# Patient Record
Sex: Female | Born: 1935 | Race: White | Hispanic: No | Marital: Married | Smoking: Former smoker
Health system: Southern US, Community
[De-identification: ages and names within clinical notes are randomized; demographics above are authoritative.]

## PROBLEM LIST (undated history)

## (undated) DIAGNOSIS — I10 Essential (primary) hypertension: Secondary | ICD-10-CM

## (undated) DIAGNOSIS — R079 Chest pain, unspecified: Secondary | ICD-10-CM

## (undated) DIAGNOSIS — N390 Urinary tract infection, site not specified: Secondary | ICD-10-CM

## (undated) DIAGNOSIS — J984 Other disorders of lung: Secondary | ICD-10-CM

## (undated) DIAGNOSIS — R001 Bradycardia, unspecified: Secondary | ICD-10-CM

## (undated) DIAGNOSIS — M1990 Unspecified osteoarthritis, unspecified site: Secondary | ICD-10-CM

## (undated) DIAGNOSIS — M179 Osteoarthritis of knee, unspecified: Secondary | ICD-10-CM

## (undated) DIAGNOSIS — M1710 Unilateral primary osteoarthritis, unspecified knee: Secondary | ICD-10-CM

## (undated) HISTORY — PX: TUBAL LIGATION: SHX77

## (undated) HISTORY — PX: ABDOMINAL HYSTERECTOMY: SHX81

## (undated) HISTORY — PX: TOTAL KNEE ARTHROPLASTY: SHX125

---

## 2001-10-17 ENCOUNTER — Encounter: Payer: Self-pay | Admitting: Orthopedic Surgery

## 2001-10-17 ENCOUNTER — Encounter: Admission: RE | Admit: 2001-10-17 | Discharge: 2001-10-17 | Payer: Self-pay | Admitting: Orthopedic Surgery

## 2001-10-20 ENCOUNTER — Ambulatory Visit (HOSPITAL_BASED_OUTPATIENT_CLINIC_OR_DEPARTMENT_OTHER): Admission: RE | Admit: 2001-10-20 | Discharge: 2001-10-20 | Payer: Self-pay | Admitting: Orthopedic Surgery

## 2002-05-13 ENCOUNTER — Encounter: Admission: RE | Admit: 2002-05-13 | Discharge: 2002-06-05 | Payer: Self-pay

## 2010-08-14 ENCOUNTER — Encounter: Payer: Self-pay | Admitting: Unknown Physician Specialty

## 2015-09-14 ENCOUNTER — Encounter (HOSPITAL_COMMUNITY): Payer: Self-pay | Admitting: Emergency Medicine

## 2015-09-14 ENCOUNTER — Emergency Department (HOSPITAL_COMMUNITY): Payer: Medicare HMO

## 2015-09-14 ENCOUNTER — Emergency Department (HOSPITAL_COMMUNITY)
Admission: EM | Admit: 2015-09-14 | Discharge: 2015-09-15 | Disposition: A | Discharge status: Home / Self Care | Payer: Medicare HMO | Attending: Emergency Medicine | Admitting: Emergency Medicine

## 2015-09-14 DIAGNOSIS — R600 Localized edema: Secondary | ICD-10-CM | POA: Insufficient documentation

## 2015-09-14 DIAGNOSIS — J209 Acute bronchitis, unspecified: Secondary | ICD-10-CM | POA: Diagnosis not present

## 2015-09-14 DIAGNOSIS — Z590 Homelessness: Secondary | ICD-10-CM | POA: Insufficient documentation

## 2015-09-14 DIAGNOSIS — M79662 Pain in left lower leg: Secondary | ICD-10-CM | POA: Diagnosis present

## 2015-09-14 HISTORY — DX: Unspecified osteoarthritis, unspecified site: M19.90

## 2015-09-14 LAB — CBC WITH DIFFERENTIAL/PLATELET
Basophils Absolute: 0 10*3/uL (ref 0.0–0.1)
Basophils Relative: 0 %
Eosinophils Absolute: 0.1 10*3/uL (ref 0.0–0.7)
Eosinophils Relative: 2 %
HCT: 41.6 % (ref 36.0–46.0)
Hemoglobin: 13.1 g/dL (ref 12.0–15.0)
Lymphocytes Relative: 25 %
Lymphs Abs: 1.5 10*3/uL (ref 0.7–4.0)
MCH: 30.7 pg (ref 26.0–34.0)
MCHC: 31.5 g/dL (ref 30.0–36.0)
MCV: 97.4 fL (ref 78.0–100.0)
Monocytes Absolute: 0.9 10*3/uL (ref 0.1–1.0)
Monocytes Relative: 15 %
Neutro Abs: 3.5 10*3/uL (ref 1.7–7.7)
Neutrophils Relative %: 58 %
Platelets: 241 10*3/uL (ref 150–400)
RBC: 4.27 MIL/uL (ref 3.87–5.11)
RDW: 14 % (ref 11.5–15.5)
WBC: 5.9 10*3/uL (ref 4.0–10.5)

## 2015-09-14 LAB — COMPREHENSIVE METABOLIC PANEL
ALT: 13 U/L — ABNORMAL LOW (ref 14–54)
AST: 25 U/L (ref 15–41)
Albumin: 4.1 g/dL (ref 3.5–5.0)
Alkaline Phosphatase: 53 U/L (ref 38–126)
Anion gap: 10 (ref 5–15)
BUN: 13 mg/dL (ref 6–20)
CO2: 26 mmol/L (ref 22–32)
Calcium: 9.3 mg/dL (ref 8.9–10.3)
Chloride: 104 mmol/L (ref 101–111)
Creatinine, Ser: 0.78 mg/dL (ref 0.44–1.00)
GFR calc Af Amer: >60 mL/min (ref >60)
GFR calc non Af Amer: >60 mL/min (ref >60)
Glucose, Bld: 60 mg/dL — ABNORMAL LOW (ref 65–99)
Potassium: 3.6 mmol/L (ref 3.5–5.1)
Sodium: 140 mmol/L (ref 135–145)
Total Bilirubin: 0.6 mg/dL (ref 0.3–1.2)
Total Protein: 7.2 g/dL (ref 6.5–8.1)

## 2015-09-14 LAB — URINALYSIS, DIPSTICK ONLY
Bilirubin Urine: NEGATIVE
Glucose, UA: NEGATIVE mg/dL
Ketones, ur: NEGATIVE mg/dL
Leukocytes, UA: NEGATIVE
Nitrite: NEGATIVE
Protein, ur: NEGATIVE mg/dL
Specific Gravity, Urine: 1.005 (ref 1.005–1.030)
pH: 7 (ref 5.0–8.0)

## 2015-09-14 LAB — BRAIN NATRIURETIC PEPTIDE: B Natriuretic Peptide: 72.4 pg/mL (ref 0.0–100.0)

## 2015-09-14 LAB — I-STAT TROPONIN, ED: Troponin i, poc: 0 ng/mL (ref 0.00–0.08)

## 2015-09-14 MED ORDER — AZITHROMYCIN 250 MG PO TABS: 250.0000 mg | ORAL_TABLET | Freq: Every day | ORAL | Status: DC

## 2015-09-14 MED ORDER — AZITHROMYCIN 250 MG PO TABS
500.0000 mg | ORAL_TABLET | Freq: Once | ORAL | Status: AC
  Administered 2015-09-14: 500 mg via ORAL
  Filled 2015-09-14: qty 2

## 2015-09-14 NOTE — ED Notes (Signed)
Case management at bedside.

## 2015-09-14 NOTE — ED Notes (Signed)
MD at bedside. 

## 2015-09-14 NOTE — ED Provider Notes (Signed)
CSN: [id number]     Arrival date & time 09/14/15  1113 History   First MD Initiated Contact with Patient 09/14/15 1624     Chief Complaint  Patient presents with  . Ankle Pain     Patient is a 82 y.o. female presenting with ankle pain. The history is provided by the patient. No language interpreter was used.  Ankle Pain  YINA RIVIERE is a 81 y.o. female who presents to the Emergency Department complaining of cough, leg swelling. She reports 1 year of bilateral lower extremity edema. The swelling spell worse over the last week. She reports increased pain in bilateral legs due to swelling. She also reports cough and chest congestion since last night. No reports of fevers, chest pain, belly pain, vomiting. She denies any medical history. She takes no medications. Sxs are moderate, constant, worsening..   Past Medical History  Diagnosis Date  . Arthritis    No past surgical history on file. No family history on file. Social History  Substance Use Topics  . Smoking status: None  . Smokeless tobacco: None  . Alcohol Use: No   OB History    No data available     Review of Systems  All other systems reviewed and are negative.     Allergies  Ciprofloxacin hcl; Prednisone; and Zantac  Home Medications   Prior to Admission medications   Medication Sig Start Date End Date Taking? Authorizing Provider  azithromycin (ZITHROMAX) 250 MG tablet Take 1 tablet (250 mg total) by mouth daily. 09/14/15   Tilden Fossa, MD   BP 143/62 mmHg  Pulse 76  Temp(Src) 98 F (36.7 C) (Oral)  Resp 16  SpO2 94% Physical Exam  Constitutional: She is oriented to person, place, and time. She appears well-developed and well-nourished.  HENT:  Head: Normocephalic and atraumatic.  Cardiovascular: Normal rate and regular rhythm.   No murmur heard. Pulmonary/Chest: Effort normal and breath sounds normal. No respiratory distress.  Abdominal: Soft. There is no tenderness. There is no rebound and no  guarding.  Musculoskeletal: She exhibits no tenderness.  1-2+ pitting edema bilateral lower extremities  Neurological: She is alert and oriented to person, place, and time.  Skin: Skin is warm and dry.  Psychiatric: She has a normal mood and affect. Her behavior is normal.  Nursing note and vitals reviewed.   ED Course  Procedures (including critical care time) Labs Review Labs Reviewed  COMPREHENSIVE METABOLIC PANEL - Abnormal; Notable for the following:    Glucose, Bld 60 (*)    ALT 13 (*)    All other components within normal limits  URINALYSIS, DIPSTICK ONLY - Abnormal; Notable for the following:    Hgb urine dipstick TRACE (*)    All other components within normal limits  CBC WITH DIFFERENTIAL/PLATELET  BRAIN NATRIURETIC PEPTIDE  I-STAT TROPOININ, ED    Imaging Review Dg Chest 2 View  09/14/2015  CLINICAL DATA:  Dry cough and shortness of breath beginning last night. Ankle swelling. Initial encounter. EXAM: CHEST  2 VIEW COMPARISON:  None. FINDINGS: The lungs are clear. Heart size is normal. There is no pneumothorax or pleural effusion. No focal bony abnormality. IMPRESSION: No acute disease. Electronically Signed   By: Drusilla Kanner M.D.   On: 09/14/2015 18:13   I have personally reviewed and evaluated these images and lab results as part of my medical decision-making.   EKG Interpretation   Date/Time:  Wednesday September 14 2015 18:26:21 EST Ventricular Rate:  72 PR  Interval:  169 QRS Duration: 87 QT Interval:  409 QTC Calculation: 448 R Axis:   22 Text Interpretation:  Sinus rhythm Low voltage, precordial leads Confirmed  by Lincoln Brigham [phone]) on 09/14/2015 6:43:17 PM      MDM   Final diagnoses:  Acute bronchitis, unspecified organism  Pedal edema    Patient here with cough and congestion, 1 year of chronic bilateral lower extremity edema. She is nontoxic appearing on examination with no respiratory distress. Presentation is not consistent with PE, ACS,  pneumonia. Treating for bronchitis.  In terms of bilateral lower extremity edema, presentation is not consistent with congestive heart failure, PE, cellulitis. Discussed home care, outpatient follow-up, return precautions. Patient is homeless and is unable to return to the shelter tonight, will await social work evaluation in the morning for assistance.    Tilden Fossa, MD 09/15/15 [phone]

## 2015-09-14 NOTE — Discharge Instructions (Signed)
Acute Bronchitis °Bronchitis is inflammation of the airways that extend from the windpipe into the lungs (bronchi). The inflammation often causes mucus to develop. This leads to a cough, which is the most common symptom of bronchitis.  °In acute bronchitis, the condition usually develops suddenly and goes away over time, usually in a couple weeks. Smoking, allergies, and asthma can make bronchitis worse. Repeated episodes of bronchitis may cause further lung problems.  °CAUSES °Acute bronchitis is most often caused by the same virus that causes a cold. The virus can spread from person to person (contagious) through coughing, sneezing, and touching contaminated objects. °SIGNS AND SYMPTOMS  °· Cough.   °· Fever.   °· Coughing up mucus.   °· Body aches.   °· Chest congestion.   °· Chills.   °· Shortness of breath.   °· Sore throat.   °DIAGNOSIS  °Acute bronchitis is usually diagnosed through a physical exam. Your health care provider will also ask you questions about your medical history. Tests, such as chest X-rays, are sometimes done to rule out other conditions.  °TREATMENT  °Acute bronchitis usually goes away in a couple weeks. Oftentimes, no medical treatment is necessary. Medicines are sometimes given for relief of fever or cough. Antibiotic medicines are usually not needed but may be prescribed in certain situations. In some cases, an inhaler may be recommended to help reduce shortness of breath and control the cough. A cool mist vaporizer may also be used to help thin bronchial secretions and make it easier to clear the chest.  °HOME CARE INSTRUCTIONS °· Get plenty of rest.   °· Drink enough fluids to keep your urine clear or pale yellow (unless you have a medical condition that requires fluid restriction). Increasing fluids may help thin your respiratory secretions (sputum) and reduce chest congestion, and it will prevent dehydration.   °· Take medicines only as directed by your health care provider. °· If  you were prescribed an antibiotic medicine, finish it all even if you start to feel better. °· Avoid smoking and secondhand smoke. Exposure to cigarette smoke or irritating chemicals will make bronchitis worse. If you are a smoker, consider using nicotine gum or skin patches to help control withdrawal symptoms. Quitting smoking will help your lungs heal faster.   °· Reduce the chances of another bout of acute bronchitis by washing your hands frequently, avoiding people with cold symptoms, and trying not to touch your hands to your mouth, nose, or eyes.   °· Keep all follow-up visits as directed by your health care provider.   °SEEK MEDICAL CARE IF: °Your symptoms do not improve after 1 week of treatment.  °SEEK IMMEDIATE MEDICAL CARE IF: °· You develop an increased fever or chills.   °· You have chest pain.   °· You have severe shortness of breath. °· You have bloody sputum.   °· You develop dehydration. °· You faint or repeatedly feel like you are going to pass out. °· You develop repeated vomiting. °· You develop a severe headache. °MAKE SURE YOU:  °· Understand these instructions. °· Will watch your condition. °· Will get help right away if you are not doing well or get worse. °  °This information is not intended to replace advice given to you by your health care provider. Make sure you discuss any questions you have with your health care provider. °  °Document Released: 08/16/2004 Document Revised: 07/30/2014 Document Reviewed: 12/30/2012 °Elsevier Interactive Patient Education ©2016 Elsevier Inc. ° °Emergency Department Resource Guide °1) Find a Doctor and Pay Out of Pocket °Although you won't have to   find out who is covered by your insurance plan, it is a good idea to ask around and get recommendations. You will then need to call the office and see if the doctor you have chosen will accept you as a new patient and what types of options they offer for patients who are self-pay. Some doctors offer discounts or  will set up payment plans for their patients who do not have insurance, but you will need to ask so you aren't surprised when you get to your appointment. ° °2) Contact Your Local Health Department °Not all health departments have doctors that can see patients for sick visits, but many do, so it is worth a call to see if yours does. If you don't know where your local health department is, you can check in your phone book. The CDC also has a tool to help you locate your state's health department, and many state websites also have listings of all of their local health departments. ° °3) Find a Walk-in Clinic °If your illness is not likely to be very severe or complicated, you may want to try a walk in clinic. These are popping up all over the country in pharmacies, drugstores, and shopping centers. They're usually staffed by nurse practitioners or physician assistants that have been trained to treat common illnesses and complaints. They're usually fairly quick and inexpensive. However, if you have serious medical issues or chronic medical problems, these are probably not your best option. ° °No Primary Care Doctor: °- Call [facility] at  832-8000 - they can help you locate a primary care doctor that  accepts your insurance, provides certain services, etc. °- Physician Referral Service- 1-800-533-3463 ° °Chronic Pain Problems: °Organization         Address  Phone   Notes  ° [facility]  (336) 297-2271 Patients need to be referred by their primary care doctor.  ° °Medication Assistance: °Organization         Address  Phone   Notes  °[place] [facility] [address] °[place], [place] [address] (336) 641-8030 --Must be a resident of [place] °-- Must have NO insurance coverage whatsoever (no Medicaid/ Medicare, etc.) °-- The pt. MUST have a primary care doctor that directs their care regularly and follows them in the community °  °[facility]  (866)  331-1348   °[employer]  (888) 892-1162   ° °Agencies that provide inexpensive medical care: °Organization         Address  Phone   Notes  °[facility]  (336) 832-8035   °[facility]    (336) 832-7272   °[facility] [address] °[place], [place] [address] (336) 832-4777   °[facility] [address], °[place] (336) 271-4999   °[facility]    (336) 373-0678   °[facility]    (336) 272-1050   °[facility] ° [address], [place] Phone:  (336) 832-4444, Fax:  (336) 832-4440 Hours of Operation:  9 am - 6 pm, M-F.  Also accepts Medicaid/Medicare and self-pay.  °[facility] ° [address], [place] Phone: (336) 832-3150, Fax: (336) [phone]. Hours of Operation:  8:30 am - 5:30 pm, M-F.  Also accepts Medicaid and self-pay.  °[facility] [place] [address], [place] Phone: (336) 878-6027   °[facility] [address], [place], [place] (336)723-1848, Ext. 123 Mondays & Thursdays: 7-9 AM.  First 15 patients are seen on a first come, first serve basis. °  ° °Medicaid-accepting [place] Providers: ° °Organization         Address  Phone   Notes  °[facility] [address], [place] (336) 641-2100 Also accepts self-pay patients.  °[facility] [address], [place] ° (336) 856-9996   °[facility] [address], [place] (336) 288-8857   °[facility] [address] [place] [address], [place] (336) 299-7000   °Veita Bland [address], [place]  ° (336) 373-1557 Only accepts [place] Access Medicaid patients after they have their name applied to their card.  ° °Self-Pay (no insurance) in [place]: ° °Organization         Address  Phone   Notes  °Sickle Cell Patients, [facility] [address], [place] (336) 832-1970   °[facility] [address], [place] (336) 832-4400   °[facility] ° [address], [place] (336) 992-4800   °[facility]/Dr. Osei-Bonsu ° [address] [place] [address], [place] or [address], [place] (336) 841-8500 Phone number for both [place] and [place] locations is the same.  °[facility] [address], [place] (336) 299-0000   °[facility] [address] [place] [address], [place] or [address] (336) 852-7530 °(336) 878-2260   °[facility] [address], [place] (336) 350-1642, phone; (336) 294-5005, fax Sees patients 1st and 3rd Saturday of every month.  Must not qualify for public or private insurance (i.e. Medicaid, Medicare, [place] Health Choice, Veterans' Benefits) • Household income should be no more than 200% of the poverty level •The clinic cannot treat you if you are pregnant or think you are pregnant • Sexually transmitted diseases are not treated at the clinic.  ° ° °Dental Care: °Organization         Address  Phone  Notes  °[facility] [facility] [address], [place] (336) 641-6152 Accepts children up to age 21 who are enrolled in Medicaid or [place] Health Choice; pregnant women with a Medicaid card; and children who have applied for Medicaid or [place] Health Choice, but were declined, whose parents can pay a reduced fee at time of service.  °[facility] [place]  [address], [place] (336) 641-7733 Accepts children up to age 21 who are enrolled in Medicaid or [place] Health Choice; pregnant women with a Medicaid card; and children who have applied for Medicaid or [place] Health Choice, but were declined, whose parents can pay a reduced fee at time of service.  °[facility] ° [address], [place] (336)  641-4533 Patients are seen by appointment only. Walk-ins are not accepted. [facility] will see patients 18 years of age and older. °Monday - Tuesday (8am-5pm) °Most Wednesdays (8:30-5pm) °$30 per visit, cash only  °[facility] ° [address], [place] (336) 641-4533 Patients are seen by appointment only. Walk-ins are not accepted. [facility] will see patients 18 years of age and older. °One Wednesday Evening (Monthly: Volunteer Based).  $30 per visit, cash only  °[facility]  (  919) 537-3737 for adults; Children under age 4, call Graduate Pediatric Dentistry at (919) [phone]. Children aged 4-14, please call (919) [phone] to request a pediatric application. ° Dental services are provided in all areas of dental care including fillings, crowns and bridges, complete and partial dentures, implants, gum treatment, root canals, and extractions. Preventive care is also provided. Treatment is provided to both adults and children. °Patients are selected via a lottery and there is often a waiting list. °  °[facility] [address], °[place] ° (336) 763-8833 [email or web address] °  °[facility] [address], [place], [place] (336)723-1848, Ext. 123 Second and Fourth Thursday of each month, opens at 6:30 AM; Clinic ends at 9 AM.  Patients are seen on a first-come first-served basis, and a limited number are seen during each clinic.  ° °[facility] ° [address], [place], [place] (336) 723-7904   Eligibility Requirements °You must have lived in [place], or [place] counties for at least the last three months. °  You cannot be eligible for state or federal sponsored healthcare insurance, including Veterans Administration, Medicaid, or Medicare. °  You generally cannot be eligible for healthcare insurance through your employer.  °  How to apply: °Eligibility screenings are held every Tuesday and Wednesday afternoon  from 1:00 pm until 4:00 pm. You do not need an appointment for the interview!  °[facility] [address], [place], [place] 336-631-2330   °[facility]  336-342-8273   °[facility]  336-703-3100   °[facility]  336-570-6415   ° °Behavioral Health Resources in the Community: °Intensive Outpatient Programs °Organization         Address  Phone  Notes  °[facility] [address], [place], [place] 336-878-6098   °[facility] [address], [place], [place] 336-832-9800   °[facility] [address], [place], [place] ° 336-882-2125   °[facility] [address],  °[place], [place] 1-800-853-5163 or 336-641-4981   °Substance Abuse Resources °Organization         Address  Phone  Notes  °[facility]  336-882-2125   °[facility]  336-784-9470   °[facility]  336-285-9073   °[facility]  336-845-3988   °Residential & Outpatient Substance Abuse Program  1-800-659-3381   °Psychological Services °Organization         Address  Phone  Notes  °[facility]  336- 832-9600   °[facility]  336- 378-7881   °[facility] [address], [place] 1-800-853-5163 or 336-641-4981   ° °Mobile Crisis Teams °Organization         Address  Phone  Notes  °[facility], Mobile Crisis Care Unit  1-877-626-1772   °Assertive °[facility] ° [address] [place], [place] 336-834-9664   °Sharon DeEsch [address] °[place] 336-554-5454   ° °Self-Help/Support Groups °Organization         Address  Phone             Notes  °[facility] - variety of support groups  336- 373-1402 Call for more information  °Narcotics Anonymous (NA), [facility] [address], °[place]  2 meetings at this location  ° °Residential Treatment  Programs °Organization         Address  Phone    Notes  °[facility] [address],    °[place]  1-866-801-8205   °[facility] ° [address], [place], [place] 704-293-8524   °[facility] [address], [place] 336-845-3988 Admissions: 8am-3pm M-F  °[facility] [address]    °[place], [place] 336-841-1104   °[facility] [address], [place], [place] 336-379-7146   °[facility] [address]  °[place], [place] 336-285-9073   °[facility] - Intensive Outpatient [address], [place], [place] 336-852-3033   °[facility] [address]  °[place], [place] 1-877-615-2722 or 336-784-9470   °[facility] [address] [place], [place] 336-227-7417 Accepts Medicaid  °[facility] [address]  °[place] 1-800-659-3381 Substance Abuse/Addiction Treatment  ° °[place] Behavioral Health Resources °Organization         Address  Phone  Notes  °[facility]  (888) 581-9988   °Julie Brannon, PhD [address] [place], [place]   (336) 349-5553 or (336) 951-0000   °[facility]   [address] °[place], [place] (336) 349-4454   °[facility] [address], [place], [place] (336) 342-8316 Insurance/Medicaid/sponsorship through [facility]  °[facility] [address]                                    [place], [place] (336) 342-8316 Therapy/tele-psych/case  °[facility] [address]  ° [place],  (336) 349-2233    °Dr. Arfeen  (336) 349-4544   °[facility] [place]  [employer] [place] [facility] 1) [address],  °2) [address], [place] °3)  [address], [place] (336) 349-3220 °(336) 342-7768 ° °(336) 342-8140   °[place] Child Abuse [facility] (336) 342-1394 or (336) 342-3537 (After Hours)    ° ° °

## 2015-09-14 NOTE — ED Notes (Signed)
Per EMS: [facility] from [facility] [address].  Pt is homeless.  C/o bilateral ankle swelling, peeling, hot to touch, redness.  Pitting edema.

## 2015-09-14 NOTE — Progress Notes (Addendum)
EDCM called for EDSW consult for homeless patient needing pcp, community resources.  EDCM spoke to patient at bedside.  Patient reports she has been homeless for one year, has stayed at multiple shelters.  Patient reports she is staying at the [facility] on [address].  She reports the name of the facility has the name [place] in it.  Patient has [employer].  Patient isware of the [facility], but reports it is difficult for her to get there.  Patient reports she has Medicaid "but they would not give me disability."  Patient reports she gets a SS check for 674 dollars a month.  EDCM informed patient if she has disability she will need to get in touch with her SW from DSS to see if they can assist her in getting into an ALF.  Patient is unsure if she has a Child psychotherapist at the shelter she is staying at now.  EDCM also provided patient with phone number for Medicaid transport, and [facility].  [facility] provided patient with contact information for the [facility].  Discussed patient with EDP and EDRN.  Patient will probably need cab voucher to get back to her shelter.  EDCM unable to find phone number to shelter.  EDCM called AD for SW department. Awaiting call back.  Discussed with charge RN.  EDCM also provided patient with contact information for local food pantries and shelters.  List of indigent clinics also given to patient.  No further EDCM  Needs at this time.  Discussed patient with EDP.  EDCM called information for number to [facility] shelter for women, however they do not have one.  EDCM called the no emergency line [phone] who provided phone number [phone] which is a non working number.  Discussed with ED charge RN.  Due to patient not having safe disposition, will stay overnight and have EDSW see patient in AM.

## 2015-09-15 MED ORDER — ADULT MULTIVITAMIN W/MINERALS CH
1.0000 | ORAL_TABLET | Freq: Once | ORAL | Status: AC
  Administered 2015-09-15: 1 via ORAL
  Filled 2015-09-15: qty 1

## 2015-09-15 MED ORDER — IBUPROFEN 800 MG PO TABS
800.0000 mg | ORAL_TABLET | Freq: Once | ORAL | Status: AC
  Administered 2015-09-15: 800 mg via ORAL
  Filled 2015-09-15: qty 1

## 2015-09-15 NOTE — Progress Notes (Addendum)
Pt is very appreciative of anything done for her. Pt was given a cup of coffee and juice . Pt c/o swelling to both ankles. EDP made aware. Pt is not in any resp. Distress. Pt asked for a vitamin pill and a verbal order was obtained. Pt denies any chest pain.pt ate 75 % of her breakfast. Charge nurse made aware pt will be discharged once social work completes all the paperwork. Pt will go to the [facility] via a cab. (10:35am )Dr Estell Harpin saw the pt. 11:10am -_pt will get  of motrin for foot pain. Pt does have a positice pedal pulse. Pt is angry that she is leaving.pt stated her feet have hurt times 3 years. No abrasions noted. Dr. Estell Harpin wrote the pt a RX for Naprosyn. Pt stated her husband of 42 years walked out and left her destitute. Pt has been homeless for three  Years.

## 2015-09-15 NOTE — Progress Notes (Signed)
CSW staffed with nurse. CSW spoke with patient at bedside with no family present. Patient reports she was evicted over one year ago because her husband walked out on her and he would not pay the mortgage. Patient reports they were married for forty-two years. Patient reports she was arrested because she could not pay the mortgage. Patient reports the judge allowed her husband to have their truck and she stated she was left "deserted".   Patient reports she has been to various shelters and she does not like the way she has been treated. Patient reports the last shelter she was in was in [place]. Patient reports she has received help from [facility], [place]. Patient reports she has also lived in a shelter named "[address]" on [place], [place]. Patient reports she would like a one bedroom apartment.   CSW staffed case with Asst. Social Work Interior and spatial designer. CSW was informed to see if patient is medically cleared and if so, to provide resources with assistance back to the shelter. CSW staffed with nurse and nurse reports patient has been medically cleared. CSW spoke with Asst. Social Work Interior and spatial designer to see if patient would be allowed to take a cab for transportation. He approved for patient to be transported by cab. CSW gave nurse the cab voucher and CSW provided resource information to patient for the [facility] and various homeless shelters.   Elenore Paddy [phone] ED CSW 09/15/2015 12:49 PM

## 2015-10-18 DIAGNOSIS — Z5900 Homelessness unspecified: Secondary | ICD-10-CM

## 2015-10-20 NOTE — Congregational Nurse Program (Unsigned)
Congregational Nurse Program Note  Date of Encounter: 10/18/2015  Past Medical History: No past medical history on file.  Encounter Details:     CNP Questionnaire - 10/18/15 0852    Patient Demographics   Is this a new or existing patient? New   Patient is considered a/an Not Applicable   Race Caucasian/White   Patient Assistance   Location of Patient Assistance Not Applicable   Patient's financial/insurance status Medicaid;Medicare   Uninsured Patient No   Patient referred to apply for the following financial assistance Not Applicable   Food insecurities addressed Provided food supplies   Transportation assistance No   Educational health offerings Chronic disease;Hypertension   Encounter Details   Primary purpose of visit Chronic Illness/Condition Visit;Education/Health Concerns   Was an Emergency Department visit averted? Not Applicable   Does patient have a medical provider? No   Patient referred to Clinic   Was a mental health screening completed? (GAINS tool) No   Does patient have dental issues? No   Does patient have vision issues? No   Since previous encounter, have you referred patient for abnormal blood pressure that resulted in a new diagnosis or medication change? No   Since previous encounter, have you referred patient for abnormal blood glucose that resulted in a new diagnosis or medication change? No   For Abstraction Use Only   Does patient have insurance? Yes       B/P check

## 2015-11-20 ENCOUNTER — Emergency Department (HOSPITAL_COMMUNITY): Payer: Medicare HMO

## 2015-11-20 ENCOUNTER — Encounter (HOSPITAL_COMMUNITY): Payer: Self-pay | Admitting: Emergency Medicine

## 2015-11-20 ENCOUNTER — Emergency Department (HOSPITAL_COMMUNITY)
Admission: EM | Admit: 2015-11-20 | Discharge: 2015-11-20 | Disposition: A | Discharge status: Home / Self Care | Payer: Medicare HMO | Attending: Emergency Medicine | Admitting: Emergency Medicine

## 2015-11-20 DIAGNOSIS — Y999 Unspecified external cause status: Secondary | ICD-10-CM | POA: Diagnosis not present

## 2015-11-20 DIAGNOSIS — Y939 Activity, unspecified: Secondary | ICD-10-CM | POA: Insufficient documentation

## 2015-11-20 DIAGNOSIS — S39012A Strain of muscle, fascia and tendon of lower back, initial encounter: Secondary | ICD-10-CM | POA: Insufficient documentation

## 2015-11-20 DIAGNOSIS — Y9289 Other specified places as the place of occurrence of the external cause: Secondary | ICD-10-CM | POA: Insufficient documentation

## 2015-11-20 DIAGNOSIS — W19XXXA Unspecified fall, initial encounter: Secondary | ICD-10-CM

## 2015-11-20 DIAGNOSIS — W101XXA Fall (on)(from) sidewalk curb, initial encounter: Secondary | ICD-10-CM | POA: Diagnosis not present

## 2015-11-20 DIAGNOSIS — M1990 Unspecified osteoarthritis, unspecified site: Secondary | ICD-10-CM | POA: Diagnosis not present

## 2015-11-20 DIAGNOSIS — S91341A Puncture wound with foreign body, right foot, initial encounter: Secondary | ICD-10-CM | POA: Diagnosis not present

## 2015-11-20 DIAGNOSIS — R1030 Lower abdominal pain, unspecified: Secondary | ICD-10-CM | POA: Diagnosis present

## 2015-11-20 LAB — CBC WITH DIFFERENTIAL/PLATELET
BASOS ABS: 0 10*3/uL (ref 0.0–0.1)
Basophils Relative: 0 %
Eosinophils Absolute: 0.2 10*3/uL (ref 0.0–0.7)
Eosinophils Relative: 2 %
HEMATOCRIT: 39.4 % (ref 36.0–46.0)
Hemoglobin: 13.6 g/dL (ref 12.0–15.0)
LYMPHS PCT: 25 %
Lymphs Abs: 2.8 10*3/uL (ref 0.7–4.0)
MCH: 32.9 pg (ref 26.0–34.0)
MCHC: 34.5 g/dL (ref 30.0–36.0)
MCV: 95.4 fL (ref 78.0–100.0)
Monocytes Absolute: 0.8 10*3/uL (ref 0.1–1.0)
Monocytes Relative: 7 %
NEUTROS ABS: 7.3 10*3/uL (ref 1.7–7.7)
Neutrophils Relative %: 66 %
Platelets: 250 10*3/uL (ref 150–400)
RBC: 4.13 MIL/uL (ref 3.87–5.11)
RDW: 12.9 % (ref 11.5–15.5)
WBC: 11 10*3/uL — AB (ref 4.0–10.5)

## 2015-11-20 LAB — COMPREHENSIVE METABOLIC PANEL
ALBUMIN: 3.8 g/dL (ref 3.5–5.0)
ALT: 14 U/L (ref 14–54)
ANION GAP: 9 (ref 5–15)
AST: 20 U/L (ref 15–41)
Alkaline Phosphatase: 51 U/L (ref 38–126)
BILIRUBIN TOTAL: 0.5 mg/dL (ref 0.3–1.2)
BUN: 11 mg/dL (ref 6–20)
CHLORIDE: 110 mmol/L (ref 101–111)
CO2: 26 mmol/L (ref 22–32)
Calcium: 9.1 mg/dL (ref 8.9–10.3)
Creatinine, Ser: 0.75 mg/dL (ref 0.44–1.00)
GFR calc Af Amer: >60 mL/min (ref >60)
GLUCOSE: 104 mg/dL — AB (ref 65–99)
POTASSIUM: 3.5 mmol/L (ref 3.5–5.1)
Sodium: 145 mmol/L (ref 135–145)
TOTAL PROTEIN: 6.8 g/dL (ref 6.5–8.1)

## 2015-11-20 LAB — URINALYSIS, ROUTINE W REFLEX MICROSCOPIC
Bilirubin Urine: NEGATIVE
GLUCOSE, UA: NEGATIVE mg/dL
Ketones, ur: NEGATIVE mg/dL
LEUKOCYTES UA: NEGATIVE
Nitrite: NEGATIVE
PH: 8 (ref 5.0–8.0)
Protein, ur: NEGATIVE mg/dL
SPECIFIC GRAVITY, URINE: 1.018 (ref 1.005–1.030)

## 2015-11-20 LAB — URINE MICROSCOPIC-ADD ON

## 2015-11-20 MED ORDER — LIDOCAINE HCL (PF) 1 % IJ SOLN
2.0000 mL | Freq: Once | INTRAMUSCULAR | Status: AC
  Administered 2015-11-20: 2 mL
  Filled 2015-11-20: qty 5

## 2015-11-20 NOTE — ED Provider Notes (Signed)
CSN: [id number]     Arrival date & time 11/20/15  4098 History   First MD Initiated Contact with Patient 11/20/15 [phone]     Chief Complaint  Patient presents with  . Fall  . Flank Pain     (Consider location/radiation/quality/duration/timing/severity/associated sxs/prior Treatment) HPI Patient fell Friday. 2 days ago. She reports that she was standing at a curb and lost her balance. She fell onto a grass surface. At the time she did not think she had significant injury. She reports one of her neighbors helped her get up and she wonders if he might of pulled her too hard and strained her in the back. She reports that yesterday she started feeling some aching and tightness in her lower back. She assumed it was due to "old age". She then recalled that she had fallen the day before. This morning she states that she felt like her lower abdomen was bloated and uncomfortable almost like period cramps. She reports it feels improved now. She denies pain burning or urgency with urination. No vomiting or diarrhea. No gait difficulty. No headache.  A second complaint, patient reports about 2 weeks ago she stepped on some glass that she had cleaned up. She reports that she feels something there on the surface of her foot but she can't get it out. Past Medical History  Diagnosis Date  . Arthritis    No past surgical history on file. No family history on file. Social History  Substance Use Topics  . Smoking status: None  . Smokeless tobacco: None  . Alcohol Use: No   OB History    No data available     Review of Systems 10 Systems reviewed and are negative for acute change except as noted in the HPI.    Allergies  Ciprofloxacin hcl; Prednisone; and Zantac  Home Medications   Prior to Admission medications   Not on File   BP 116/63 mmHg  Pulse 54  Temp(Src) 98.7 F (37.1 C) (Oral)  Resp 20  SpO2 100% Physical Exam  Constitutional: She is oriented to person, place, and time. She  appears well-developed and well-nourished. No distress.  Patient is in good physical condition for age. No respiratory distress. Clear mental status.  HENT:  Head: Normocephalic and atraumatic.  Right Ear: External ear normal.  Left Ear: External ear normal.  Nose: Nose normal.  Mouth/Throat: Oropharynx is clear and moist.  Eyes: EOM are normal. Pupils are equal, round, and reactive to light.  Neck: Neck supple.  Cardiovascular: Normal rate, regular rhythm, normal heart sounds and intact distal pulses.   Pulmonary/Chest: Effort normal and breath sounds normal. She exhibits no tenderness.  Abdominal: Soft. Bowel sounds are normal. She exhibits no distension. There is no tenderness.  Musculoskeletal: Normal range of motion. She exhibits no edema or tenderness.  No bony point tenderness over the cervical spine. Patient endorses discomfort in the soft tissues bilateral paraspinous muscle bodies lumbar spine.  Sole of the right foot at approximately the central point of the forefoot, has a proximally 2 mm opening and a small rough palpable edge. No signs of infection of the forefoot.  Neurological: She is alert and oriented to person, place, and time. She has normal strength. No cranial nerve deficit. She exhibits normal muscle tone. Coordination normal. GCS eye subscore is 4. GCS verbal subscore is 5. GCS motor subscore is 6.  Skin: Skin is warm, dry and intact.  Psychiatric: She has a normal mood and affect.  ED Course  .Foreign Body Removal Date/Time: 11/20/2015 11:56 AM Performed by: Arby BarrettePFEIFFER, Keyaan Lederman Authorized by: Arby BarrettePFEIFFER, Nafeesah Lapaglia Body area: skin General location: lower extremity Location details: right foot Anesthesia: local infiltration Local anesthetic: lidocaine 1% without epinephrine Anesthetic total: 1 ml Localization method: probed Removal mechanism: scalpel Dressing: dressing applied Depth: subcutaneous Complexity: simple 1 objects recovered. Objects recovered: Irregular  glass approximately 3 mm Post-procedure assessment: foreign body removed   (including critical care time)  Labs Review Labs Reviewed  COMPREHENSIVE METABOLIC PANEL - Abnormal; Notable for the following:    Glucose, Bld 104 (*)    All other components within normal limits  CBC WITH DIFFERENTIAL/PLATELET - Abnormal; Notable for the following:    WBC 11.0 (*)    All other components within normal limits  URINALYSIS, ROUTINE W REFLEX MICROSCOPIC (NOT AT [facility]) - Abnormal; Notable for the following:    APPearance CLOUDY (*)    Hgb urine dipstick TRACE (*)    All other components within normal limits  URINE MICROSCOPIC-ADD ON - Abnormal; Notable for the following:    Squamous Epithelial / LPF 6-30 (*)    Bacteria, UA FEW (*)    All other components within normal limits    Imaging Review Dg Chest 2 View  11/20/2015  CLINICAL DATA:  Pain status post recent fall. EXAM: CHEST  2 VIEW COMPARISON:  Chest x-ray dated 09/14/2015. FINDINGS: Heart size is normal. Overall cardiomediastinal silhouette is stable in size and configuration. Atherosclerotic changes again noted at the aortic arch. Lungs are at least mildly hyperexpanded suggesting COPD. Lungs are clear. No pleural effusion or pneumothorax seen. Osseous structures about the chest are unremarkable. Mild degenerative change again noted within the thoracic spine. IMPRESSION: 1. Lungs appear hyperexpanded suggesting COPD/emphysema. 2. No acute findings.  Lungs are clear. Electronically Signed   By: Bary RichardStan  Maynard M.D.   On: 11/20/2015 09:59   Dg Thoracic Spine 2 View  11/20/2015  CLINICAL DATA:  Lower back and flank pain today per pt; pt states that she fell EXAM: THORACIC SPINE 2 VIEWS COMPARISON:  None. FINDINGS: No paraspinous hematoma. Osteophytes throughout the thoracic spine. Normal anterior-posterior alignment. No fracture. IMPRESSION: No acute findings. Electronically Signed   By: Esperanza Heiraymond  Rubner M.D.   On: 11/20/2015 09:54   Dg Lumbar Spine  Complete  11/20/2015  CLINICAL DATA:  Low back and flank pain today, status post recent fall. EXAM: LUMBAR SPINE - COMPLETE 4+ VIEW COMPARISON:  None. FINDINGS: Degenerative changes throughout the lumbar spine and lower thoracic spine, mild to moderate in degree, with associated disc space narrowings and osseous spurring. Osseous alignment is normal. No fracture line or displaced fracture fragment identified. No acute or suspicious osseous lesion. Atherosclerotic changes are seen along the walls of the infrarenal abdominal aorta. Visualized paravertebral soft tissues are otherwise unremarkable. IMPRESSION: 1. No acute findings. 2. Degenerative changes, mild to moderate in degree. Electronically Signed   By: Bary RichardStan  Maynard M.D.   On: 11/20/2015 09:55   Dg Foot Complete Right  11/20/2015  CLINICAL DATA:  Pain, recent fall. EXAM: RIGHT FOOT COMPLETE - 3+ VIEW COMPARISON:  None. FINDINGS: There is diffuse osteopenia which limits characterization of osseous detail, however, there is no fracture line or displaced fracture fragment identified. Overall osseous alignment is normal. No acute or suspicious osseous lesion. Soft tissues about the right foot are unremarkable. Incidental note made of enthesopathy at the posterior and plantar margins of the calcaneus. IMPRESSION: 1. No acute findings.  No osseous fracture or dislocation. 2.  Osteopenia. Electronically Signed   By: Bary Richard M.D.   On: 11/20/2015 09:57   I have personally reviewed and evaluated these images and lab results as part of my medical decision-making.   EKG Interpretation None      MDM   Final diagnoses:  Fall, initial encounter  Lumbosacral strain, initial encounter  Puncture wound of foot with foreign body, right, initial encounter   Patient fell possibly 2 days ago and has had low back discomfort from about her lower rib cage back/flanks to her hips. Patient has had no decrease in mobility or activity. No neurologic dysfunction.  X-rays do not show compression fracture or other occult fracture. Lungs are inflated bilaterally. No lower rib cage fractures. Urinalysis is negative without blood or UTI. Patient had previously stepped on a piece of glass which is present for over 2 weeks. No signs of any secondary infection. Foreign body was removed as outlined.    Arby Barrette, MD 11/20/15 1200

## 2015-11-20 NOTE — ED Notes (Signed)
Bed: [id number] Expected date: 11/20/15 Expected time: 7:42 AM Means of arrival: Ambulance Comments: Fall

## 2015-11-20 NOTE — ED Notes (Signed)
Pt reports bilateral flank pain 5/10 with no dysuria. Pt adds that she has had a piece of glass in her R foot for 1 month

## 2015-11-20 NOTE — Discharge Instructions (Signed)
Lumbosacral Strain Lumbosacral strain is a strain of any of the parts that make up your lumbosacral vertebrae. Your lumbosacral vertebrae are the bones that make up the lower third of your backbone. Your lumbosacral vertebrae are held together by muscles and tough, fibrous tissue (ligaments).  CAUSES  A sudden blow to your back can cause lumbosacral strain. Also, anything that causes an excessive stretch of the muscles in the low back can cause this strain. This is typically seen when people exert themselves strenuously, fall, lift heavy objects, bend, or crouch repeatedly. RISK FACTORS  Physically demanding work.  Participation in pushing or pulling sports or sports that require a sudden twist of the back (tennis, golf, baseball).  Weight lifting.  Excessive lower back curvature.  Forward-tilted pelvis.  Weak back or abdominal muscles or both.  Tight hamstrings. SIGNS AND SYMPTOMS  Lumbosacral strain may cause pain in the area of your injury or pain that moves (radiates) down your leg.  DIAGNOSIS Your health care provider can often diagnose lumbosacral strain through a physical exam. In some cases, you may need tests such as X-ray exams.  TREATMENT  Treatment for your lower back injury depends on many factors that your clinician will have to evaluate. However, most treatment will include the use of anti-inflammatory medicines. HOME CARE INSTRUCTIONS   Avoid hard physical activities (tennis, racquetball, waterskiing) if you are not in proper physical condition for it. This may aggravate or create problems.  If you have a back problem, avoid sports requiring sudden body movements. Swimming and walking are generally safer activities.  Maintain good posture.  Maintain a healthy weight.  For acute conditions, you may put ice on the injured area.  Put ice in a plastic bag.  Place a towel between your skin and the bag.  Leave the ice on for 20 minutes, 2-3 times a day.  When the  low back starts healing, stretching and strengthening exercises may be recommended. SEEK MEDICAL CARE IF:  Your back pain is getting worse.  You experience severe back pain not relieved with medicines. SEEK IMMEDIATE MEDICAL CARE IF:   You have numbness, tingling, weakness, or problems with the use of your arms or legs.  There is a change in bowel or bladder control.  You have increasing pain in any area of the body, including your belly (abdomen).  You notice shortness of breath, dizziness, or feel faint.  You feel sick to your stomach (nauseous), are throwing up (vomiting), or become sweaty.  You notice discoloration of your toes or legs, or your feet get very cold. MAKE SURE YOU:   Understand these instructions.  Will watch your condition.  Will get help right away if you are not doing well or get worse.   This information is not intended to replace advice given to you by your health care provider. Make sure you discuss any questions you have with your health care provider.   Document Released: 04/18/2005 Document Revised: 07/30/2014 Document Reviewed: 02/25/2013 Elsevier Interactive Patient Education 2016 Elsevier Inc. Puncture Wound A puncture wound is an injury that is caused by a sharp, thin object that goes through (penetrates) your skin. Usually, a puncture wound does not leave a large opening in your skin, so it may not bleed a lot. However, when you get a puncture wound, dirt or other materials (foreign bodies) can be forced into your wound and break off inside. This increases the chance of infection, such as tetanus. CAUSES Puncture wounds are caused by any  sharp, thin object that goes through your skin, such as:  Animal teeth, as with an animal bite.  Sharp, pointed objects, such as nails, splinters of glass, fishhooks, and needles. SYMPTOMS Symptoms of a puncture wound include:  Pain.  Bleeding.  Swelling.  Bruising.  Fluid leaking from the  wound.  Numbness, tingling, or loss of function. DIAGNOSIS This condition is diagnosed with a medical history and physical exam. Your wound will be checked to see if it contains any foreign bodies. You may also have X-rays or other imaging tests. TREATMENT Treatment for a puncture wound depends on how serious the wound is. It also depends on whether the wound contains any foreign bodies. Treatment for all types of puncture wounds usually starts with:  Controlling the bleeding.  Washing out the wound with a germ-free (sterile) salt-water solution.  Checking the wound for foreign bodies. Treatment may also include:  Having the wound opened surgically to remove a foreign object.  Closing the wound with stitches (sutures) if it continues to bleed.  Covering the wound with antibiotic ointments and a bandage (dressing).  Receiving a tetanus shot.  Receiving a rabies vaccine. HOME CARE INSTRUCTIONS Medicines  Take or apply over-the-counter and prescription medicines only as told by your health care provider.  If you were prescribed an antibiotic, take or apply it as told by your health care provider. Do not stop using the antibiotic even if your condition improves. Wound Care  There are many ways to close and cover a wound. For example, a wound can be covered with sutures, skin glue, or adhesive strips. Follow instructions from your health care provider about:  How to take care of your wound.  When and how you should change your dressing.  When you should remove your dressing.  Removing whatever was used to close your wound.  Keep the dressing dry as told by your health care provider. Do not take baths, swim, use a hot tub, or do anything that would put your wound underwater until your health care provider approves.  Clean the wound as told by your health care provider.  Do not scratch or pick at the wound.  Check your wound every day for signs of infection. Watch  for:  Redness, swelling, or pain.  Fluid, blood, or pus. General Instructions  Raise (elevate) the injured area above the level of your heart while you are sitting or lying down.  If your puncture wound is in your foot, ask your health care provider if you need to avoid putting weight on your foot and for how long.  Keep all follow-up visits as told by your health care provider. This is important. SEEK MEDICAL CARE IF:  You received a tetanus shot and you have swelling, severe pain, redness, or bleeding at the injection site.  You have a fever.  Your sutures come out.  You notice a bad smell coming from your wound or your dressing.  You notice something coming out of your wound, such as wood or glass.  Your pain is not controlled with medicine.  You have increased redness, swelling, or pain at the site of your wound.  You have fluid, blood, or pus coming from your wound.  You notice a change in the color of your skin near your wound.  You need to change the dressing frequently due to fluid, blood, or pus draining from your wound.  You develop a new rash.  You develop numbness around your wound. SEEK IMMEDIATE MEDICAL CARE  IF:  You develop severe swelling around your wound.  Your pain suddenly increases and is severe.  You develop painful skin lumps.  You have a red streak going away from your wound.  The wound is on your hand or foot and you cannot properly move a finger or toe.  The wound is on your hand or foot and you notice that your fingers or toes look pale or bluish.   This information is not intended to replace advice given to you by your health care provider. Make sure you discuss any questions you have with your health care provider.   Document Released: 04/18/2005 Document Revised: 03/30/2015 Document Reviewed: 09/01/2014 Elsevier Interactive Patient Education [employer]2016 Elsevier Inc.

## 2015-11-20 NOTE — ED Notes (Signed)
Pt from home via EMS- Per EMS pt fell Friday morning without injury-today pt called EMS for bilateral flank pain and a piece of glass "stuck in R foot". Pt is ambulatory per her norm. Pt is A&O and in NAD

## 2015-11-20 NOTE — ED Notes (Addendum)
Attempted to start and IV. Pt started to yell stating, "you are digging me." Pt became angry and asked this RN to leave the room. Pt sts that she doesn't want to be preached to. Pt was advised that phlebotomy will be in to draw blood. Patty, RN at bedside talking to pt

## 2015-11-20 NOTE — ED Notes (Signed)
RN will collect labs at time of IV 

## 2016-01-14 ENCOUNTER — Emergency Department (HOSPITAL_COMMUNITY): Payer: Medicare HMO

## 2016-01-14 ENCOUNTER — Encounter (HOSPITAL_COMMUNITY): Payer: Self-pay | Admitting: *Deleted

## 2016-01-14 ENCOUNTER — Emergency Department (HOSPITAL_COMMUNITY)
Admission: EM | Admit: 2016-01-14 | Discharge: 2016-01-14 | Disposition: A | Discharge status: Home / Self Care | Payer: Medicare HMO | Attending: Emergency Medicine | Admitting: Emergency Medicine

## 2016-01-14 ENCOUNTER — Other Ambulatory Visit: Payer: Self-pay

## 2016-01-14 DIAGNOSIS — F17210 Nicotine dependence, cigarettes, uncomplicated: Secondary | ICD-10-CM | POA: Insufficient documentation

## 2016-01-14 DIAGNOSIS — M1990 Unspecified osteoarthritis, unspecified site: Secondary | ICD-10-CM | POA: Insufficient documentation

## 2016-01-14 DIAGNOSIS — J40 Bronchitis, not specified as acute or chronic: Secondary | ICD-10-CM | POA: Insufficient documentation

## 2016-01-14 DIAGNOSIS — R0602 Shortness of breath: Secondary | ICD-10-CM | POA: Diagnosis present

## 2016-01-14 MED ORDER — DOXYCYCLINE HYCLATE 100 MG PO CAPS: 100.0000 mg | ORAL_CAPSULE | Freq: Two times a day (BID) | ORAL | Status: DC

## 2016-01-14 MED ORDER — BENZONATATE 100 MG PO CAPS: 100.0000 mg | ORAL_CAPSULE | Freq: Three times a day (TID) | ORAL | Status: DC

## 2016-01-14 NOTE — ED Notes (Signed)
Pt refused to use bus fopr transport home states shell have to walk to for from bus stop to shelter, [employer] and [employer]  states unable to transport d/t not a permeant resident

## 2016-01-14 NOTE — ED Notes (Signed)
MD at bedside. 

## 2016-01-14 NOTE — ED Notes (Signed)
Bed: WA02 Expected date:  Expected time:  Means of arrival:  Comments: EMS 82yo F SHOB / congestion

## 2016-01-14 NOTE — ED Notes (Signed)
Patient transported to X-ray 

## 2016-01-14 NOTE — ED Notes (Addendum)
Social worker requested BY [place] for resources to apply for assist living bus pass given back to shelter

## 2016-01-14 NOTE — ED Notes (Signed)
Social at bedside with resources papers

## 2016-01-14 NOTE — Discharge Instructions (Signed)
Upper Respiratory Infection, Adult Most upper respiratory infections (URIs) are a viral infection of the air passages leading to the lungs. A URI affects the nose, throat, and upper air passages. The most common type of URI is nasopharyngitis and is typically referred to as "the common cold." URIs run their course and usually go away on their own. Most of the time, a URI does not require medical attention, but sometimes a bacterial infection in the upper airways can follow a viral infection. This is called a secondary infection. Sinus and middle ear infections are common types of secondary upper respiratory infections. Bacterial pneumonia can also complicate a URI. A URI can worsen asthma and chronic obstructive pulmonary disease (COPD). Sometimes, these complications can require emergency medical care and may be life threatening.  CAUSES Almost all URIs are caused by viruses. A virus is a type of germ and can spread from one person to another.  RISKS FACTORS You may be at risk for a URI if:   You smoke.   You have chronic heart or lung disease.  You have a weakened defense (immune) system.   You are very young or very old.   You have nasal allergies or asthma.  You work in crowded or poorly ventilated areas.  You work in health care facilities or schools. SIGNS AND SYMPTOMS  Symptoms typically develop 2-3 days after you come in contact with a cold virus. Most viral URIs last 7-10 days. However, viral URIs from the influenza virus (flu virus) can last 14-18 days and are typically more severe. Symptoms may include:   Runny or stuffy (congested) nose.   Sneezing.   Cough.   Sore throat.   Headache.   Fatigue.   Fever.   Loss of appetite.   Pain in your forehead, behind your eyes, and over your cheekbones (sinus pain).  Muscle aches.  DIAGNOSIS  Your health care provider may diagnose a URI by:  Physical exam.  Tests to check that your symptoms are not due to  another condition such as:  Strep throat.  Sinusitis.  Pneumonia.  Asthma. TREATMENT  A URI goes away on its own with time. It cannot be cured with medicines, but medicines may be prescribed or recommended to relieve symptoms. Medicines may help:  Reduce your fever.  Reduce your cough.  Relieve nasal congestion. HOME CARE INSTRUCTIONS   Take medicines only as directed by your health care provider.   Gargle warm saltwater or take cough drops to comfort your throat as directed by your health care provider.  Use a warm mist humidifier or inhale steam from a shower to increase air moisture. This may make it easier to breathe.  Drink enough fluid to keep your urine clear or pale yellow.   Eat soups and other clear broths and maintain good nutrition.   Rest as needed.   Return to work when your temperature has returned to normal or as your health care provider advises. You may need to stay home longer to avoid infecting others. You can also use a face mask and careful hand washing to prevent spread of the virus.  Increase the usage of your inhaler if you have asthma.   Do not use any tobacco products, including cigarettes, chewing tobacco, or electronic cigarettes. If you need help quitting, ask your health care provider. PREVENTION  The best way to protect yourself from getting a cold is to practice good hygiene.   Avoid oral or hand contact with people with cold   symptoms.   Wash your hands often if contact occurs.  There is no clear evidence that vitamin C, vitamin E, echinacea, or exercise reduces the chance of developing a cold. However, it is always recommended to get plenty of rest, exercise, and practice good nutrition.  SEEK MEDICAL CARE IF:   You are getting worse rather than better.   Your symptoms are not controlled by medicine.   You have chills.  You have worsening shortness of breath.  You have brown or red mucus.  You have yellow or brown nasal  discharge.  You have pain in your face, especially when you bend forward.  You have a fever.  You have swollen neck glands.  You have pain while swallowing.  You have white areas in the back of your throat. SEEK IMMEDIATE MEDICAL CARE IF:   You have severe or persistent:  Headache.  Ear pain.  Sinus pain.  Chest pain.  You have chronic lung disease and any of the following:  Wheezing.  Prolonged cough.  Coughing up blood.  A change in your usual mucus.  You have a stiff neck.  You have changes in your:  Vision.  Hearing.  Thinking.  Mood. MAKE SURE YOU:   Understand these instructions.  Will watch your condition.  Will get help right away if you are not doing well or get worse.   This information is not intended to replace advice given to you by your health care provider. Make sure you discuss any questions you have with your health care provider.   Document Released: 01/02/2001 Document Revised: 11/23/2014 Document Reviewed: 10/14/2013 Elsevier Interactive Patient Education 2016 [employer]  

## 2016-01-14 NOTE — Progress Notes (Signed)
CSW met with this 82 y/o, Caucasian female that requested information on ALF.  Patient states she has been homeless for three years and is currently residing at the [facility]  She has been there for 30 days and has been extended.  She was given a FL-2 from the Chief of Staff, but has no PCP.  CSW gave patient information on free transportation for Medicaid clients and residents over 60.  CSW gave patient information on [facility] and [facility] PCP.  CSW gave patient information on ALF.  CSW let the patient know that she will need to make a PCP appointment and have that office complete a FL-2 and then assist in getting her into a ALF.  [place] Disposition CSW [phone]

## 2016-01-14 NOTE — ED Notes (Signed)
Pt arrived via EMS, from shelter with complaints of chest congestion, states she is susceptible to bronchitis. She has had SOB x 4 days and breathing was worse on last night.  VS: BP:148/92 P: 82 SpO2:98RA

## 2016-01-14 NOTE — ED Notes (Signed)
Patient is resting comfortably. 

## 2016-01-14 NOTE — ED Notes (Signed)
Social worker gave cab voucher pt escort to lobby to wait for cab

## 2016-01-14 NOTE — ED Notes (Signed)
amb to BR w/o difficulty 

## 2016-01-14 NOTE — ED Provider Notes (Signed)
CSN: [id number]     Arrival date & time 01/14/16  0645 History   First MD Initiated Contact with Patient 01/14/16 0809     Chief Complaint  Patient presents with  . Shortness of Breath     (Consider location/radiation/quality/duration/timing/severity/associated sxs/prior Treatment) HPI Comments: Patient presents with cough and cold symptoms. She states over the last 3-4 days she's had runny nose and sinus congestion. She's felt more fatigued. Now she feels like the congestion has moved down into her chest. She is coughing up some yellow sputum. She denies any shortness of breath. No chest pain. She currently lives in a homeless shelter. She is an everyday smoker. She does not use inhalers. She denies any leg pain or swelling. No fevers. No nausea or vomiting. No chest pain.  Patient is a 82 y.o. female presenting with shortness of breath.  Shortness of Breath Associated symptoms: cough   Associated symptoms: no abdominal pain, no chest pain, no diaphoresis, no fever, no headaches, no rash and no vomiting     Past Medical History  Diagnosis Date  . Arthritis    Past Surgical History  Procedure Laterality Date  . Total knee arthroplasty    . Abdominal hysterectomy     No family history on file. Social History  Substance Use Topics  . Smoking status: Current Every Day Smoker -- 1.00 packs/day for 10 years    Types: Cigarettes  . Smokeless tobacco: None  . Alcohol Use: No   OB History    No data available     Review of Systems  Constitutional: Positive for fatigue. Negative for fever, chills and diaphoresis.  HENT: Negative for congestion, rhinorrhea and sneezing.   Eyes: Negative.   Respiratory: Positive for cough and shortness of breath. Negative for chest tightness.   Cardiovascular: Negative for chest pain and leg swelling.  Gastrointestinal: Negative for nausea, vomiting, abdominal pain, diarrhea and blood in stool.  Genitourinary: Negative for frequency, hematuria,  flank pain and difficulty urinating.  Musculoskeletal: Negative for back pain and arthralgias.  Skin: Negative for rash.  Neurological: Negative for dizziness, speech difficulty, weakness, numbness and headaches.      Allergies  Ciprofloxacin hcl; Prednisone; and Zantac  Home Medications   Prior to Admission medications   Medication Sig Start Date End Date Taking? Authorizing Provider  benzonatate (TESSALON) 100 MG capsule Take 1 capsule (100 mg total) by mouth every 8 (eight) hours. 01/14/16   Rolan BuccoMelanie Io Dieujuste, MD  doxycycline (VIBRAMYCIN) 100 MG capsule Take 1 capsule (100 mg total) by mouth 2 (two) times daily. One po bid x 7 days 01/14/16   Rolan BuccoMelanie Taniya Dasher, MD   BP 175/70 mmHg  Pulse 90  Temp(Src) 98.6 F (37 C) (Oral)  Resp 17  SpO2 98% Physical Exam  Constitutional: She is oriented to person, place, and time. She appears well-developed and well-nourished.  HENT:  Head: Normocephalic and atraumatic.  Eyes: Pupils are equal, round, and reactive to light.  Neck: Normal range of motion. Neck supple.  Cardiovascular: Normal rate, regular rhythm and normal heart sounds.   Pulmonary/Chest: Effort normal and breath sounds normal. No respiratory distress. She has no wheezes. She has no rales. She exhibits no tenderness.  Abdominal: Soft. Bowel sounds are normal. There is no tenderness. There is no rebound and no guarding.  Musculoskeletal: Normal range of motion. She exhibits no edema.  Lymphadenopathy:    She has no cervical adenopathy.  Neurological: She is alert and oriented to person, place, and time.  Skin: Skin is warm and dry. No rash noted.  Psychiatric: She has a normal mood and affect.    ED Course  Procedures (including critical care time) Labs Review Labs Reviewed - No data to display  Imaging Review Dg Chest 2 View  01/14/2016  CLINICAL DATA:  Chest congestion.  Shortness of breath for 4 days. EXAM: CHEST  2 VIEW COMPARISON:  11/20/2015 and 09/14/2015 FINDINGS: Mild  elevation of the right hemidiaphragm could represent a small amount of volume loss. Otherwise, the lungs are clear. Heart and mediastinum are within normal limits. The trachea is midline. Multilevel degenerative changes in thoracic spine. No large pleural effusions. IMPRESSION: No active cardiopulmonary disease. Electronically Signed   By: Richarda OverlieAdam  Henn M.D.   On: 01/14/2016 09:27   I have personally reviewed and evaluated these images and lab results as part of my medical decision-making.   EKG Interpretation   Date/Time:  Saturday January 14 2016 09:25:31 EDT Ventricular Rate:  64 PR Interval:    QRS Duration: 90 QT Interval:  430 QTC Calculation: 444 R Axis:   55 Text Interpretation:  Sinus rhythm since last tracing no significant  change Confirmed by Hovanes Hymas  MD, Nuchem Grattan ([id number]) on 01/14/2016 10:10:21 AM      MDM   Final diagnoses:  Bronchitis    Patient presents with bronchitis-like symptoms. There is no evidence of pneumonia. Her oxygen saturations are normal. She is nontoxic appearing. She was discharged home in good condition. I did start her on doxycycline now she is an everyday smoker. She also was given a prescription for [employer]les. She was encouraged to establish care with a PCP or return here as needed for any worsening symptoms. She was advised to keep an eye on her blood pressure.    Rolan BuccoMelanie Micha Erck, MD 01/14/16 1010

## 2016-02-17 ENCOUNTER — Emergency Department (HOSPITAL_COMMUNITY)
Admission: EM | Admit: 2016-02-17 | Discharge: 2016-02-17 | Disposition: A | Discharge status: Home / Self Care | Payer: Medicare HMO | Attending: Emergency Medicine | Admitting: Emergency Medicine

## 2016-02-17 ENCOUNTER — Encounter (HOSPITAL_COMMUNITY): Payer: Self-pay | Admitting: Emergency Medicine

## 2016-02-17 DIAGNOSIS — F17210 Nicotine dependence, cigarettes, uncomplicated: Secondary | ICD-10-CM | POA: Diagnosis not present

## 2016-02-17 DIAGNOSIS — N760 Acute vaginitis: Secondary | ICD-10-CM | POA: Diagnosis not present

## 2016-02-17 DIAGNOSIS — R300 Dysuria: Secondary | ICD-10-CM | POA: Diagnosis present

## 2016-02-17 DIAGNOSIS — N730 Acute parametritis and pelvic cellulitis: Secondary | ICD-10-CM

## 2016-02-17 DIAGNOSIS — B9689 Other specified bacterial agents as the cause of diseases classified elsewhere: Secondary | ICD-10-CM

## 2016-02-17 DIAGNOSIS — N739 Female pelvic inflammatory disease, unspecified: Secondary | ICD-10-CM | POA: Insufficient documentation

## 2016-02-17 LAB — URINE MICROSCOPIC-ADD ON

## 2016-02-17 LAB — RAPID HIV SCREEN (HIV 1/2 AB+AG)
HIV 1/2 ANTIBODIES: NONREACTIVE
HIV-1 P24 ANTIGEN - HIV24: NONREACTIVE

## 2016-02-17 LAB — URINALYSIS, ROUTINE W REFLEX MICROSCOPIC
Bilirubin Urine: NEGATIVE
GLUCOSE, UA: NEGATIVE mg/dL
KETONES UR: NEGATIVE mg/dL
NITRITE: NEGATIVE
PROTEIN: NEGATIVE mg/dL
Specific Gravity, Urine: 1.019 (ref 1.005–1.030)
pH: 5.5 (ref 5.0–8.0)

## 2016-02-17 LAB — WET PREP, GENITAL
Sperm: NONE SEEN
Trich, Wet Prep: NONE SEEN
Yeast Wet Prep HPF POC: NONE SEEN

## 2016-02-17 MED ORDER — METRONIDAZOLE 500 MG PO TABS: 500.0000 mg | ORAL_TABLET | Freq: Two times a day (BID) | ORAL | 0 refills | Status: DC

## 2016-02-17 MED ORDER — DOXYCYCLINE HYCLATE 100 MG PO CAPS: 100.0000 mg | ORAL_CAPSULE | Freq: Two times a day (BID) | ORAL | 0 refills | Status: DC

## 2016-02-17 MED ORDER — CEFTRIAXONE SODIUM 250 MG IJ SOLR
250.0000 mg | Freq: Once | INTRAMUSCULAR | Status: AC
  Administered 2016-02-17: 250 mg via INTRAMUSCULAR
  Filled 2016-02-17: qty 250

## 2016-02-17 MED ORDER — AZITHROMYCIN 250 MG PO TABS
1000.0000 mg | ORAL_TABLET | Freq: Once | ORAL | Status: AC
  Administered 2016-02-17: 1000 mg via ORAL
  Filled 2016-02-17: qty 4

## 2016-02-17 NOTE — Discharge Instructions (Signed)
You have been diagnosed with pelvic inflammatory disease and bacterial vaginosis.  Please take antibiotics as prescribed for the full duration.  Avoid sexual activities until your symptoms cleared.  Use protection every time.  Return if you have any concerns.

## 2016-02-17 NOTE — ED Notes (Signed)
Patient understood discharge instructions.  She will return back to shelter.

## 2016-02-17 NOTE — ED Triage Notes (Signed)
Pt is from a homeless shelter.  She is complaining of painful burning sensation when urinating.  Denies fever/chilis.  She does have a headache.  BP: 158/86 HR:72 R:16 97% of room air  15 GCS per EMS

## 2016-02-17 NOTE — ED Provider Notes (Signed)
[facility]-EMERGENCY DEPT Provider Note   CSN: [id number] Arrival date & time: 02/17/16  2130  First Provider Contact:  None       History   Chief Complaint Chief Complaint  Patient presents with  . Recurrent UTI    HPI Cassidy [name] is a 82 y.o. female.  HPI   82 year old female, homeless, presenting today for evaluation of dysuria. Patient reports for the past 3 days she has noticed burning on urination and soreness around her vaginal region. She denies having urinary frequency but does complaining of low abdominal discomfort. She attributed to poor hygiene but accidentally wiped back to front on one occasion. She also had a new sexual partner one week ago, not using protection. She did notice some mild vaginal discharge.  She denies any prior history of STD. She denies having fever, back pain, hematuria, or pain with sexual activity. She lives at a homeless shelter.  Past Medical History:  Diagnosis Date  . Arthritis     There are no active problems to display for this patient.   Past Surgical History:  Procedure Laterality Date  . ABDOMINAL HYSTERECTOMY    . TOTAL KNEE ARTHROPLASTY      OB History    No data available       Home Medications    Prior to Admission medications   Medication Sig Start Date End Date Taking? Authorizing Provider  benzonatate (TESSALON) 100 MG capsule Take 1 capsule (100 mg total) by mouth every 8 (eight) hours. 01/14/16   Rolan Bucco, MD  doxycycline (VIBRAMYCIN) 100 MG capsule Take 1 capsule (100 mg total) by mouth 2 (two) times daily. One po bid x 7 days 01/14/16   Rolan Bucco, MD    Family History No family history on file.  Social History Social History  Substance Use Topics  . Smoking status: Current Every Day Smoker    Packs/day: 1.00    Years: 10.00    Types: Cigarettes  . Smokeless tobacco: Not on file  . Alcohol use No     Allergies   Ciprofloxacin hcl; Prednisone; and Zantac [ranitidine]   Review of  Systems Review of Systems  Constitutional: Negative for fever.  Genitourinary: Positive for dysuria.  All other systems reviewed and are negative.    Physical Exam Updated Vital Signs BP 138/57 (BP Location: Right Arm)   Pulse 66   Temp 98 F (36.7 C) (Oral)   Resp 18   SpO2 99%   Physical Exam  Constitutional: She appears well-developed and well-nourished. No distress.  HENT:  Head: Atraumatic.  Eyes: Conjunctivae are normal.  Neck: Neck supple.  Abdominal: Soft. Bowel sounds are normal. There is no tenderness.  Genitourinary:  Genitourinary Comments: Chaperone present during exam. No inguinal lymphadenopathy or inguinal hernia noted. Evidence of atrophic vaginitis on external examination. Pain with speculum insertion. Friable tissues with discharge noted in vaginal vault. Unable to visualize cervical os due to hysterectomy. No adnexal mass on bimanual examination.  Neurological: She is alert.  Skin: No rash noted.  Psychiatric: She has a normal mood and affect.  Nursing note and vitals reviewed.    ED Treatments / Results  Labs (all labs ordered are listed, but only abnormal results are displayed) Labs Reviewed  WET PREP, GENITAL - Abnormal; Notable for the following:       Result Value   Clue Cells Wet Prep HPF POC PRESENT (*)    WBC, Wet Prep HPF POC MANY (*)    All other  components within normal limits  URINALYSIS, ROUTINE W REFLEX MICROSCOPIC (NOT AT [facility]) - Abnormal; Notable for the following:    Hgb urine dipstick MODERATE (*)    Leukocytes, UA MODERATE (*)    All other components within normal limits  URINE MICROSCOPIC-ADD ON - Abnormal; Notable for the following:    Squamous Epithelial / LPF 0-5 (*)    Bacteria, UA FEW (*)    All other components within normal limits  RAPID HIV SCREEN (HIV 1/2 AB+AG)  RPR  GC/CHLAMYDIA PROBE AMP ([place]) NOT AT [facility]    EKG  EKG Interpretation None       Radiology No results found.  Procedures Procedures  (including critical care time)  Medications Ordered in ED Medications  azithromycin (ZITHROMAX) tablet 1,000 mg (not administered)  cefTRIAXone (ROCEPHIN) injection 250 mg (not administered)     Initial Impression / Assessment and Plan / ED Course  I have reviewed the triage vital signs and the nursing notes.  Pertinent labs & imaging results that were available during my care of the patient were reviewed by me and considered in my medical decision making (see chart for details).  Clinical Course    BP 138/57 (BP Location: Right Arm)   Pulse 66   Temp 98 F (36.7 C) (Oral)   Resp 18   Ht  (1.626 m)   Wt 56.2 kg   SpO2 95%   BMI 21.28 kg/m    Final Clinical Impressions(s) / ED Diagnoses   Final diagnoses:  PID (acute pelvic inflammatory disease)  BV (bacterial vaginosis)    New Prescriptions New Prescriptions   No medications on file   9:11 AM Patient presents with dysuria and vaginal soreness. Recent new sexual partner. Urinalysis shows signs of infection However, she denies having urinary frequency and urgency, which is less consistent with a UTI.  Evidence of atrophic vaginitis on visual examination. On Pelvic exam, Patient does have some vaginal discharge and pain with pelvic examination to suggest PID. Plan to treat for potential STD with Rocephin and Zithromax. Patient discharged with doxycycline and Flagyl.  Care discussed with Dr. Criss Alvine.     Fayrene Helper, PA-C 02/17/16 1610    Pricilla Loveless, MD 02/17/16 [phone]

## 2016-02-17 NOTE — ED Notes (Signed)
Bed: WA03 Expected date:  Expected time:  Means of arrival:  Comments: EMS-[facility]I 

## 2016-02-18 LAB — RPR: RPR: NONREACTIVE

## 2016-02-20 LAB — GC/CHLAMYDIA PROBE AMP (~~LOC~~) NOT AT ARMC
CHLAMYDIA, DNA PROBE: NEGATIVE
NEISSERIA GONORRHEA: NEGATIVE

## 2016-04-10 ENCOUNTER — Encounter: Payer: Self-pay | Admitting: Specialist

## 2016-05-10 ENCOUNTER — Emergency Department (HOSPITAL_COMMUNITY): Payer: Medicare HMO

## 2016-05-10 ENCOUNTER — Observation Stay (HOSPITAL_COMMUNITY)
Admission: EM | Admit: 2016-05-10 | Discharge: 2016-05-12 | Disposition: A | Discharge status: Home / Self Care | Payer: Medicare HMO | Attending: Internal Medicine | Admitting: Internal Medicine

## 2016-05-10 ENCOUNTER — Observation Stay (HOSPITAL_COMMUNITY): Payer: Medicare HMO

## 2016-05-10 ENCOUNTER — Encounter (HOSPITAL_COMMUNITY): Payer: Self-pay | Admitting: Emergency Medicine

## 2016-05-10 DIAGNOSIS — R001 Bradycardia, unspecified: Secondary | ICD-10-CM

## 2016-05-10 DIAGNOSIS — Z87891 Personal history of nicotine dependence: Secondary | ICD-10-CM | POA: Diagnosis not present

## 2016-05-10 DIAGNOSIS — F4329 Adjustment disorder with other symptoms: Secondary | ICD-10-CM | POA: Clinically undetermined

## 2016-05-10 DIAGNOSIS — R079 Chest pain, unspecified: Principal | ICD-10-CM | POA: Diagnosis present

## 2016-05-10 DIAGNOSIS — F17210 Nicotine dependence, cigarettes, uncomplicated: Secondary | ICD-10-CM | POA: Insufficient documentation

## 2016-05-10 DIAGNOSIS — R2689 Other abnormalities of gait and mobility: Secondary | ICD-10-CM

## 2016-05-10 DIAGNOSIS — R42 Dizziness and giddiness: Secondary | ICD-10-CM

## 2016-05-10 DIAGNOSIS — F05 Delirium due to known physiological condition: Secondary | ICD-10-CM | POA: Diagnosis not present

## 2016-05-10 DIAGNOSIS — I10 Essential (primary) hypertension: Secondary | ICD-10-CM | POA: Insufficient documentation

## 2016-05-10 DIAGNOSIS — R410 Disorientation, unspecified: Secondary | ICD-10-CM

## 2016-05-10 DIAGNOSIS — Z79899 Other long term (current) drug therapy: Secondary | ICD-10-CM | POA: Insufficient documentation

## 2016-05-10 DIAGNOSIS — Z96659 Presence of unspecified artificial knee joint: Secondary | ICD-10-CM | POA: Diagnosis not present

## 2016-05-10 HISTORY — DX: Essential (primary) hypertension: I10

## 2016-05-10 LAB — TSH: TSH: 1.65 u[IU]/mL (ref 0.350–4.500)

## 2016-05-10 LAB — URINALYSIS, ROUTINE W REFLEX MICROSCOPIC
BILIRUBIN URINE: NEGATIVE
Glucose, UA: NEGATIVE mg/dL
KETONES UR: NEGATIVE mg/dL
Leukocytes, UA: NEGATIVE
NITRITE: NEGATIVE
PROTEIN: NEGATIVE mg/dL
SPECIFIC GRAVITY, URINE: 1.011 (ref 1.005–1.030)
pH: 5.5 (ref 5.0–8.0)

## 2016-05-10 LAB — CBC WITH DIFFERENTIAL/PLATELET
Basophils Absolute: 0 K/uL (ref 0.0–0.1)
Basophils Relative: 0 %
Eosinophils Absolute: 0.3 K/uL (ref 0.0–0.7)
Eosinophils Relative: 3 %
HCT: 38.8 % (ref 36.0–46.0)
Hemoglobin: 13.1 g/dL (ref 12.0–15.0)
Lymphocytes Relative: 24 %
Lymphs Abs: 2.8 K/uL (ref 0.7–4.0)
MCH: 31.2 pg (ref 26.0–34.0)
MCHC: 33.8 g/dL (ref 30.0–36.0)
MCV: 92.4 fL (ref 78.0–100.0)
Monocytes Absolute: 0.8 K/uL (ref 0.1–1.0)
Monocytes Relative: 7 %
Neutro Abs: 7.6 K/uL (ref 1.7–7.7)
Neutrophils Relative %: 66 %
Platelets: 315 K/uL (ref 150–400)
RBC: 4.2 MIL/uL (ref 3.87–5.11)
RDW: 12.4 % (ref 11.5–15.5)
WBC: 11.5 K/uL — ABNORMAL HIGH (ref 4.0–10.5)

## 2016-05-10 LAB — COMPREHENSIVE METABOLIC PANEL
ALT: 12 U/L — AB (ref 14–54)
AST: 18 U/L (ref 15–41)
Albumin: 3.5 g/dL (ref 3.5–5.0)
Alkaline Phosphatase: 55 U/L (ref 38–126)
Anion gap: 8 (ref 5–15)
BUN: 16 mg/dL (ref 6–20)
CHLORIDE: 106 mmol/L (ref 101–111)
CO2: 26 mmol/L (ref 22–32)
CREATININE: 0.91 mg/dL (ref 0.44–1.00)
Calcium: 9.1 mg/dL (ref 8.9–10.3)
GFR calc non Af Amer: 58 mL/min — ABNORMAL LOW (ref >60)
Glucose, Bld: 97 mg/dL (ref 65–99)
Potassium: 3.8 mmol/L (ref 3.5–5.1)
SODIUM: 140 mmol/L (ref 135–145)
Total Bilirubin: 0.6 mg/dL (ref 0.3–1.2)
Total Protein: 6.2 g/dL — ABNORMAL LOW (ref 6.5–8.1)

## 2016-05-10 LAB — RAPID URINE DRUG SCREEN, HOSP PERFORMED
Amphetamines: NOT DETECTED
BARBITURATES: NOT DETECTED
Benzodiazepines: NOT DETECTED
Cocaine: NOT DETECTED
Opiates: NOT DETECTED
Tetrahydrocannabinol: NOT DETECTED

## 2016-05-10 LAB — URINE MICROSCOPIC-ADD ON

## 2016-05-10 LAB — TROPONIN I
Troponin I: <0.03 ng/mL (ref <0.03)
Troponin I: <0.03 ng/mL (ref <0.03)

## 2016-05-10 LAB — MAGNESIUM: MAGNESIUM: 1.7 mg/dL (ref 1.7–2.4)

## 2016-05-10 LAB — PHOSPHORUS: PHOSPHORUS: 3.1 mg/dL (ref 2.5–4.6)

## 2016-05-10 LAB — ETHANOL: Alcohol, Ethyl (B): <5 mg/dL

## 2016-05-10 MED ORDER — ASPIRIN 81 MG PO CHEW
324.0000 mg | CHEWABLE_TABLET | Freq: Once | ORAL | Status: AC
  Administered 2016-05-10: 324 mg via ORAL
  Filled 2016-05-10: qty 4

## 2016-05-10 MED ORDER — SODIUM CHLORIDE 0.45 % IV SOLN
INTRAVENOUS | Status: DC
  Administered 2016-05-10: 1000 mL via INTRAVENOUS
  Administered 2016-05-11: 75 mL/h via INTRAVENOUS

## 2016-05-10 MED ORDER — SODIUM CHLORIDE 0.9% FLUSH
3.0000 mL | Freq: Two times a day (BID) | INTRAVENOUS | Status: DC
  Administered 2016-05-10 – 2016-05-11 (×3): 3 mL via INTRAVENOUS

## 2016-05-10 MED ORDER — ACETAMINOPHEN 325 MG PO TABS: 650.0000 mg | ORAL_TABLET | ORAL | Status: DC | PRN

## 2016-05-10 MED ORDER — STROKE: EARLY STAGES OF RECOVERY BOOK
Freq: Once | Status: DC
  Filled 2016-05-10: qty 1

## 2016-05-10 MED ORDER — ENOXAPARIN SODIUM 40 MG/0.4ML ~~LOC~~ SOLN
40.0000 mg | SUBCUTANEOUS | Status: DC
  Administered 2016-05-10 – 2016-05-12 (×3): 40 mg via SUBCUTANEOUS
  Filled 2016-05-10 (×3): qty 0.4

## 2016-05-10 MED ORDER — ASPIRIN 325 MG PO TABS
325.0000 mg | ORAL_TABLET | Freq: Every day | ORAL | Status: DC
  Administered 2016-05-11 – 2016-05-12 (×2): 325 mg via ORAL
  Filled 2016-05-10 (×2): qty 1

## 2016-05-10 MED ORDER — OXYCODONE HCL 5 MG PO TABS: 5.0000 mg | ORAL_TABLET | ORAL | Status: DC | PRN

## 2016-05-10 MED ORDER — GI COCKTAIL ~~LOC~~: 30.0000 mL | Freq: Four times a day (QID) | ORAL | Status: DC | PRN

## 2016-05-10 MED ORDER — ONDANSETRON HCL 4 MG PO TABS
4.0000 mg | ORAL_TABLET | Freq: Four times a day (QID) | ORAL | Status: DC | PRN
  Filled 2016-05-10: qty 1

## 2016-05-10 MED ORDER — ASPIRIN 300 MG RE SUPP: 300.0000 mg | Freq: Every day | RECTAL | Status: DC

## 2016-05-10 MED ORDER — MECLIZINE HCL 25 MG PO TABS: 25.0000 mg | ORAL_TABLET | Freq: Three times a day (TID) | ORAL | Status: DC | PRN

## 2016-05-10 MED ORDER — ACETAMINOPHEN 650 MG RE SUPP
650.0000 mg | Freq: Four times a day (QID) | RECTAL | Status: DC | PRN
Start: 2016-05-10 — End: 2016-05-12

## 2016-05-10 MED ORDER — ONDANSETRON HCL 4 MG/2ML IJ SOLN: 4.0000 mg | Freq: Four times a day (QID) | INTRAMUSCULAR | Status: DC | PRN

## 2016-05-10 MED ORDER — TRAZODONE HCL 50 MG PO TABS
25.0000 mg | ORAL_TABLET | Freq: Every evening | ORAL | Status: DC | PRN
  Administered 2016-05-11: 25 mg via ORAL
  Filled 2016-05-10 (×2): qty 1

## 2016-05-10 MED ORDER — ONDANSETRON HCL 4 MG/2ML IJ SOLN
4.0000 mg | Freq: Once | INTRAMUSCULAR | Status: AC
  Administered 2016-05-10: 4 mg via INTRAVENOUS
  Filled 2016-05-10: qty 2

## 2016-05-10 MED ORDER — ENOXAPARIN SODIUM 40 MG/0.4ML ~~LOC~~ SOLN: 40.0000 mg | SUBCUTANEOUS | Status: DC

## 2016-05-10 MED ORDER — KETOROLAC TROMETHAMINE 15 MG/ML IJ SOLN: 15.0000 mg | Freq: Four times a day (QID) | INTRAMUSCULAR | Status: DC | PRN

## 2016-05-10 MED ORDER — ACETAMINOPHEN 325 MG PO TABS: 650.0000 mg | ORAL_TABLET | Freq: Four times a day (QID) | ORAL | Status: DC | PRN

## 2016-05-10 NOTE — H&P (Signed)
History and Physical    Cassidy BeardDolores R Aguon ZOX:[id number] DOB: 02/29/1934 DOA: 05/10/2016  PCP: No primary care provider on file. Patient coming from: "Shelter" last [facility] was for [facility] - unconfirmed   Chief Complaint: dizziness, weakness.   HPI: Cassidy [name] is a 82 y.o. female with medical history significant of arthritis and HTN  presenting with multiple vague complaints. Level V caveat applies as patient is very tangential in her thought process and unable to give specific details about her symptoms and even changes her story when asked the same question at different times. Of note patient states that she is currently living in a shelter and has been there for years, but also states that she is homeless. She has no primary care physician. Hospital notes state the patient arrived via EMS because no further details about where EMS picked the patient up from. Patient states that she has had approximately 2-3 days of dizziness. Difficult to tell if this is constant with waxing and waning nature or intermittent. Worse with ambulation and sitting. Improves with rest. Patient states that she is still currently symptomatically even while resting in bed and not moving. Associated with nausea and generalized weakness. Denies any seizure-like activity including limb shaking, loss of bowel or bladder function, LOC, grogginess, tongue biting, head trauma. Patient also complaining of acute chest pain which started while waiting in the emergency room. This was relieved with aspirin. Denies any associated palpitations, shortness of breath, radiation to neck, jaw or left arm. Denies any lower extremity swelling, fevers, cough, dysuria, frequency, fevers.   ED Course: ASA and Zofran given w/ some improvement in sx. Objective findings outlined below  Review of Systems: As per HPI otherwise 10 point review of systems negative.   Ambulatory Status: walks w/ a walker.   Past Medical  History:  Diagnosis Date  . Arthritis   . HTN (hypertension)     Past Surgical History:  Procedure Laterality Date  . ABDOMINAL HYSTERECTOMY    . TOTAL KNEE ARTHROPLASTY      Social History   Social History  . Marital status: Married    Spouse name: N/A  . Number of children: N/A  . Years of education: N/A   Occupational History  . Not on file.   Social History Main Topics  . Smoking status: Current Every Day Smoker    Packs/day: 1.00    Years: 10.00    Types: Cigarettes  . Smokeless tobacco: Not on file  . Alcohol use No  . Drug use: Unknown  . Sexual activity: Not on file   Other Topics Concern  . Not on file   Social History Narrative  . No narrative on file    Allergies  Allergen Reactions  . Ciprofloxacin Hcl Other (See Comments)    Reaction: unknown   . Prednisone Other (See Comments)    Reaction: unknown   . Zantac [Ranitidine] Other (See Comments)    Reaction: unknown  "too strong"     Family History  Problem Relation Age of Onset  . Adopted: Yes    Prior to Admission medications   Medication Sig Start Date End Date Taking? Authorizing Provider  benzonatate (TESSALON) 100 MG capsule Take 1 capsule (100 mg total) by mouth every 8 (eight) hours. Patient not taking: Reported on 05/10/2016 01/14/16   Rolan BuccoMelanie Belfi, MD  doxycycline (VIBRAMYCIN) 100 MG capsule Take 1 capsule (100 mg total) by mouth 2 (two) times daily. Patient not taking: Reported  on 05/10/2016 02/17/16   Fayrene Helper, PA-C  metroNIDAZOLE (FLAGYL) 500 MG tablet Take 1 tablet (500 mg total) by mouth 2 (two) times daily. Patient not taking: Reported on 05/10/2016 02/17/16   Fayrene Helper, PA-C    Physical Exam: Vitals:   05/10/16 0915 05/10/16 0930 05/10/16 1022 05/10/16 1027  BP: (!) 119/49 (!) 114/52  (!) 146/50  Pulse: (!) 49 (!) 50    Resp: 13 14    Temp:    98.2 F (36.8 C)  TempSrc:    Oral  SpO2: 96% 96%  99%  Weight:   58.4 kg (128 lb 11.2 oz)   Height:   5\' 3"  (1.6 m)       General:  Appears calm and comfortable Eyes:  PERRL, EOMI, normal lids, iris ENT: poor dentition, mmm Neck:  no LAD, masses or thyromegaly Cardiovascular:  RRR, no m/r/g. No LE edema.  Respiratory:  CTA bilaterally, no w/r/r. Normal respiratory effort. Abdomen:  soft, ntnd, NABS Skin:  no rash or induration seen on limited exam Musculoskeletal:  grossly normal tone BUE/BLE, good ROM, no bony abnormality Psychiatric:  grossly normal mood and affect, speech fluent and appropriate, AOx3 Neurologic:  CN 2-12 grossly intact, moves all extremities in coordinated fashion, sensation intact, sits unassisted, no dysmetria  Labs on Admission: I have personally reviewed following labs and imaging studies  CBC:  Recent Labs Lab 05/10/16 0322  WBC 11.5*  NEUTROABS 7.6  HGB 13.1  HCT 38.8  MCV 92.4  PLT 315   Basic Metabolic Panel:  Recent Labs Lab 05/10/16 0322  NA 140  K 3.8  CL 106  CO2 26  GLUCOSE 97  BUN 16  CREATININE 0.91  CALCIUM 9.1   GFR: Estimated Creatinine Clearance: 40.8 mL/min (by C-G formula based on SCr of 0.91 mg/dL). Liver Function Tests:  Recent Labs Lab 05/10/16 0322  AST 18  ALT 12*  ALKPHOS 55  BILITOT 0.6  PROT 6.2*  ALBUMIN 3.5   No results for input(s): LIPASE, AMYLASE in the last 168 hours. No results for input(s): AMMONIA in the last 168 hours. Coagulation Profile: No results for input(s): INR, PROTIME in the last 168 hours. Cardiac Enzymes:  Recent Labs Lab 05/10/16 0322  TROPONINI <0.03   BNP (last 3 results) No results for input(s): PROBNP in the last 8760 hours. HbA1C: No results for input(s): HGBA1C in the last 72 hours. CBG: No results for input(s): GLUCAP in the last 168 hours. Lipid Profile: No results for input(s): CHOL, HDL, LDLCALC, TRIG, CHOLHDL, LDLDIRECT in the last 72 hours. Thyroid Function Tests: No results for input(s): TSH, T4TOTAL, FREET4, T3FREE, THYROIDAB in the last 72 hours. Anemia Panel: No  results for input(s): VITAMINB12, FOLATE, FERRITIN, TIBC, IRON, RETICCTPCT in the last 72 hours. Urine analysis:    Component Value Date/Time   COLORURINE YELLOW 05/10/2016 0415   APPEARANCEUR CLEAR 05/10/2016 0415   LABSPEC 1.011 05/10/2016 0415   PHURINE 5.5 05/10/2016 0415   GLUCOSEU NEGATIVE 05/10/2016 0415   HGBUR MODERATE (A) 05/10/2016 0415   BILIRUBINUR NEGATIVE 05/10/2016 0415   KETONESUR NEGATIVE 05/10/2016 0415   PROTEINUR NEGATIVE 05/10/2016 0415   NITRITE NEGATIVE 05/10/2016 0415   LEUKOCYTESUR NEGATIVE 05/10/2016 0415    Creatinine Clearance: Estimated Creatinine Clearance: 40.8 mL/min (by C-G formula based on SCr of 0.91 mg/dL).  Sepsis Labs: @LABRCNTIP (procalcitonin:4,lacticidven:4) )No results found for this or any previous visit (from the past 240 hour(s)).   Radiological Exams on Admission: Ct Head Wo Contrast  Result  Date: 05/10/2016 CLINICAL DATA:  82 year old female with dizziness EXAM: CT HEAD WITHOUT CONTRAST TECHNIQUE: Contiguous axial images were obtained from the base of the skull through the vertex without intravenous contrast. COMPARISON:  None. FINDINGS: Brain: The ventricles and sulci are appropriate in size for patient's age. Mild periventricular and deep white matter chronic microvascular ischemic changes noted. There is no acute intracranial hemorrhage. No mass effect or midline shift noted. Vascular: No hyperdense vessel or unexpected calcification. Skull: Normal. Negative for fracture or focal lesion. Sinuses/Orbits: There is partial opacification of multiple right mastoid air cells. The left mastoid air cells and paranasal sinuses are clear. Other: None IMPRESSION: No acute intracranial hemorrhage. Mild age-related atrophy and chronic microvascular ischemic disease. If symptoms persist and there are no contraindications, MRI may provide better evaluation if clinically indicated Electronically Signed   By: Elgie Collard M.D.   On: 05/10/2016 06:12    Dg Chest Port 1 View  Result Date: 05/10/2016 CLINICAL DATA:  Diffuse chest pain EXAM: PORTABLE CHEST 1 VIEW COMPARISON:  01/14/2016 FINDINGS: Normal heart size and mediastinal contours. No acute infiltrate or edema. No effusion or pneumothorax. Bulky spondylosis. Osteopenia. No acute osseous findings. IMPRESSION: No active disease. Electronically Signed   By: Marnee Spring M.D.   On: 05/10/2016 07:45    EKG: Independently reviewed.  Assessment/Plan Active Problems:   Chest pain, rule out acute myocardial infarction   Dizziness   Bradycardia   Subacute confusional state   Dizziness: unclear etiology though stroke remains high on differential. EtOH and UDS negative, CT head without evidence of acute process, UA normal, CBC with differential unremarkable. Neurological exam fairly benign. Patient continues to describe times a history of constant dizziness for the last 2-3 days with waxing and waning nature and continues to endorse mild dizziness while resting in bed. Of note if this is a stroke patient is well beyond the window for treatment. Denies any hearing loss or history of Mnire's disease. Head turning does not worsen symptoms of gout BPV.  - Telemetry, observation - MRI brain, MRA head and neck - Echo - Orthostatics - IVF, Zofran, meclizine - Neuro consult if MRI + or remains symptomatic.   Chest pain: Patient is adamant at this time that her chest is hurting despite resting comfortably in bed with normal vital signs and then a short time later stating that her chest does not hurt. Not reproducible on palpation. Patient unsure of any previous heart disease workup. Currently resolved. Initial troponin negative, EKG without signs of ACS showing sinus bradycardia - Telemetry, EKG, cycle troponin - GI cocktail - If returns consider CT chest  Social/Psych: Patient with no identified psychiatric diagnoses. Patient brought up several occasions where people in the community accused  her of being crazy over the course of her admission exam. Patient also states that she had her house taken away from her and has been living in a homeless shelter for the last 3 years. Patient is unsure of where it is that she stays or of her emergency or other family contacts.  - Social work consult for persistent with placement of patient after discharge - Psych consult: Please assist with evaluating patient and possibly riding in a psychiatric diagnoses or at least ruling out   DVT prophylaxis: Lovenox  Code Status: FULL  Family Communication: none- attempted to reach emergency contacts in chart  Disposition Plan: pending workup and improvement in dizziness and CP  Consults called: none  Admission status:  obs - tele  MERRELL, DAVID J MD [facility]  If 7PM-7AM, please contact night-coverage [email or web address] Password [email or web address]  05/10/2016, 10:50 AM

## 2016-05-10 NOTE — ED Provider Notes (Signed)
[facility]-EMERGENCY DEPT Provider Note   CSN: [id number] Arrival date & time: 05/10/16  0235  By signing my name below, I, Rosario AdieWilliam Andrew Hiatt, attest that this documentation has been prepared under the direction and in the presence of Zadie Rhineonald Jackline Castilla, MD. Electronically Signed: Rosario AdieWilliam Andrew Hiatt, ED Scribe. 05/10/16. 3:35 AM.  History   Chief Complaint Chief Complaint  Patient presents with  . Dizziness  . Nausea  . Weakness   The history is provided by the patient and the EMS personnel. No language interpreter was used.  Dizziness  Severity:  Moderate Onset quality:  Sudden Duration:  3 days Timing:  Constant Progression:  Waxing and waning Chronicity:  New Context: standing up   Context: not with loss of consciousness   Relieved by:  Being still and lying down Worsened by:  Standing up and sitting upright (ambulation) Ineffective treatments:  None tried Associated symptoms: headaches, nausea, shortness of breath and weakness (diffuse)   Associated symptoms: no chest pain, no syncope, no tinnitus, no vision changes and no vomiting   Risk factors: no anemia, no heart disease, no hx of stroke, no hx of vertigo, no Meniere's disease, no multiple medications and no new medications   Weakness  Primary symptoms include dizziness.  Primary symptoms include no visual change. Associated symptoms include shortness of breath and headaches. Pertinent negatives include no chest pain and no vomiting.   HPI Comments: Cassidy [name] is a 82 y.o. female BIB EMS, with no pertinent PMHx, who presents to the Emergency Department complaining of sudden onset, waxing and waning dizziness w/ associated nausea which began  ~3 days ago. Pt additionally reports associated mild headache which began yesterday, and diffuse bodily weakness since the onset of her dizziness. She additionally states that she has had mild, intermittent upper abdominal pain and mild SOB over the past several days as well. Laying  flat and sitting still will alleviate her dizziness. Positional changes and ambulation will exacerbated her dizziness. No treatments were tried prior to coming into the ED. No h/o prior MI or CVA. Pt ambulates with a walker at baseline. Denies fever, emesis, visual disturbance, syncope, dysuria, cough, chest pain, or any other associated symptoms.   Past Medical History:  Diagnosis Date  . Arthritis    There are no active problems to display for this patient.  Past Surgical History:  Procedure Laterality Date  . ABDOMINAL HYSTERECTOMY    . TOTAL KNEE ARTHROPLASTY     OB History    No data available     Home Medications    Prior to Admission medications   Medication Sig Start Date End Date Taking? Authorizing Provider  benzonatate (TESSALON) 100 MG capsule Take 1 capsule (100 mg total) by mouth every 8 (eight) hours. Patient not taking: Reported on 02/17/2016 01/14/16   Rolan BuccoMelanie Belfi, MD  doxycycline (VIBRAMYCIN) 100 MG capsule Take 1 capsule (100 mg total) by mouth 2 (two) times daily. 02/17/16   Fayrene HelperBowie Tran, PA-C  metroNIDAZOLE (FLAGYL) 500 MG tablet Take 1 tablet (500 mg total) by mouth 2 (two) times daily. 02/17/16   Fayrene HelperBowie Tran, PA-C   Family History No family history on file.  Social History Social History  Substance Use Topics  . Smoking status: Current Every Day Smoker    Packs/day: 1.00    Years: 10.00    Types: Cigarettes  . Smokeless tobacco: Not on file  . Alcohol use No   Allergies   Ciprofloxacin hcl; Prednisone; and Zantac [ranitidine]  Review of  Systems Review of Systems  Constitutional: Negative for fever.  HENT: Negative for tinnitus.   Respiratory: Positive for shortness of breath. Negative for cough.   Cardiovascular: Negative for chest pain and syncope.  Gastrointestinal: Positive for abdominal pain and nausea. Negative for vomiting.  Genitourinary: Negative for dysuria.  Neurological: Positive for dizziness, weakness (diffuse) and headaches. Negative  for syncope.  All other systems reviewed and are negative.  Physical Exam Updated Vital Signs BP 126/99 (BP Location: Right Arm)   Pulse (!) 50   Temp 97.8 F (36.6 C) (Oral)   Resp 18   Ht 5\' 2"  (1.575 m)   SpO2 95%   Physical Exam  CONSTITUTIONAL: elderly, no acute distress HEAD: Normocephalic/atraumatic EYES: EOMI/PERRL, no nystagmus, no ptosis ENMT: Mucous membranes moist, poor dentition NECK: supple no meningeal signs, no bruits CV: S1/S2 noted, no murmurs/rubs/gallops noted LUNGS: Lungs are clear to auscultation bilaterally, no apparent distress ABDOMEN: soft, nontender, no rebound or guarding GU:no cva tenderness NEURO:Awake/alert, face symmetric, no arm or leg drift is noted Cranial nerves 3/4/5/6/01/28/09/11/12 tested and intact Sensation to light touch intact in all extremities.  No ataxia EXTREMITIES: pulses normal, full ROM SKIN: warm, color normal PSYCH: no abnormalities of mood noted  ED Treatments / Results  DIAGNOSTIC STUDIES: Oxygen Saturation is 99% on RA, normal by my interpretation.   COORDINATION OF CARE: 3:46 AM-Discussed next steps with pt. Pt verbalized understanding and is agreeable with the plan.   Labs (all labs ordered are listed, but only abnormal results are displayed) Labs Reviewed  CBC WITH DIFFERENTIAL/PLATELET - Abnormal; Notable for the following:       Result Value   WBC 11.5 (*)    All other components within normal limits  COMPREHENSIVE METABOLIC PANEL - Abnormal; Notable for the following:    Total Protein 6.2 (*)    ALT 12 (*)    GFR calc non Af Amer 58 (*)    All other components within normal limits  URINALYSIS, ROUTINE W REFLEX MICROSCOPIC (NOT AT [facility]) - Abnormal; Notable for the following:    Hgb urine dipstick MODERATE (*)    All other components within normal limits  URINE MICROSCOPIC-ADD ON - Abnormal; Notable for the following:    Squamous Epithelial / LPF 6-30 (*)    Bacteria, UA FEW (*)    All other components  within normal limits  TROPONIN I  RAPID URINE DRUG SCREEN, HOSP PERFORMED  ETHANOL   EKG  EKG Interpretation  Date/Time:  Thursday May 10 2016 03:57:15 EDT Ventricular Rate:  43 PR Interval:    QRS Duration: 107 QT Interval:  477 QTC Calculation: 404 R Axis:   27 Text Interpretation:  Sinus bradycardia changed from prior Abnormal ekg Confirmed by Bebe Shaggy  MD, Farrell Broerman ([id number]) on 05/10/2016 4:52:58 AM      Radiology Ct Head Wo Contrast  Result Date: 05/10/2016 CLINICAL DATA:  82 year old female with dizziness EXAM: CT HEAD WITHOUT CONTRAST TECHNIQUE: Contiguous axial images were obtained from the base of the skull through the vertex without intravenous contrast. COMPARISON:  None. FINDINGS: Brain: The ventricles and sulci are appropriate in size for patient's age. Mild periventricular and deep white matter chronic microvascular ischemic changes noted. There is no acute intracranial hemorrhage. No mass effect or midline shift noted. Vascular: No hyperdense vessel or unexpected calcification. Skull: Normal. Negative for fracture or focal lesion. Sinuses/Orbits: There is partial opacification of multiple right mastoid air cells. The left mastoid air cells and paranasal sinuses are clear. Other: None  IMPRESSION: No acute intracranial hemorrhage. Mild age-related atrophy and chronic microvascular ischemic disease. If symptoms persist and there are no contraindications, MRI may provide better evaluation if clinically indicated Electronically Signed   By: Elgie Collard M.D.   On: 05/10/2016 06:12    Procedures Procedures   Medications Ordered in ED Medications  aspirin chewable tablet 324 mg (324 mg Oral Given 05/10/16 0701)  ondansetron (ZOFRAN) injection 4 mg (4 mg Intravenous Given 05/10/16 0659)    Initial Impression / Assessment and Plan / ED Course  I have reviewed the triage vital signs and the nursing notes.  Pertinent labs results that were available during my care of the  patient were reviewed by me and considered in my medical decision making (see chart for details).  Clinical Course    3:46 AM Pt here for dizziness for 3 days.  She reports it is worsening.  Stroke workup initiated tPA in stroke considered but not given due to: Onset over 3-4.5hours  6:54 AM Initial workup negative Pt now is reporting she is having chest pain.  She jumped up in bed and reported we "aren't doing anything" for her pain She initially denied CP on my initial evaluation She now appears agitated I suspect there might be associated dementia in this patient However given age, and now reported CP, will admit  7:44 AM D/w dr Konrad Itzell for admission  Final Clinical Impressions(s) / ED Diagnoses   Final diagnoses:  Dizziness  Chest pain, rule out acute myocardial infarction   New Prescriptions New Prescriptions   No medications on file   I personally performed the services described in this documentation, which was scribed in my presence. The recorded information has been reviewed and is accurate.       Zadie Rhine, MD 05/10/16 [phone]

## 2016-05-10 NOTE — ED Notes (Addendum)
Patient transported to CT 

## 2016-05-10 NOTE — ED Notes (Signed)
Attempted report x1. Floor to callback.

## 2016-05-10 NOTE — ED Notes (Signed)
Attempted report x 2 

## 2016-05-10 NOTE — Evaluation (Signed)
Physical Therapy Evaluation Patient Details Name: Cassidy [name] MRN: [id number] DOB: 1933-10-04 Today's Date: 05/10/2016   History of Present Illness  Patient is a 82 y/o female with hx of arthritis and HTN  presenting with multiple vague complaints- dizziness, chest pain. HEad CT-unremarkable. Psych eval pending.   Clinical Impression  Patient presents with mild balance deficits and generalized weakness impacting mobility. Pt with tangential speech throughout session but able to be redirected to task. Tolerated gait training with Min guard assist for safety. No dizziness or chest pain during mobility with stable HR. Will follow acutely for higher level balance training to maximize independence and mobility prior to return home. Encouraged ambulation and mobility while in hospital.     Follow Up Recommendations No PT follow up;Supervision - Intermittent    Equipment Recommendations  None recommended by PT    Recommendations for Other Services OT consult     Precautions / Restrictions Precautions Precautions: Fall Restrictions Weight Bearing Restrictions: No      Mobility  Bed Mobility               General bed mobility comments: Sitting EOB upon PT arrival.   Transfers Overall transfer level: Needs assistance Equipment used: Rolling walker (2 wheeled) Transfers: Sit to/from Stand Sit to Stand: Supervision         General transfer comment: Supervision for safety. No assist needed. No dizziness.   Ambulation/Gait Ambulation/Gait assistance: Min guard Ambulation Distance (Feet): 150 Feet Assistive device: Rolling walker (2 wheeled) Gait Pattern/deviations: Step-through pattern;Decreased stride length;Trunk flexed;Narrow base of support Gait velocity: decreased   General Gait Details: Slow, mildly unsteady gait esp noted with head turns but no LOB. HR stable - 75 bpm. no chest pain or dizziness during ambulation.  Stairs            Wheelchair Mobility     Modified Rankin (Stroke Patients Only)       Balance Overall balance assessment: Needs assistance Sitting-balance support: Feet supported;No upper extremity supported Sitting balance-Leahy Scale: Good     Standing balance support: During functional activity;Bilateral upper extremity supported Standing balance-Leahy Scale: Poor Standing balance comment: Reliant on BUEs for support in standing.                             Pertinent Vitals/Pain Pain Assessment: No/denies pain    Home Living Family/patient expects to be discharged to:: Shelter/Homeless       Home Access: Level entry     Home Layout: One level Home Equipment: Walker - 4 wheels      Prior Function Level of Independence: Independent with assistive device(s)         Comments: Uses rollator for mobility.      Hand Dominance        Extremity/Trunk Assessment   Upper Extremity Assessment: Defer to OT evaluation           Lower Extremity Assessment: Generalized weakness (neuropathy in bil feet. )         Communication   Communication: No difficulties  Cognition Arousal/Alertness: Awake/alert Behavior During Therapy: WFL for tasks assessed/performed Overall Cognitive Status: No family/caregiver present to determine baseline cognitive functioning (tangential speech; difficult to follow.)                      General Comments      Exercises     Assessment/Plan    PT Assessment Patient needs continued  PT services  PT Problem List Decreased strength;Decreased mobility;Decreased cognition;Decreased activity tolerance;Impaired sensation;Decreased balance          PT Treatment Interventions DME instruction;Therapeutic activities;Gait training;Therapeutic exercise;Patient/family education;Balance training;Functional mobility training    PT Goals (Current goals can be found in the Care Plan section)  Acute Rehab PT Goals Patient Stated Goal: to get back to feeling  like myself PT Goal Formulation: With patient Time For Goal Achievement: 05/24/16 Potential to Achieve Goals: Good    Frequency Min 3X/week   Barriers to discharge Decreased caregiver support      Co-evaluation               End of Session Equipment Utilized During Treatment: Gait belt Activity Tolerance: Patient tolerated treatment well Patient left: in bed;with bed alarm set;with call bell/phone within reach (sitting EOB.) Nurse Communication: Mobility status    Functional Assessment Tool Used: clinical judgment Functional Limitation: Mobility: Walking and moving around Mobility: Walking and Moving Around Current Status ([id number](G8978): At least 1 percent but less than 20 percent impaired, limited or restricted Mobility: Walking and Moving Around Goal Status [phone]([id number]): At least 1 percent but less than 20 percent impaired, limited or restricted    Time: 1446-1510 PT Time Calculation (min) (ACUTE ONLY): 24 min   Charges:   PT Evaluation $PT Eval Moderate Complexity: 1 Procedure PT Treatments $Gait Training: 8-22 mins   PT G Codes:   PT G-Codes **NOT FOR INPATIENT CLASS** Functional Assessment Tool Used: clinical judgment Functional Limitation: Mobility: Walking and moving around Mobility: Walking and Moving Around Current Status (U9811(G8978): At least 1 percent but less than 20 percent impaired, limited or restricted Mobility: Walking and Moving Around Goal Status [phone](G8979): At least 1 percent but less than 20 percent impaired, limited or restricted    Neilson Oehlert A Reizy Dunlow 05/10/2016, 3:23 PM Mylo RedShauna Willi [place], PT, DPT [phone]

## 2016-05-10 NOTE — ED Triage Notes (Signed)
Per EMS pt reports nausea, weakness and dizziness since Tuesday.  No fever, cough or other complaints.

## 2016-05-10 NOTE — Consult Note (Signed)
[facility] Face-to-Face Psychiatry Consult   Reason for Consult:  Questionable psychosocial stresses Referring Physician:  Dr. Marily Memos Patient Identification: Cassidy [name] MRN:  [id number] Principal Diagnosis: Adjustment disorder with other symptoms Diagnosis:   Patient Active Problem List   Diagnosis Date Noted  . Chest pain, rule out acute myocardial infarction [R07.9] 05/10/2016  . Dizziness [R42] 05/10/2016  . Bradycardia [R00.1] 05/10/2016  . Subacute confusional state [F05] 05/10/2016    Total Time spent with patient: 45 minutes  Subjective:   Cassidy PISARSKI is a 82 y.o. female patient admitted with dizziness.  HPI:  Appreciate psychiatric consultation. DANEKA [name] is a 82 y.o. female seen, chart reviewed for this face-to-face psychiatric consultation and evaluation. Patient appeared lying in her bed and within few months of talking to her she got up on her bed and sitting while talking. Patient is calm, cooperative and enthusiastic about talking about herself. She is very pleasant throughout the evaluation and appreciate psychiatry's time to spend with her today. Patient reported she has been living in a shelter/assisted-living facility and not getting along with facility staff. Patient reportedly being in and out of different facilities for the last 3-1/2 years. Reportedly her husband of 51 years walked away on her and she could not pay for her morbid case which resulted she was evacuated. Patient has to given every all her 7 dogs and lack of personal stuff because of behind on her house mortgage for 6 months. Patient also reportedly has 3 children from her first husband but they were not caring her over several years. Patient reportedly born in [place], grew up in [place] with the adopted family and has a high school education. Patient denied current symptoms of depression, anxiety, auditory/visual hallucinations, delusions and paranoia. Patient also denies suicidal/homicidal  ideation, intention or plans. Patient has intact cognitions including orientation, memory immediate and delayed, and long-term memory, concentration and language functions.   Past Psychiatric History: Patient has no previous history of acute psychiatric hospitalizations.  Risk to Self: Is patient at risk for suicide?: No Risk to Others:   Prior Inpatient Therapy:   Prior Outpatient Therapy:    Past Medical History:  Past Medical History:  Diagnosis Date  . Arthritis   . HTN (hypertension)     Past Surgical History:  Procedure Laterality Date  . ABDOMINAL HYSTERECTOMY    . TOTAL KNEE ARTHROPLASTY    . TUBAL LIGATION     Family History:  Family History  Problem Relation Age of Onset  . Adopted: Yes   Family Psychiatric  History: Patient has no family history of mental illness and reportedly she was adopted when she was 62 months old baby Social History:  History  Alcohol Use No     History  Drug Use No    Social History   Social History  . Marital status: Married    Spouse name: N/A  . Number of children: N/A  . Years of education: N/A   Social History Main Topics  . Smoking status: Former Smoker    Packs/day: 1.00    Years: 10.00    Types: Cigarettes    Quit date: 05/01/2016  . Smokeless tobacco: Never Used  . Alcohol use No  . Drug use: No  . Sexual activity: Not Asked   Other Topics Concern  . None   Social History Narrative  . None   Additional Social History:    Allergies:   Allergies  Allergen Reactions  . Ciprofloxacin Hcl  Other (See Comments)    Reaction: unknown   . Prednisone Other (See Comments)    Reaction: unknown   . Zantac [Ranitidine] Other (See Comments)    Reaction: unknown  "too strong"     Labs:  Results for orders placed or performed during the hospital encounter of 05/10/16 (from the past 48 hour(s))  CBC with Differential/Platelet     Status: Abnormal   Collection Time: 05/10/16  3:22 AM  Result Value Ref Range   WBC  11.5 (H) 4.0 - 10.5 K/uL   RBC 4.20 3.87 - 5.11 MIL/uL   Hemoglobin 13.1 12.0 - 15.0 g/dL   HCT 38.8 36.0 - 46.0 %   MCV 92.4 78.0 - 100.0 fL   MCH 31.2 26.0 - 34.0 pg   MCHC 33.8 30.0 - 36.0 g/dL   RDW 12.4 11.5 - 15.5 %   Platelets 315 150 - 400 K/uL   Neutrophils Relative % 66 %   Neutro Abs 7.6 1.7 - 7.7 K/uL   Lymphocytes Relative 24 %   Lymphs Abs 2.8 0.7 - 4.0 K/uL   Monocytes Relative 7 %   Monocytes Absolute 0.8 0.1 - 1.0 K/uL   Eosinophils Relative 3 %   Eosinophils Absolute 0.3 0.0 - 0.7 K/uL   Basophils Relative 0 %   Basophils Absolute 0.0 0.0 - 0.1 K/uL  Comprehensive metabolic panel     Status: Abnormal   Collection Time: 05/10/16  3:22 AM  Result Value Ref Range   Sodium 140 135 - 145 mmol/L   Potassium 3.8 3.5 - 5.1 mmol/L   Chloride 106 101 - 111 mmol/L   CO2 26 22 - 32 mmol/L   Glucose, Bld 97 65 - 99 mg/dL   BUN 16 6 - 20 mg/dL   Creatinine, Ser 0.91 0.44 - 1.00 mg/dL   Calcium 9.1 8.9 - 10.3 mg/dL   Total Protein 6.2 (L) 6.5 - 8.1 g/dL   Albumin 3.5 3.5 - 5.0 g/dL   AST 18 15 - 41 U/L   ALT 12 (L) 14 - 54 U/L   Alkaline Phosphatase 55 38 - 126 U/L   Total Bilirubin 0.6 0.3 - 1.2 mg/dL   GFR calc non Af Amer 58 (L) >60 mL/min   GFR calc Af Amer >60 >60 mL/min    Comment: (NOTE) The eGFR has been calculated using the CKD EPI equation. This calculation has not been validated in all clinical situations. eGFR's persistently <60 mL/min signify possible Chronic Kidney Disease.    Anion gap 8 5 - 15  Troponin I     Status: None   Collection Time: 05/10/16  3:22 AM  Result Value Ref Range   Troponin I <0.03 <0.03 ng/mL  Urinalysis, Routine w reflex microscopic (not at [facility])     Status: Abnormal   Collection Time: 05/10/16  4:15 AM  Result Value Ref Range   Color, Urine YELLOW YELLOW   APPearance CLEAR CLEAR   Specific Gravity, Urine 1.011 1.005 - 1.030   pH 5.5 5.0 - 8.0   Glucose, UA NEGATIVE NEGATIVE mg/dL   Hgb urine dipstick MODERATE (A)  NEGATIVE   Bilirubin Urine NEGATIVE NEGATIVE   Ketones, ur NEGATIVE NEGATIVE mg/dL   Protein, ur NEGATIVE NEGATIVE mg/dL   Nitrite NEGATIVE NEGATIVE   Leukocytes, UA NEGATIVE NEGATIVE  Urine rapid drug screen (hosp performed)not at [facility]     Status: None   Collection Time: 05/10/16  4:15 AM  Result Value Ref Range   Opiates NONE  DETECTED NONE DETECTED   Cocaine NONE DETECTED NONE DETECTED   Benzodiazepines NONE DETECTED NONE DETECTED   Amphetamines NONE DETECTED NONE DETECTED   Tetrahydrocannabinol NONE DETECTED NONE DETECTED   Barbiturates NONE DETECTED NONE DETECTED    Comment:        DRUG SCREEN FOR MEDICAL PURPOSES ONLY.  IF CONFIRMATION IS NEEDED FOR ANY PURPOSE, NOTIFY LAB WITHIN 5 DAYS.        LOWEST DETECTABLE LIMITS FOR URINE DRUG SCREEN Drug Class       Cutoff (ng/mL) Amphetamine      1000 Barbiturate      200 Benzodiazepine   536 Tricyclics       468 Opiates          300 Cocaine          300 THC              50   Urine microscopic-add on     Status: Abnormal   Collection Time: 05/10/16  4:15 AM  Result Value Ref Range   Squamous Epithelial / LPF 6-30 (A) NONE SEEN   WBC, UA 0-5 0 - 5 WBC/hpf   RBC / HPF 6-30 0 - 5 RBC/hpf   Bacteria, UA FEW (A) NONE SEEN   Urine-Other MUCOUS PRESENT   Ethanol     Status: None   Collection Time: 05/10/16  4:35 AM  Result Value Ref Range   Alcohol, Ethyl (B) <5 <5 mg/dL    Comment:        LOWEST DETECTABLE LIMIT FOR SERUM ALCOHOL IS 5 mg/dL FOR MEDICAL PURPOSES ONLY   Magnesium     Status: None   Collection Time: 05/10/16 10:56 AM  Result Value Ref Range   Magnesium 1.7 1.7 - 2.4 mg/dL  Phosphorus     Status: None   Collection Time: 05/10/16 10:56 AM  Result Value Ref Range   Phosphorus 3.1 2.5 - 4.6 mg/dL  Troponin I     Status: None   Collection Time: 05/10/16 10:56 AM  Result Value Ref Range   Troponin I <0.03 <0.03 ng/mL  TSH     Status: None   Collection Time: 05/10/16 10:56 AM  Result Value Ref Range   TSH  1.650 0.350 - 4.500 uIU/mL    Comment: Performed by a 3rd Generation assay with a functional sensitivity of <=0.01 uIU/mL.    Current Facility-Administered Medications  Medication Dose Route Frequency Provider Last Rate Last Dose  .  stroke: mapping our early stages of recovery book   Does not apply Once Waldemar Dickens, MD      . 0.45 % sodium chloride infusion   Intravenous Continuous Waldemar Dickens, MD 75 mL/hr at 05/10/16 1204 1,000 mL at 05/10/16 1204  . acetaminophen (TYLENOL) tablet 650 mg  650 mg Oral Q6H PRN Waldemar Dickens, MD       Or  . acetaminophen (TYLENOL) suppository 650 mg  650 mg Rectal Q6H PRN Waldemar Dickens, MD      . Derrill Memo ON 05/11/2016] aspirin suppository 300 mg  300 mg Rectal Daily Waldemar Dickens, MD       Or  . Derrill Memo ON 05/11/2016] aspirin tablet 325 mg  325 mg Oral Daily Waldemar Dickens, MD      . enoxaparin (LOVENOX) injection 40 mg  40 mg Subcutaneous Q24H Waldemar Dickens, MD   40 mg at 05/10/16 1204  . gi cocktail (Maalox,Lidocaine,Donnatal)  30 mL Oral QID PRN Grayling Congress  Marily Memos, MD      . ketorolac (TORADOL) 15 MG/ML injection 15 mg  15 mg Intravenous Q6H PRN Waldemar Dickens, MD      . meclizine (ANTIVERT) tablet 25 mg  25 mg Oral TID PRN Waldemar Dickens, MD      . ondansetron [facility]) tablet 4 mg  4 mg Oral Q6H PRN Waldemar Dickens, MD       Or  . ondansetron [facility]) injection 4 mg  4 mg Intravenous Q6H PRN Waldemar Dickens, MD      . oxyCODONE (Oxy IR/ROXICODONE) immediate release tablet 5 mg  5 mg Oral Q4H PRN Waldemar Dickens, MD      . sodium chloride flush (NS) 0.9 % injection 3 mL  3 mL Intravenous Q12H Waldemar Dickens, MD   3 mL at 05/10/16 1100  . traZODone (DESYREL) tablet 25 mg  25 mg Oral QHS PRN Waldemar Dickens, MD        Musculoskeletal: Strength & Muscle Tone: within normal limits Gait & Station: normal Patient leans: N/A  Psychiatric Specialty Exam: Physical Exam as per history and physical   ROS patient has mild dizziness on admission  and currently denied nausea, vomiting, abdominal pain, shortness of breath and chest pain No Fever-chills, No Headache, No changes with Vision or hearing, reports vertigo No problems swallowing food or Liquids, No Chest pain, Cough or Shortness of Breath, No Abdominal pain, No Nausea or Vommitting, Bowel movements are regular, No Blood in stool or Urine, No dysuria, No new skin rashes or bruises, No new joints pains-aches,  No new weakness, tingling, numbness in any extremity, No recent weight gain or loss, No polyuria, polydypsia or polyphagia,   A full 10 point Review of Systems was done, except as stated above, all other Review of Systems were negative.  Blood pressure (!) 100/30, pulse (!) 47, temperature 98.3 F (36.8 C), temperature source Oral, resp. rate 14, height 5' 3"  (1.6 m), weight 58.4 kg (128 lb 11.2 oz), SpO2 99 %.Body mass index is 22.8 kg/m.  General Appearance: Casual  Eye Contact:  Good  Speech:  Clear and Coherent  Volume:  Normal  Mood:  Euthymic  Affect:  Appropriate and Congruent  Thought Process:  Coherent and Goal Directed  Orientation:  Full (Time, Place, and Person)  Thought Content:  WDL and Tangential occasionally, needs redirection  Suicidal Thoughts:  No  Homicidal Thoughts:  No  Memory:  Immediate;   Good Recent;   Good Remote;   Fair  Judgement:  Intact  Insight:  Fair  Psychomotor Activity:  Normal  Concentration:  Concentration: Good and Attention Span: Fair  Recall:  Good  Fund of Knowledge:  Good  Language:  Good  Akathisia:  Negative  Handed:  Right  AIMS (if indicated):     Assets:  Communication Skills Desire for Improvement Leisure Time Resilience Transportation  ADL's:  Intact  Cognition:  WNL  Sleep:        Treatment Plan Summary: Imaya Duffy is a 82 years old female admitted to [facility] with dizziness and workup. Patient reportedly staying in shelters/assisted living facilities over 3 and half years except  brief period stayed with the boyfriend which she ended up about 3 months ago. Patient denies depression, anxiety, psychosis, disturbance of sleep and appetite, suicidal and homicidal ideation. Patient does endorse symptoms does not get along with the staff at the assisted living facility.  Reviewed labs which are within normal  limits except mild elevation of WBC and urine drug screen is negative for drugs of abuse   Patient has no safety concerns  Patient has no major psychiatric disorders  Not recommended psychiatric medications  Patient does not need further psychiatric services  Patient will be considered psychiatrically cleared  Appreciate psychiatric consultation and we sign off as of today Please contact [phone] or [phone] if needs further assistance   Disposition: Supportive therapy provided about ongoing stressors.  Ambrose Finland, MD 05/10/2016 1:29 PM

## 2016-05-11 ENCOUNTER — Observation Stay (HOSPITAL_COMMUNITY): Payer: Medicare HMO

## 2016-05-11 ENCOUNTER — Encounter (HOSPITAL_COMMUNITY): Payer: Self-pay | Admitting: Cardiology

## 2016-05-11 ENCOUNTER — Observation Stay (HOSPITAL_BASED_OUTPATIENT_CLINIC_OR_DEPARTMENT_OTHER): Payer: Medicare HMO

## 2016-05-11 DIAGNOSIS — R42 Dizziness and giddiness: Secondary | ICD-10-CM | POA: Diagnosis not present

## 2016-05-11 DIAGNOSIS — R079 Chest pain, unspecified: Secondary | ICD-10-CM

## 2016-05-11 DIAGNOSIS — F4329 Adjustment disorder with other symptoms: Secondary | ICD-10-CM

## 2016-05-11 DIAGNOSIS — R001 Bradycardia, unspecified: Secondary | ICD-10-CM

## 2016-05-11 DIAGNOSIS — I495 Sick sinus syndrome: Secondary | ICD-10-CM

## 2016-05-11 DIAGNOSIS — F05 Delirium due to known physiological condition: Secondary | ICD-10-CM

## 2016-05-11 LAB — LIPID PANEL
CHOL/HDL RATIO: 6.2 ratio
CHOLESTEROL: 205 mg/dL — AB (ref 0–200)
HDL: 33 mg/dL — ABNORMAL LOW (ref >40)
LDL Cholesterol: 143 mg/dL — ABNORMAL HIGH (ref 0–99)
TRIGLYCERIDES: 146 mg/dL (ref <150)
VLDL: 29 mg/dL (ref 0–40)

## 2016-05-11 LAB — CBC
HCT: 37.3 % (ref 36.0–46.0)
Hemoglobin: 12.6 g/dL (ref 12.0–15.0)
MCH: 32.1 pg (ref 26.0–34.0)
MCHC: 33.8 g/dL (ref 30.0–36.0)
MCV: 94.9 fL (ref 78.0–100.0)
PLATELETS: 255 10*3/uL (ref 150–400)
RBC: 3.93 MIL/uL (ref 3.87–5.11)
RDW: 12.5 % (ref 11.5–15.5)
WBC: 8.6 10*3/uL (ref 4.0–10.5)

## 2016-05-11 LAB — BASIC METABOLIC PANEL
Anion gap: 7 (ref 5–15)
BUN: 12 mg/dL (ref 6–20)
CHLORIDE: 109 mmol/L (ref 101–111)
CO2: 24 mmol/L (ref 22–32)
Calcium: 8.8 mg/dL — ABNORMAL LOW (ref 8.9–10.3)
Creatinine, Ser: 0.82 mg/dL (ref 0.44–1.00)
GFR calc Af Amer: >60 mL/min (ref >60)
GLUCOSE: 115 mg/dL — AB (ref 65–99)
POTASSIUM: 4.3 mmol/L (ref 3.5–5.1)
Sodium: 140 mmol/L (ref 135–145)

## 2016-05-11 LAB — ECHOCARDIOGRAM COMPLETE
Height: 63 in
Weight: 2096 oz

## 2016-05-11 LAB — HEMOGLOBIN A1C
Hgb A1c MFr Bld: 5.4 % (ref 4.8–5.6)
MEAN PLASMA GLUCOSE: 108 mg/dL

## 2016-05-11 MED ORDER — SODIUM CHLORIDE 0.9 % IV SOLN
INTRAVENOUS | Status: DC
  Administered 2016-05-11: 1000 mL via INTRAVENOUS
  Administered 2016-05-12: 75 mL/h via INTRAVENOUS

## 2016-05-11 NOTE — Evaluation (Signed)
Occupational Therapy Evaluation Patient Details Name: Cassidy BeardDolores R [name] MRN: [id number] DOB: 09/24/1933 Today's Date: 05/11/2016    History of Present Illness Patient is a 82 y/o female with hx of arthritis and HTN  presenting with multiple vague complaints- dizziness, chest pain. HEad CT-unremarkable.    Clinical Impression   Patient evaluated by Occupational Therapy with no further acute OT needs identified. All education has been completed and the patient has no further questions. Pt requires supervision for ADLs.  RHR 55, HR during activity 67.   No acute needs identified.  See below for any follow-up Occupational Therapy or equipment needs. OT is signing off. Thank you for this referral.      Follow Up Recommendations  No OT follow up;Supervision/Assistance - 24 hour    Equipment Recommendations  None recommended by OT    Recommendations for Other Services       Precautions / Restrictions Precautions Precautions: Fall Restrictions Weight Bearing Restrictions: No      Mobility Bed Mobility Overal bed mobility: Independent             General bed mobility comments: supine to sit without rail  Transfers Overall transfer level: Needs assistance Equipment used: Rolling walker (2 wheeled);None Transfers: Sit to/from RaytheonStand;Stand Pivot Transfers Sit to Stand: Supervision Stand pivot transfers: Supervision       General transfer comment: min safety cues, supervision toilet transfer    Balance Overall balance assessment: Needs assistance Sitting-balance support: Feet supported Sitting balance-Leahy Scale: Good Sitting balance - Comments: able to perform own toileting hygeine   Standing balance support: During functional activity;No upper extremity supported Standing balance-Leahy Scale: Good Standing balance comment: washed hands at sink with good balance, reach for soap/towels                            ADL Overall ADL's : Needs  assistance/impaired Eating/Feeding: Independent   Grooming: Wash/dry hands;Wash/dry face;Oral care;Brushing hair;Supervision/safety;Standing   Upper Body Bathing: Set up;Sitting   Lower Body Bathing: Supervison/ safety;Sit to/from stand Lower Body Bathing Details (indicate cue type and reason): simulated standing.  Pt requires UE support to bathe feet  Upper Body Dressing : Set up;Sitting   Lower Body Dressing: Supervision/safety;Sit to/from stand   Toilet Transfer: Supervision/safety;Ambulation;RW;Comfort height toilet   Toileting- Clothing Manipulation and Hygiene: Supervision/safety;Sit to/from stand       Functional mobility during ADLs: Supervision/safety;Rolling walker General ADL Comments: Pt able to access cabinets and drawers to retrieve ADL items with supervision - min guard assist.  Instructed her how to maneuver RW safely through narrow spaces      Vision     Perception     Praxis      Pertinent Vitals/Pain Pain Assessment: No/denies pain     Hand Dominance Right   Extremity/Trunk Assessment Upper Extremity Assessment Upper Extremity Assessment: Overall WFL for tasks assessed   Lower Extremity Assessment Lower Extremity Assessment: Defer to PT evaluation   Cervical / Trunk Assessment Cervical / Trunk Assessment: Normal   Communication Communication Communication: No difficulties   Cognition Arousal/Alertness: Awake/alert Behavior During Therapy: WFL for tasks assessed/performed Overall Cognitive Status: Within Functional Limits for tasks assessed (Pt tangential )                     General Comments       Exercises       Shoulder Instructions      Home Living Family/patient expects to  be discharged to:: Assisted living                             Home Equipment: Walker - 4 wheels          Prior Functioning/Environment Level of Independence: Independent with assistive device(s)        Comments: Uses rollator  for mobility.         OT Problem List: Decreased strength   OT Treatment/Interventions:      OT Goals(Current goals can be found in the care plan section) Acute Rehab OT Goals Patient Stated Goal: get stronger OT Goal Formulation: All assessment and education complete, DC therapy  OT Frequency:     Barriers to D/C:            Co-evaluation              End of Session Equipment Utilized During Treatment: Gait belt;Rolling walker Nurse Communication: Mobility status  Activity Tolerance: Patient tolerated treatment well Patient left: in chair;with call bell/phone within reach   Time: 1610-9604 OT Time Calculation (min): 18 min Charges:  OT General Charges $OT Visit: 1 Procedure OT Evaluation $OT Eval Low Complexity: 1 Procedure G-Codes: OT G-codes **NOT FOR INPATIENT CLASS** Functional Limitation: Self care Self Care Current Status (V4098): At least 1 percent but less than 20 percent impaired, limited or restricted Self Care Goal Status (J1914): At least 1 percent but less than 20 percent impaired, limited or restricted Self Care Discharge Status [phone]): At least 1 percent but less than 20 percent impaired, limited or restricted  Cassidy [name] M 05/11/2016, 12:45 PM

## 2016-05-11 NOTE — NC FL2 (Deleted)
[place] MEDICAID FL2 LEVEL OF CARE SCREENING TOOL     IDENTIFICATION  Patient Name: Cassidy [name] Birthdate: 03/27/1934 Sex: female Admission Date (Current Location): 05/10/2016  County and Medicaid Number:  [place]   Facility and Address:  [facility], [address], [place], [place] [address]      Provider Number: [id number]  Attending Physician Name and Address:  Nayana Abrol, MD  Relative Name and Phone Number:       Current Level of Care: Hospital Recommended Level of Care: Assisted Living Facility Prior Approval Number:    Date Approved/Denied:   PASRR Number:    Discharge Plan:  (ALF)    Current Diagnoses: Patient Active Problem List   Diagnosis Date Noted  . Chest pain, rule out acute myocardial infarction 05/10/2016  . Dizziness 05/10/2016  . Bradycardia 05/10/2016  . Subacute confusional state 05/10/2016  . Adjustment disorder with other symptoms 05/10/2016    Orientation RESPIRATION BLADDER Height & Weight     Self, Time, Situation, Place  Normal Continent Weight: 131 lb (59.4 kg) Height:  5' 3" (160 cm)  BEHAVIORAL SYMPTOMS/MOOD NEUROLOGICAL BOWEL NUTRITION STATUS      Continent Diet (Heart Healthy; thin liquids)  AMBULATORY STATUS COMMUNICATION OF NEEDS Skin   Limited Assist Verbally Normal                       Personal Care Assistance Level of Assistance  Bathing, Feeding, Dressing Bathing Assistance: Limited assistance Feeding assistance: Independent Dressing Assistance: Limited assistance     Functional Limitations Info  Sight, Hearing, Speech Sight Info: Adequate Hearing Info: Adequate Speech Info: Adequate    SPECIAL CARE FACTORS FREQUENCY                       Contractures Contractures Info: Not present    Additional Factors Info  Code Status, Allergies Code Status Info: Full Allergies Info: Ciprofloxacin Hcl, Prednisone, Zantac Ranitidine           Current Medications  (05/11/2016):  This is the current hospital active medication list Current Facility-Administered Medications  Medication Dose Route Frequency Provider Last Rate Last Dose  .  stroke: mapping our early stages of recovery book   Does not apply Once David J Merrell, MD      . 0.45 % sodium chloride infusion   Intravenous Continuous David J Merrell, MD 75 mL/hr at 05/11/16 0236 75 mL/hr at 05/11/16 0236  . acetaminophen (TYLENOL) tablet 650 mg  650 mg Oral Q6H PRN David J Merrell, MD       Or  . acetaminophen (TYLENOL) suppository 650 mg  650 mg Rectal Q6H PRN David J Merrell, MD      . aspirin suppository 300 mg  300 mg Rectal Daily David J Merrell, MD       Or  . aspirin tablet 325 mg  325 mg Oral Daily David J Merrell, MD   325 mg at 05/11/16 0714  . enoxaparin (LOVENOX) injection 40 mg  40 mg Subcutaneous Q24H David J Merrell, MD   40 mg at 05/11/16 0713  . gi cocktail (Maalox,Lidocaine,Donnatal)  30 mL Oral QID PRN David J Merrell, MD      . ketorolac (TORADOL) 15 MG/ML injection 15 mg  15 mg Intravenous Q6H PRN David J Merrell, MD      . meclizine (ANTIVERT) tablet 25 mg  25 mg Oral TID PRN David J Merrell, MD      .   ondansetron (ZOFRAN) tablet 4 mg  4 mg Oral Q6H PRN David J Merrell, MD       Or  . ondansetron (ZOFRAN) injection 4 mg  4 mg Intravenous Q6H PRN David J Merrell, MD      . oxyCODONE (Oxy IR/ROXICODONE) immediate release tablet 5 mg  5 mg Oral Q4H PRN David J Merrell, MD      . sodium chloride flush (NS) 0.9 % injection 3 mL  3 mL Intravenous Q12H David J Merrell, MD   3 mL at 05/10/16 2200  . traZODone (DESYREL) tablet 25 mg  25 mg Oral QHS PRN David J Merrell, MD         Discharge Medications: Please see discharge summary for a list of discharge medications.  Relevant Imaging Results:  Relevant Lab Results:   Additional Information SSN: [id number]  Deontre Allsup [place], LCSWA [phone]   

## 2016-05-11 NOTE — Care Management Obs Status (Signed)
MEDICARE OBSERVATION STATUS NOTIFICATION   Patient Details  Name: Cassidy BeardDolores R Gilland MRN: [id number] Date of Birth: 08/19/1933   Medicare Observation Status Notification Given:  Yes    Gala LewandowskyGraves-Bigelow, Bluma Buresh Kaye, RN 05/11/2016, 11:32 AM

## 2016-05-11 NOTE — Consult Note (Signed)
Reason for Consult: chest pain, bradycardia   Referring Physician: Dr. Allyson Sabal   PCP:  No primary care provider on file.  Primary Cardiologist:New   Cassidy [name] is an 82 y.o. female.    Chief Complaint: pt admitted 05/10/16 after presenting by EMS for dizziness.   She did complain of chest pain.  HPI: 76 yof that is homeless presented to ER by EMS with dizziness, nausea and weakness.  Pt has hx of arthritis, HTN.  Difficult to obtain hx on admit.  Her chest pain was releived with ASA.  EKG with SB at 43 no change from previous except rate. Follow up EKGs remain the same.  On no home meds to slow heart. Not hypotensive.  Troponin < 0.03 X 3 LDL 143;   lytes normal, TSH 1.650 CT head no hemorrhage and + Mild age-related atrophy and chronic microvascular ischemic disease MRI, MRA of the brain without disease but was not able to complete.  CXR NAD  During the night HR 44-48 freq occ to 35, now 61.  No further chest pain.  Difficult to obtain hx as she goes off on discussions of noise of MRI.      Past Medical History:  Diagnosis Date  . Arthritis   . HTN (hypertension)     Past Surgical History:  Procedure Laterality Date  . ABDOMINAL HYSTERECTOMY    . TOTAL KNEE ARTHROPLASTY    . TUBAL LIGATION      Family History  Problem Relation Age of Onset  . Adopted: Yes   Social History:  reports that she quit smoking 10 days ago. Her smoking use included Cigarettes. She has a 10.00 pack-year smoking history. She has never used smokeless tobacco. She reports that she does not drink alcohol or use drugs.  Allergies:  Allergies  Allergen Reactions  . Ciprofloxacin Hcl Other (See Comments)    Reaction: unknown   . Prednisone Other (See Comments)    Reaction: unknown   . Zantac [Ranitidine] Other (See Comments)    Reaction: unknown  "too strong"     OUTPATIENT MEDICATIONS: No current facility-administered medications on file prior to encounter.    No current  outpatient prescriptions on file prior to encounter.    CURRENT MEDICATIONS: Scheduled Meds: .  stroke: mapping our early stages of recovery book   Does not apply Once  . aspirin  300 mg Rectal Daily   Or  . aspirin  325 mg Oral Daily  . enoxaparin (LOVENOX) injection  40 mg Subcutaneous Q24H  . sodium chloride flush  3 mL Intravenous Q12H   Continuous Infusions: . sodium chloride 75 mL/hr (05/11/16 0236)   PRN Meds:.acetaminophen **OR** acetaminophen, gi cocktail, ketorolac, meclizine, ondansetron **OR** ondansetron (ZOFRAN) IV, oxyCODONE, traZODone    Results for orders placed or performed during the hospital encounter of 05/10/16 (from the past 48 hour(s))  CBC with Differential/Platelet     Status: Abnormal   Collection Time: 05/10/16  3:22 AM  Result Value Ref Range   WBC 11.5 (H) 4.0 - 10.5 K/uL   RBC 4.20 3.87 - 5.11 MIL/uL   Hemoglobin 13.1 12.0 - 15.0 g/dL   HCT 38.8 36.0 - 46.0 %   MCV 92.4 78.0 - 100.0 fL   MCH 31.2 26.0 - 34.0 pg   MCHC 33.8 30.0 - 36.0 g/dL   RDW 12.4 11.5 - 15.5 %   Platelets 315 150 - 400 K/uL   Neutrophils Relative % 66 %  Neutro Abs 7.6 1.7 - 7.7 K/uL   Lymphocytes Relative 24 %   Lymphs Abs 2.8 0.7 - 4.0 K/uL   Monocytes Relative 7 %   Monocytes Absolute 0.8 0.1 - 1.0 K/uL   Eosinophils Relative 3 %   Eosinophils Absolute 0.3 0.0 - 0.7 K/uL   Basophils Relative 0 %   Basophils Absolute 0.0 0.0 - 0.1 K/uL  Comprehensive metabolic panel     Status: Abnormal   Collection Time: 05/10/16  3:22 AM  Result Value Ref Range   Sodium 140 135 - 145 mmol/L   Potassium 3.8 3.5 - 5.1 mmol/L   Chloride 106 101 - 111 mmol/L   CO2 26 22 - 32 mmol/L   Glucose, Bld 97 65 - 99 mg/dL   BUN 16 6 - 20 mg/dL   Creatinine, Ser 0.91 0.44 - 1.00 mg/dL   Calcium 9.1 8.9 - 10.3 mg/dL   Total Protein 6.2 (L) 6.5 - 8.1 g/dL   Albumin 3.5 3.5 - 5.0 g/dL   AST 18 15 - 41 U/L   ALT 12 (L) 14 - 54 U/L   Alkaline Phosphatase 55 38 - 126 U/L   Total Bilirubin  0.6 0.3 - 1.2 mg/dL   GFR calc non Af Amer 58 (L) >60 mL/min   GFR calc Af Amer >60 >60 mL/min    Comment: (NOTE) The eGFR has been calculated using the CKD EPI equation. This calculation has not been validated in all clinical situations. eGFR's persistently <60 mL/min signify possible Chronic Kidney Disease.    Anion gap 8 5 - 15  Troponin I     Status: None   Collection Time: 05/10/16  3:22 AM  Result Value Ref Range   Troponin I <0.03 <0.03 ng/mL  Urinalysis, Routine w reflex microscopic (not at [facility])     Status: Abnormal   Collection Time: 05/10/16  4:15 AM  Result Value Ref Range   Color, Urine YELLOW YELLOW   APPearance CLEAR CLEAR   Specific Gravity, Urine 1.011 1.005 - 1.030   pH 5.5 5.0 - 8.0   Glucose, UA NEGATIVE NEGATIVE mg/dL   Hgb urine dipstick MODERATE (A) NEGATIVE   Bilirubin Urine NEGATIVE NEGATIVE   Ketones, ur NEGATIVE NEGATIVE mg/dL   Protein, ur NEGATIVE NEGATIVE mg/dL   Nitrite NEGATIVE NEGATIVE   Leukocytes, UA NEGATIVE NEGATIVE  Urine rapid drug screen (hosp performed)not at [facility]     Status: None   Collection Time: 05/10/16  4:15 AM  Result Value Ref Range   Opiates NONE DETECTED NONE DETECTED   Cocaine NONE DETECTED NONE DETECTED   Benzodiazepines NONE DETECTED NONE DETECTED   Amphetamines NONE DETECTED NONE DETECTED   Tetrahydrocannabinol NONE DETECTED NONE DETECTED   Barbiturates NONE DETECTED NONE DETECTED    Comment:        DRUG SCREEN FOR MEDICAL PURPOSES ONLY.  IF CONFIRMATION IS NEEDED FOR ANY PURPOSE, NOTIFY LAB WITHIN 5 DAYS.        LOWEST DETECTABLE LIMITS FOR URINE DRUG SCREEN Drug Class       Cutoff (ng/mL) Amphetamine      1000 Barbiturate      200 Benzodiazepine   702 Tricyclics       637 Opiates          300 Cocaine          300 THC              50   Urine microscopic-add on     Status:  Abnormal   Collection Time: 05/10/16  4:15 AM  Result Value Ref Range   Squamous Epithelial / LPF 6-30 (A) NONE SEEN   WBC, UA 0-5 0 -  5 WBC/hpf   RBC / HPF 6-30 0 - 5 RBC/hpf   Bacteria, UA FEW (A) NONE SEEN   Urine-Other MUCOUS PRESENT   Ethanol     Status: None   Collection Time: 05/10/16  4:35 AM  Result Value Ref Range   Alcohol, Ethyl (B) <5 <5 mg/dL    Comment:        LOWEST DETECTABLE LIMIT FOR SERUM ALCOHOL IS 5 mg/dL FOR MEDICAL PURPOSES ONLY   Magnesium     Status: None   Collection Time: 05/10/16 10:56 AM  Result Value Ref Range   Magnesium 1.7 1.7 - 2.4 mg/dL  Phosphorus     Status: None   Collection Time: 05/10/16 10:56 AM  Result Value Ref Range   Phosphorus 3.1 2.5 - 4.6 mg/dL  Troponin I     Status: None   Collection Time: 05/10/16 10:56 AM  Result Value Ref Range   Troponin I <0.03 <0.03 ng/mL  TSH     Status: None   Collection Time: 05/10/16 10:56 AM  Result Value Ref Range   TSH 1.650 0.350 - 4.500 uIU/mL    Comment: Performed by a 3rd Generation assay with a functional sensitivity of <=0.01 uIU/mL.  Hemoglobin A1c     Status: None   Collection Time: 05/10/16 10:56 AM  Result Value Ref Range   Hgb A1c MFr Bld 5.4 4.8 - 5.6 %    Comment: (NOTE)         Pre-diabetes: 5.7 - 6.4         Diabetes: >6.4         Glycemic control for adults with diabetes: <7.0    Mean Plasma Glucose 108 mg/dL    Comment: (NOTE) Performed At: [facility] [place], [place] [id number] Lindon Romp MD [id number]   Troponin I     Status: None   Collection Time: 05/10/16  3:48 PM  Result Value Ref Range   Troponin I <0.03 <0.03 ng/mL  Lipid panel     Status: Abnormal   Collection Time: 05/11/16  5:42 AM  Result Value Ref Range   Cholesterol 205 (H) 0 - 200 mg/dL   Triglycerides 146 <150 mg/dL   HDL 33 (L) >40 mg/dL   Total CHOL/HDL Ratio 6.2 RATIO   VLDL 29 0 - 40 mg/dL   LDL Cholesterol 143 (H) 0 - 99 mg/dL    Comment:        Total Cholesterol/HDL:CHD Risk Coronary Heart Disease Risk Table                     Men   Women  1/2 Average Risk   3.4   3.3  Average Risk        5.0   4.4  2 X Average Risk   9.6   7.1  3 X Average Risk  23.4   11.0        Use the calculated Patient Ratio above and the CHD Risk Table to determine the patient's CHD Risk.        ATP III CLASSIFICATION (LDL):  <100     mg/dL   Optimal  100-129  mg/dL   Near or Above  Optimal  130-159  mg/dL   Borderline  160-189  mg/dL   High  >190     mg/dL   Very High   CBC     Status: None   Collection Time: 05/11/16  5:42 AM  Result Value Ref Range   WBC 8.6 4.0 - 10.5 K/uL   RBC 3.93 3.87 - 5.11 MIL/uL   Hemoglobin 12.6 12.0 - 15.0 g/dL   HCT 37.3 36.0 - 46.0 %   MCV 94.9 78.0 - 100.0 fL   MCH 32.1 26.0 - 34.0 pg   MCHC 33.8 30.0 - 36.0 g/dL   RDW 12.5 11.5 - 15.5 %   Platelets 255 150 - 400 K/uL  Basic metabolic panel     Status: Abnormal   Collection Time: 05/11/16  5:42 AM  Result Value Ref Range   Sodium 140 135 - 145 mmol/L   Potassium 4.3 3.5 - 5.1 mmol/L   Chloride 109 101 - 111 mmol/L   CO2 24 22 - 32 mmol/L   Glucose, Bld 115 (H) 65 - 99 mg/dL   BUN 12 6 - 20 mg/dL   Creatinine, Ser 0.82 0.44 - 1.00 mg/dL   Calcium 8.8 (L) 8.9 - 10.3 mg/dL   GFR calc non Af Amer >60 >60 mL/min   GFR calc Af Amer >60 >60 mL/min    Comment: (NOTE) The eGFR has been calculated using the CKD EPI equation. This calculation has not been validated in all clinical situations. eGFR's persistently <60 mL/min signify possible Chronic Kidney Disease.    Anion gap 7 5 - 15   Ct Head Wo Contrast  Result Date: 05/10/2016 CLINICAL DATA:  82 year old female with dizziness EXAM: CT HEAD WITHOUT CONTRAST TECHNIQUE: Contiguous axial images were obtained from the base of the skull through the vertex without intravenous contrast. COMPARISON:  None. FINDINGS: Brain: The ventricles and sulci are appropriate in size for patient's age. Mild periventricular and deep white matter chronic microvascular ischemic changes noted. There is no acute intracranial hemorrhage. No mass effect or  midline shift noted. Vascular: No hyperdense vessel or unexpected calcification. Skull: Normal. Negative for fracture or focal lesion. Sinuses/Orbits: There is partial opacification of multiple right mastoid air cells. The left mastoid air cells and paranasal sinuses are clear. Other: None IMPRESSION: No acute intracranial hemorrhage. Mild age-related atrophy and chronic microvascular ischemic disease. If symptoms persist and there are no contraindications, MRI may provide better evaluation if clinically indicated Electronically Signed   By: Anner Crete M.D.   On: 05/10/2016 06:12   Mr Brain Wo Contrast  Result Date: 05/10/2016 CLINICAL DATA:  Dizziness EXAM: MRI HEAD WITHOUT CONTRAST MRA HEAD WITHOUT CONTRAST TECHNIQUE: Multiplanar, multiecho pulse sequences of the brain and surrounding structures were obtained without intravenous contrast. Angiographic images of the head were obtained using MRA technique without contrast. COMPARISON:  CT head 05/10/2016 FINDINGS: MRI HEAD FINDINGS Brain: Generalized mild atrophy. Negative for hydrocephalus. Negative for acute infarct. Chronic microvascular ischemic change in the white matter and pons. The patient was not able to complete the study and several sequences were omitted. Vascular: Normal flow void Skull and upper cervical spine: Negative Sinuses/Orbits: Right mastoid sinus effusion. No obstructing nasopharyngeal mass. Mild mucosal edema paranasal sinuses. No orbital lesion. Other: None MRA HEAD FINDINGS Both vertebral arteries widely patent to the basilar. PICA patent bilaterally. Basilar widely patent. Superior cerebellar and posterior cerebral arteries appear normal bilaterally Cavernous carotid is normal bilaterally. Anterior and middle cerebral arteries are normal bilaterally. No stenosis  or large vessel occlusion Negative for cerebral aneurysm. IMPRESSION: Atrophy and chronic microvascular ischemic change. No acute intracranial abnormality. Note the  patient was not able to complete this MRI of the brain Negative MRA head Electronically Signed   By: Franchot Gallo M.D.   On: 05/10/2016 19:25   Dg Chest Port 1 View  Result Date: 05/10/2016 CLINICAL DATA:  Diffuse chest pain EXAM: PORTABLE CHEST 1 VIEW COMPARISON:  01/14/2016 FINDINGS: Normal heart size and mediastinal contours. No acute infiltrate or edema. No effusion or pneumothorax. Bulky spondylosis. Osteopenia. No acute osseous findings. IMPRESSION: No active disease. Electronically Signed   By: Monte Fantasia M.D.   On: 05/10/2016 07:45   Mr Jodene Nam Headm  Result Date: 05/10/2016 CLINICAL DATA:  Dizziness EXAM: MRI HEAD WITHOUT CONTRAST MRA HEAD WITHOUT CONTRAST TECHNIQUE: Multiplanar, multiecho pulse sequences of the brain and surrounding structures were obtained without intravenous contrast. Angiographic images of the head were obtained using MRA technique without contrast. COMPARISON:  CT head 05/10/2016 FINDINGS: MRI HEAD FINDINGS Brain: Generalized mild atrophy. Negative for hydrocephalus. Negative for acute infarct. Chronic microvascular ischemic change in the white matter and pons. The patient was not able to complete the study and several sequences were omitted. Vascular: Normal flow void Skull and upper cervical spine: Negative Sinuses/Orbits: Right mastoid sinus effusion. No obstructing nasopharyngeal mass. Mild mucosal edema paranasal sinuses. No orbital lesion. Other: None MRA HEAD FINDINGS Both vertebral arteries widely patent to the basilar. PICA patent bilaterally. Basilar widely patent. Superior cerebellar and posterior cerebral arteries appear normal bilaterally Cavernous carotid is normal bilaterally. Anterior and middle cerebral arteries are normal bilaterally. No stenosis or large vessel occlusion Negative for cerebral aneurysm. IMPRESSION: Atrophy and chronic microvascular ischemic change. No acute intracranial abnormality. Note the patient was not able to complete this MRI of  the brain Negative MRA head Electronically Signed   By: Franchot Gallo M.D.   On: 05/10/2016 19:25    ROS: General:no colds or fevers, no weight changes Skin:no rashes or ulcers HEENT:no blurred vision, no congestion CV:see HPI PUL:see HPI GI:no diarrhea constipation or melena, no indigestion GU:no hematuria, no dysuria MS:no joint pain, no claudication Neuro:no syncope, no lightheadedness, + numbness of feet Endo:no diabetes, no thyroid disease   Blood pressure 131/68, pulse (!) 56, temperature 97.5 F (36.4 C), temperature source Oral, resp. rate 18, height 5' 3"  (1.6 m), weight 131 lb (59.4 kg), SpO2 98 %.  Wt Readings from Last 3 Encounters:  05/11/16 131 lb (59.4 kg)  02/17/16 124 lb (56.2 kg)    PE: General:Pleasant affect, NAD Skin:Warm and dry, brisk capillary refill HEENT:normocephalic, sclera clear, mucus membranes moist Neck:supple, no JVD, no bruits  Heart:S1S2 RRR without murmur, gallup, rub or click Lungs:clear without rales, rhonchi, or wheezes SWF:UXNA, non tender, + BS, do not palpate liver spleen or masses Ext:no lower ext edema, 2+ pedal pulses, 2+ radial pulses Neuro:alert and oriented, MAE, follows commands, + facial symmetry   Assessment/Plan Principal Problem:   Adjustment disorder with other symptoms Active Problems:   Chest pain, rule out acute myocardial infarction   Dizziness   Bradycardia   Subacute confusional state   Chest pain with relief with ASA, neg troponin may need nuc or Echo study MD to see, pt did eat this AM  Bradycardia with dizziness to HR to 35 no hypotension.  May need EP consult.  Adjustment disorder per IM.   Cecilie Kicks  Nurse Practitioner Certified [place] Pager [phone] or after 5pm  or weekends call [phone] 05/11/2016, 9:11 AM

## 2016-05-11 NOTE — Progress Notes (Signed)
Notified by CCMD that pt HR dropped to 35.  Pt sleeping and resting comfortably, HR now in the 40's.  Will continue to closely monitor.

## 2016-05-11 NOTE — Progress Notes (Signed)
Physical Therapy Treatment Patient Details Name: Cassidy [name] MRN: [id number] DOB: 04/23/1934 Today's Date: 05/11/2016    History of Present Illness Patient is a 82 y/o female with hx of arthritis and HTN  presenting with multiple vague complaints- dizziness, chest pain. HEad CT-unremarkable.     PT Comments    Pt with slowed HR prior to initiating treatment.  [facility], therefore recommend ongoing supervision with mobility.  She reports she typically wears prism lens which she does not have today, therefore, she bumped into objects on Rt (floor caution signs) while in hall. Will continue to follow patient while on this venue of care to progress mobility.   Follow Up Recommendations  No PT follow up;Supervision - Intermittent     Equipment Recommendations  None recommended by PT    Recommendations for Other Services       Precautions / Restrictions Precautions Precautions: Fall Restrictions Weight Bearing Restrictions: No    Mobility  Bed Mobility Overal bed mobility: Independent             General bed mobility comments: supine to sit without rail  Transfers Overall transfer level: Needs assistance Equipment used: Rolling walker (2 wheeled) Transfers: Sit to/from UGI CorporationStand;Stand Pivot Transfers Sit to Stand: Supervision Stand pivot transfers: Supervision       General transfer comment: min safety cues, supervision toilet transfer  Ambulation/Gait Ambulation/Gait assistance: Min guard Ambulation Distance (Feet): 400 Feet Assistive device: Rolling walker (2 wheeled) Gait Pattern/deviations: WFL(Within Functional Limits) Gait velocity: 24' in 11.11 sec = 2.16 ft/sec Gait velocity interpretation: Below normal speed for age/gender General Gait Details: bumped into objects on Rt x 2 episodes, min cues to scan environment   Stairs            Wheelchair Mobility    Modified Rankin (Stroke Patients Only)        Balance Overall balance assessment: Needs assistance Sitting-balance support: No upper extremity supported;Feet supported Sitting balance-Leahy Scale: Good Sitting balance - Comments: able to perform own toileting hygeine   Standing balance support: No upper extremity supported Standing balance-Leahy Scale: Fair Standing balance comment: washed hands at sink with good balance, reach for soap/towels                    Cognition Arousal/Alertness: Awake/alert Behavior During Therapy: WFL for tasks assessed/performed Overall Cognitive Status: Within Functional Limits for tasks assessed                      Exercises      General Comments General comments (skin integrity, edema, etc.): pre-exercise HR 46-52 bpm, HR during ex 64 bpm, no dyspnea      Pertinent Vitals/Pain Pain Assessment: No/denies pain    Home Living                      Prior Function            PT Goals (current goals can now be found in the care plan section) Acute Rehab PT Goals Patient Stated Goal: get stronger PT Goal Formulation: With patient Time For Goal Achievement: 05/24/16 Potential to Achieve Goals: Good Progress towards PT goals: Progressing toward goals    Frequency    Min 3X/week      PT Plan Current plan remains appropriate    Co-evaluation             End of Session Equipment Utilized During Treatment: Gait  belt Activity Tolerance: Patient tolerated treatment well Patient left: in chair;with call bell/phone within reach     Time: 0958-1037 PT Time Calculation (min) (ACUTE ONLY): 39 min  Charges:  $Gait Training: 23-37 mins $Therapeutic Activity: 8-22 mins                    G Codes:  Functional Assessment Tool Used: gait velocity, gait in hall Functional Limitation: Mobility: Walking and moving around Mobility: Walking and Moving Around Current Status [phone]): At least 1 percent but less than 20 percent impaired, limited or  restricted Mobility: Walking and Moving Around Goal Status [phone]): At least 1 percent but less than 20 percent impaired, limited or restricted  Nestor Lewandowsky, [place] [phone]  Sophy Mesler 05/11/2016, 11:15 AM

## 2016-05-11 NOTE — Clinical Social Work Note (Signed)
Clinical Social Work Assessment  Patient Details  Name: Cassidy [name] MRN: [id number] Date of Birth: June 29, 1934  Date of referral:  05/11/16               Reason for consult:  Facility Placement, Discharge Planning, Mental Health Concerns                Permission sought to share information with:  Chartered certified accountant granted to share information::     Name::        Agency::  [name]  Relationship::     Contact Information:     Housing/Transportation Living arrangements for the past 2 months:  [place] of Information:  Patient Patient Interpreter Needed:  None Criminal Activity/Legal Involvement Pertinent to Current Situation/Hospitalization:  No - Comment as needed Significant Relationships:  None Lives with:    Do you feel safe going back to the place where you live?  Yes Need for family participation in patient care:  No (Coment)  Care giving concerns:  The patient shares many concerns about her tenuous relationship with staff at [name].   Social Worker assessment / plan:  CSW met with patient at bedside to complete assessment. The patient shared that she was living in and out of shelters for about 3 years and most recently was placed at [name]. Initially the patient called this facility "[name]" but after researching the address CSW was able to determine that the patient is from [name]. The patient states that she does not like the facility resident care director Phillinda. CSW explained that new ALF placement cannot be pursued from the hospital and the patient will need to make her concerns known to the facility administrator. The patient is agreeable to returning to the facility at discharging and addressing her concerns with Ms. Kellie Simmering Production designer, theatre/television/film for the facility). With the patient's permission, CM contacted Phillinda with the facility. Phillinda states that the facility feels the patient has some  underlying mental health concerns. Phillinda reports [name] is agreeable to taking the patient back. CSW will continue to follow and assist.    Employment status:  Retired Nurse, adult, Medicaid [place] PT Recommendations:  No Follow Up Information / Referral to community resources:  Other (Comment Required) ([name] will be updated.)  Patient/Family's Response to care:  The member states she is happy with the care she is receiving and appreciates CSW's assistance.  Patient/Family's Understanding of and Emotional Response to Diagnosis, Current Treatment, and Prognosis:  The patient appears to have fair insight into reason for her admission. She understands that she will need to return to [name] at time of discharge.   Emotional Assessment Appearance:  Appears younger than stated age Attitude/Demeanor/Rapport:  Other (Patient is appropriate and welcoming of CSW.) Affect (typically observed):  Accepting, Appropriate, Calm, Pleasant Orientation:  Oriented to Self, Oriented to Place, Oriented to  Time, Oriented to Situation Alcohol / Substance use:  Not Applicable Psych involvement (Current and /or in the community):  Yes (Comment) (Has been evaluated by psych, no significant mental health diagnosis noted.)  Discharge Needs  Concerns to be addressed:  Discharge Planning Concerns Readmission within the last 30 days:  No Current discharge risk:  Chronically ill Barriers to Discharge:  Continued Medical Work up   [place], LCSW 05/11/2016, 1:36 AM

## 2016-05-11 NOTE — Progress Notes (Signed)
*  PRELIMINARY RESULTS* Echocardiogram 2D Echocardiogram has been performed.  Jeryl Columbialliott, Keyle Doby 05/11/2016, 2:22 PM

## 2016-05-11 NOTE — Consult Note (Signed)
CARDIOLOGY CONSULT NOTE   Patient ID: Cassidy BeardDolores R Smitherman MRN: [id number] DOB/AGE: 80/10/1933 82 y.o.  Admit date: 05/10/2016  Requesting Physician: Dr. Duke Salviaandolph Primary Physician:   No primary care provider on file. Primary Cardiologist:  New Reason for Consultation: sinus bradycardia   HPI: Cassidy [name] is a 82 y.o. female with a history of HTN, arthritis, dementia, HLD, tobacco abuse, homelessness and adjustment disorder who [facility]10[facility]16 complaining of chest pain and dizziness.   She currently lives in a homeless shelter. She has been in various shelters and assisted living facilities for the past 3 years or so. She was admitted last night with vague complaints. Diffiuclt to follow secondary to tangential thought process.   Troponin < 0.03 X 3 LDL 143;  lytes normal, TSH 1.650. Head CT with no acute intracranial hemorrhage and mild age-related atrophy and chronic microvascular ischemic disease. 2D ECHO has been ordered but not completed. ECG with sinus bradycardia. HR 42. Tele shows HR in 50s. She was evaluated by psychiatry this admission who felt she was mentally sound.   Currently feeling the best she has felt in days. 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 She was in her usual state of good health until a couple days ago when she started feel nauseated and like her head was going to be "screwed off." She begged the facility to call EMS. She does not recall any chest pain or dizziness, which are listed as her chief complaint in H&P. Currently she is feeling back to normal and "great." No LE edema, orthopnea or PND. No syncope.  She has smoked intermittently since her teenage years but quit cold [place] last week. No other illicit drugs. She hasn't been taking any medications. Her med list lists Aricept which she  has not taken in years.    Past Medical History:  Diagnosis Date  . Arthritis   . HTN (hypertension)      Past Surgical History:  Procedure Laterality Date  . ABDOMINAL HYSTERECTOMY    . TOTAL KNEE ARTHROPLASTY    . TUBAL LIGATION      Allergies  Allergen Reactions  . Ciprofloxacin Hcl Other (See Comments)    Reaction: unknown   . Prednisone Other (See Comments)    Reaction: unknown   . Zantac [Ranitidine] Other (See Comments)    Reaction: unknown  "too strong"     I have reviewed the patient's current medications .  stroke: mapping our early stages of recovery book   Does not apply Once  . aspirin  300 mg Rectal Daily   Or  . aspirin  325 mg Oral Daily  . enoxaparin (LOVENOX) injection  40 mg Subcutaneous Q24H  . sodium chloride flush  3 mL Intravenous Q12H   . sodium chloride 75 mL/hr (05/11/16 0236)   acetaminophen **OR** acetaminophen, gi cocktail, ketorolac, meclizine, ondansetron **OR** ondansetron (ZOFRAN) IV, oxyCODONE, traZODone  Prior to Admission medications   Medication Sig Start Date End Date Taking? Authorizing Provider  donepezil (ARICEPT) 5 MG tablet Take 5 mg by mouth at bedtime.   Yes Historical Provider, MD  lactobacillus acidophilus (BACID) TABS tablet Take 1 tablet by mouth daily. For 30 days 05/09/16 06/07/16 Yes Historical Provider, MD  meloxicam (MOBIC) 15 MG tablet Take 15 mg by mouth daily.   Yes  Historical Provider, MD  Multiple Vitamin (MULTIVITAMIN WITH MINERALS) TABS tablet Take 1 tablet by mouth daily.   Yes Historical Provider, MD  sertraline (ZOLOFT) 25 MG tablet Take 25 mg by mouth at bedtime. 05/09/16  Yes Historical Provider, MD  triamterene-hydrochlorothiazide (MAXZIDE-25) 37.5-25 MG tablet Take 1 tablet by mouth daily.   Yes Historical Provider, MD     Social History   Social History  . Marital status: Married    Spouse name: N/A  . Number of children: N/A  . Years of education: N/A   Occupational History  . Not on file.     Social History Main Topics  . Smoking status: Former Smoker    Packs/day: 1.00    Years: 10.00    Types: Cigarettes    Quit date: 05/01/2016  . Smokeless tobacco: Never Used  . Alcohol use No  . Drug use: No  . Sexual activity: Not on file   Other Topics Concern  . Not on file   Social History Narrative  . No narrative on file    No family status information on file.   Family History  Problem Relation Age of Onset  . Adopted: Yes    ROS:  Full 14 point review of systems complete and found to be negative unless listed above.  Physical Exam: Blood pressure 131/68, pulse (!) 56, temperature 97.5 F (36.4 C), temperature source Oral, resp. rate 18, height 5\' 3"  (1.6 m), weight 131 lb (59.4 kg), SpO2 98 %.  General: Well developed, well nourished, female in no acute distress, disheveled but looks younger than stated age. Head: Eyes PERRLA, No xanthomas.   Normocephalic and atraumatic, oropharynx without edema or exudate. Dentition:  Lungs: CTAB Heart: HRRR S1 S2, no rub/gallop, Heart regular rate and rhythm with S1, S2  murmur. pulses are 2+ extrem.   Neck: No carotid bruits. No lymphadenopathy. No  JVD. Abdomen: Bowel sounds present, abdomen soft and non-tender without masses or hernias noted. Msk:  No spine or cva tenderness. No weakness, no joint deformities or effusions. Extremities: No clubbing or cyanosis. No LE edema.  Neuro: Alert and oriented X 3. No focal deficits noted. Psych:  Good affect, responds appropriately Skin: No rashes or lesions noted.  Labs:   Lab Results  Component Value Date   WBC 8.6 05/11/2016   HGB 12.6 05/11/2016   HCT 37.3 05/11/2016   MCV 94.9 05/11/2016   PLT 255 05/11/2016   No results for input(s): INR in the last 72 hours.  Recent Labs Lab 05/10/16 0322 05/11/16 0542  NA 140 140  K 3.8 4.3  CL 106 109  CO2 26 24  BUN 16 12  CREATININE 0.91 0.82  CALCIUM 9.1 8.8*  PROT 6.2*  --   BILITOT 0.6  --   ALKPHOS 55  --   ALT  12*  --   AST 18  --   GLUCOSE 97 115*  ALBUMIN 3.5  --    Magnesium  Date Value Ref Range Status  05/10/2016 1.7 1.7 - 2.4 mg/dL Final    Recent Labs  16/10/96 0322 05/10/16 1056 05/10/16 1548  TROPONINI <0.03 <0.03 <0.03   No results for input(s): TROPIPOC in the last 72 hours. No results found for: PROBNP Lab Results  Component Value Date   CHOL 205 (H) 05/11/2016   HDL 33 (L) 05/11/2016   LDLCALC 143 (H) 05/11/2016   TRIG 146 05/11/2016   No results found for: DDIMER No results found for: LIPASE, AMYLASE  TSH  Date/Time Value Ref Range Status  05/10/2016 10:56 AM 1.650 0.350 - 4.500 uIU/mL Final    Comment:    Performed by a 3rd Generation assay with a functional sensitivity of <=0.01 uIU/mL.   No results found for: VITAMINB12, FOLATE, FERRITIN, TIBC, IRON, RETICCTPCT  Echo: pending  ECG:  Sinus bradycardia HR 43  Radiology:  Ct Head Wo Contrast  Result Date: 05/10/2016 CLINICAL DATA:  83 year old female with dizziness EXAM: CT HEAD WITHOUT CONTRAST TECHNIQUE: Contiguous axial images were obtained from the base of the skull through the vertex without intravenous contrast. COMPARISON:  None. FINDINGS: Brain: The ventricles and sulci are appropriate in size for patient's age. Mild periventricular and deep white matter chronic microvascular ischemic changes noted. There is no acute intracranial hemorrhage. No mass effect or midline shift noted. Vascular: No hyperdense vessel or unexpected calcification. Skull: Normal. Negative for fracture or focal lesion. Sinuses/Orbits: There is partial opacification of multiple right mastoid air cells. The left mastoid air cells and paranasal sinuses are clear. Other: None IMPRESSION: No acute intracranial hemorrhage. Mild age-related atrophy and chronic microvascular ischemic disease. If symptoms persist and there are no contraindications, MRI may provide better evaluation if clinically indicated Electronically Signed   By: Elgie Collard M.D.   On: 05/10/2016 06:12   Mr Brain Wo Contrast  Result Date: 05/10/2016 CLINICAL DATA:  Dizziness EXAM: MRI HEAD WITHOUT CONTRAST MRA HEAD WITHOUT CONTRAST TECHNIQUE: Multiplanar, multiecho pulse sequences of the brain and surrounding structures were obtained without intravenous contrast. Angiographic images of the head were obtained using MRA technique without contrast. COMPARISON:  CT head 05/10/2016 FINDINGS: MRI HEAD FINDINGS Brain: Generalized mild atrophy. Negative for hydrocephalus. Negative for acute infarct. Chronic microvascular ischemic change in the white matter and pons. The patient was not able to complete the study and several sequences were omitted. Vascular: Normal flow void Skull and upper cervical spine: Negative Sinuses/Orbits: Right mastoid sinus effusion. No obstructing nasopharyngeal mass. Mild mucosal edema paranasal sinuses. No orbital lesion. Other: None MRA HEAD FINDINGS Both vertebral arteries widely patent to the basilar. PICA patent bilaterally. Basilar widely patent. Superior cerebellar and posterior cerebral arteries appear normal bilaterally Cavernous carotid is normal bilaterally. Anterior and middle cerebral arteries are normal bilaterally. No stenosis or large vessel occlusion Negative for cerebral aneurysm. IMPRESSION: Atrophy and chronic microvascular ischemic change. No acute intracranial abnormality. Note the patient was not able to complete this MRI of the brain Negative MRA head Electronically Signed   By: Marlan Palau M.D.   On: 05/10/2016 19:25   Dg Chest Port 1 View  Result Date: 05/10/2016 CLINICAL DATA:  Diffuse chest pain EXAM: PORTABLE CHEST 1 VIEW COMPARISON:  01/14/2016 FINDINGS: Normal heart size and mediastinal contours. No acute infiltrate or edema. No effusion or pneumothorax. Bulky spondylosis. Osteopenia. No acute osseous findings. IMPRESSION: No active disease. Electronically Signed   By: Marnee Spring M.D.   On: 05/10/2016 07:45     Mr Maxine Glenn Headm  Result Date: 05/10/2016 CLINICAL DATA:  Dizziness EXAM: MRI HEAD WITHOUT CONTRAST MRA HEAD WITHOUT CONTRAST TECHNIQUE: Multiplanar, multiecho pulse sequences of the brain and surrounding structures were obtained without intravenous contrast. Angiographic images of the head were obtained using MRA technique without contrast. COMPARISON:  CT head 05/10/2016 FINDINGS: MRI HEAD FINDINGS Brain: Generalized mild atrophy. Negative for hydrocephalus. Negative for acute infarct. Chronic microvascular ischemic change in the white matter and pons. The patient was not able to complete the study and several sequences were omitted. Vascular: Normal  flow void Skull and upper cervical spine: Negative Sinuses/Orbits: Right mastoid sinus effusion. No obstructing nasopharyngeal mass. Mild mucosal edema paranasal sinuses. No orbital lesion. Other: None MRA HEAD FINDINGS Both vertebral arteries widely patent to the basilar. PICA patent bilaterally. Basilar widely patent. Superior cerebellar and posterior cerebral arteries appear normal bilaterally Cavernous carotid is normal bilaterally. Anterior and middle cerebral arteries are normal bilaterally. No stenosis or large vessel occlusion Negative for cerebral aneurysm. IMPRESSION: Atrophy and chronic microvascular ischemic change. No acute intracranial abnormality. Note the patient was not able to complete this MRI of the brain Negative MRA head Electronically Signed   By: Marlan Palau M.D.   On: 05/10/2016 19:25    ASSESSMENT AND PLAN:    Principal Problem:   Adjustment disorder with other symptoms Active Problems:   Chest pain, rule out acute myocardial infarction   Dizziness   Bradycardia   Subacute confusional state  Sinus bradycardia: seems asymptomatic. She hasn't been taking any medications. Her med list lists Aricept which she has not taken in years. She is not on any other AV nodal blocking agents. HR has been down to 43 on ECG but currently in  50s. She is now feeling quite well, but a difficult historian. Dr. Ladona Ridgel to see.   Chest pain: troponin has been negative. ECG with no ischemic changes. She does not report chest pain on my interview. No further work up.   HTN: BP well controlled on no medications  HLD: LDL 143.   Signed: Cline Crock, PA-C 05/11/2016 11:56 AM  Pager [phone]  EP Attending  Patient seen and examined. Agree with above. The patient has sinus bradycardia though it is unclear to me as to whether she is symptomatic or not. Her exam is as noted above by Deborha Payment, PA-C. I have recommended she DC her aricept as it is known to cause bradycardia. Would hope to avoid a PPM in this patient with multiple other comorbidities unless she has clear symptoms associated with HR's below 45.  Leonia Reeves.D.

## 2016-05-11 NOTE — [place] FL2 (Signed)
[place] MEDICAID FL2 LEVEL OF CARE SCREENING TOOL     IDENTIFICATION  Patient Name: Cassidy [name] Birthdate: 11/06/1933 Sex: female Admission Date (Current Location): 05/10/2016  [facility] and [place] Number:  Producer, television/film/video and Address:  [facility], [address], [place], [place] [address]      Provider Number: [id number]  Attending Physician Name and Address:  Richarda Overlie, MD  Relative Name and Phone Number:       Current Level of Care: Hospital Recommended Level of Care: Assisted Living Facility Prior Approval Number:    Date Approved/Denied:   PASRR Number:    Discharge Plan:  (ALF)    Current Diagnoses: Patient Active Problem List   Diagnosis Date Noted  . Chest pain, rule out acute myocardial infarction 05/10/2016  . Dizziness 05/10/2016  . Bradycardia 05/10/2016  . Subacute confusional state 05/10/2016  . Adjustment disorder with other symptoms 05/10/2016    Orientation RESPIRATION BLADDER Height & Weight     Self, Time, Situation, Place  Normal Continent Weight: 131 lb (59.4 kg) Height:  5\' 3"  (160 cm)  BEHAVIORAL SYMPTOMS/MOOD NEUROLOGICAL BOWEL NUTRITION STATUS      Continent Diet (Heart Healthy; thin liquids)  AMBULATORY STATUS COMMUNICATION OF NEEDS Skin   Limited Assist Verbally Normal                       Personal Care Assistance Level of Assistance  Bathing, Feeding, Dressing Bathing Assistance: Limited assistance Feeding assistance: Independent Dressing Assistance: Limited assistance     Functional Limitations Info  Sight, Hearing, Speech Sight Info: Adequate Hearing Info: Adequate Speech Info: Adequate    SPECIAL CARE FACTORS FREQUENCY                       Contractures Contractures Info: Not present    Additional Factors Info  Code Status, Allergies Code Status Info: Full Allergies Info: Ciprofloxacin Hcl, Prednisone, Zantac Ranitidine           Current Medications  (05/11/2016):  This is the current hospital active medication list Current Facility-Administered Medications  Medication Dose Route Frequency Provider Last Rate Last Dose  .  stroke: mapping our early stages of recovery book   Does not apply Once Ozella Rocks, MD      . 0.45 % sodium chloride infusion   Intravenous Continuous Ozella Rocks, MD 75 mL/hr at 05/11/16 0236 75 mL/hr at 05/11/16 0236  . acetaminophen (TYLENOL) tablet 650 mg  650 mg Oral Q6H PRN Ozella Rocks, MD       Or  . acetaminophen (TYLENOL) suppository 650 mg  650 mg Rectal Q6H PRN Ozella Rocks, MD      . aspirin suppository 300 mg  300 mg Rectal Daily Ozella Rocks, MD       Or  . aspirin tablet 325 mg  325 mg Oral Daily Ozella Rocks, MD   325 mg at 05/11/16 1478  . enoxaparin (LOVENOX) injection 40 mg  40 mg Subcutaneous Q24H Ozella Rocks, MD   40 mg at 05/11/16 2956  . gi cocktail (Maalox,Lidocaine,Donnatal)  30 mL Oral QID PRN Ozella Rocks, MD      . ketorolac (TORADOL) 15 MG/ML injection 15 mg  15 mg Intravenous Q6H PRN Ozella Rocks, MD      . meclizine (ANTIVERT) tablet 25 mg  25 mg Oral TID PRN Ozella Rocks, MD      .  ondansetron (ZOFRAN) tablet 4 mg  4 mg Oral Q6H PRN Ozella Rocksavid J Merrell, MD       Or  . ondansetron [facility](ZOFRAN) injection 4 mg  4 mg Intravenous Q6H PRN Ozella Rocksavid J Merrell, MD      . oxyCODONE (Oxy IR/ROXICODONE) immediate release tablet 5 mg  5 mg Oral Q4H PRN Ozella Rocksavid J Merrell, MD      . sodium chloride flush (NS) 0.9 % injection 3 mL  3 mL Intravenous Q12H Ozella Rocksavid J Merrell, MD   3 mL at 05/10/16 2200  . traZODone (DESYREL) tablet 25 mg  25 mg Oral QHS PRN Ozella Rocksavid J Merrell, MD         Discharge Medications: Please see discharge summary for a list of discharge medications.  Relevant Imaging Results:  Relevant Lab Results:   Additional Information SSN: [id number]  Jamey ReasBridget [place], [place] [phone]

## 2016-05-11 NOTE — Progress Notes (Signed)
[facility] PROGRESS NOTE  Cassidy [name] ZOX:[id number] DOB: [birth year] DOA: 05/10/2016   PCP: No primary care provider on file.     Assessment/Plan: Principal Problem:   Adjustment disorder with other symptoms Active Problems:   Chest pain, rule out acute myocardial infarction   Dizziness   Bradycardia   Subacute confusional state    82 y.o. female with medical history significant of arthritis and HTN  presenting with multiple vague complaints. Level V caveat applies as patient is very tangential in her thought process and unable to give specific details about her symptoms and even changes her story when asked the same question at different times. Of note patient states that she is currently living in a shelter and has been there for years, but also states that she is homeless. She has no primary care physician. Hospital notes state the patient arrived via EMS because no further details about where EMS picked the patient up from. Patient states that she has had approximately 2-3 days of dizziness. Difficult to tell if this is constant with waxing and waning nature or intermittent. Worse with ambulation and sitting. Improves with rest. Patient states that she is still currently symptomatically even while resting in bed and not moving. Associated with nausea and generalized weakness  Assessment/Plan   Dizziness: unclear etiology though stroke remains high on differential. EtOH and UDS negative, CT head without evidence of acute process, UA normal, CBC with differential unremarkable. Neurological exam fairly benign. Patient continues to describe times a history of constant dizziness for the last 2-3 days with waxing and waning nature and continues to endorse mild dizziness while resting in bed.  - Telemetry, observation - MRI brain, MRA head and neck No acute intracranial abnormality. Unable to complete study - Echo pending  - Orthostatics positive , on admission,  - IVF,  Zofran, meclizine - Neuro consult if MRI + or remains symptomatic.   Chest pain:   Initial troponin negative, EKG without signs of ACS showing sinus bradycardia - Telemetry, EKG, -BRADYCARDIAC last night into the 30's    Troponin negative  - GI cocktail - If returns consider CT chest UDS negative Cardiology following , may need EP for bradycardia  TSH nl  Social/Psych: Patient with no identified psychiatric diagnoses. Patient brought up several occasions where people in the community accused her of being crazy over the course of her admission exam. Patient also states that she had her house taken away from her and has been living in a homeless shelter for the last 3 years. Patient is unsure of where it is that she stays or of her emergency or other family contacts.  - Social work consult for persistent with placement of patient after discharge - Psych consult: Please assist with evaluating patient and possibly riding in a psychiatric diagnoses or at least ruling out     DVT prophylaxsis lovenox   Code Status:  Full code    Family Communication: Discussed in detail with the patient, all imaging results, lab results explained to the patient   Disposition Plan: snf       Consultants:  Cards   Procedures:  None   Antibiotics: Anti-infectives    None         HPI/Subjective: Says she will not speak to me , she is busy eating ,   Objective: Vitals:   05/11/16 0456 05/11/16 0459 05/11/16 0850 05/11/16 1223  BP: (!) 118/46  131/68 102/88  Pulse:   Marland Kitchen)  56 (!) 58  Resp:   18 20  Temp:  98.3 F (36.8 C) 97.5 F (36.4 C) 97.8 F (36.6 C)  TempSrc:  Oral Oral Oral  SpO2:   98% 100%  Weight:  59.4 kg (131 lb)    Height:        Intake/Output Summary (Last 24 hours) at 05/11/16 1256 Last data filed at 05/11/16 0915  Gross per 24 hour  Intake              343 ml  Output                0 ml  Net              343 ml    Exam:  Examination:  General exam:  Appears calm and comfortable  Respiratory system: Clear to auscultation. Respiratory effort normal. Cardiovascular system: S1 & S2 heard, RRR. No JVD, murmurs, rubs, gallops or clicks. No pedal edema. Gastrointestinal system: Abdomen is nondistended, soft and nontender. No organomegaly or masses felt. Normal bowel sounds heard. Central nervous system: Alert and oriented. No focal neurological deficits. Extremities: Symmetric 5 x 5 power. Skin: No rashes, lesions or ulcers Psychiatry: Judgement and insight appear normal. Mood & affect appropriate.     Data Reviewed: I have personally reviewed following labs and imaging studies  Micro Results No results found for this or any previous visit (from the past 240 hour(s)).  Radiology Reports Ct Head Wo Contrast  Result Date: 05/10/2016 CLINICAL DATA:  82 year old female with dizziness EXAM: CT HEAD WITHOUT CONTRAST TECHNIQUE: Contiguous axial images were obtained from the base of the skull through the vertex without intravenous contrast. COMPARISON:  None. FINDINGS: Brain: The ventricles and sulci are appropriate in size for patient's age. Mild periventricular and deep white matter chronic microvascular ischemic changes noted. There is no acute intracranial hemorrhage. No mass effect or midline shift noted. Vascular: No hyperdense vessel or unexpected calcification. Skull: Normal. Negative for fracture or focal lesion. Sinuses/Orbits: There is partial opacification of multiple right mastoid air cells. The left mastoid air cells and paranasal sinuses are clear. Other: None IMPRESSION: No acute intracranial hemorrhage. Mild age-related atrophy and chronic microvascular ischemic disease. If symptoms persist and there are no contraindications, MRI may provide better evaluation if clinically indicated Electronically Signed   By: Elgie CollardArash  Radparvar M.D.   On: 05/10/2016 06:12   Mr Brain Wo Contrast  Result Date: 05/10/2016 CLINICAL DATA:  Dizziness EXAM:  MRI HEAD WITHOUT CONTRAST MRA HEAD WITHOUT CONTRAST TECHNIQUE: Multiplanar, multiecho pulse sequences of the brain and surrounding structures were obtained without intravenous contrast. Angiographic images of the head were obtained using MRA technique without contrast. COMPARISON:  CT head 05/10/2016 FINDINGS: MRI HEAD FINDINGS Brain: Generalized mild atrophy. Negative for hydrocephalus. Negative for acute infarct. Chronic microvascular ischemic change in the white matter and pons. The patient was not able to complete the study and several sequences were omitted. Vascular: Normal flow void Skull and upper cervical spine: Negative Sinuses/Orbits: Right mastoid sinus effusion. No obstructing nasopharyngeal mass. Mild mucosal edema paranasal sinuses. No orbital lesion. Other: None MRA HEAD FINDINGS Both vertebral arteries widely patent to the basilar. PICA patent bilaterally. Basilar widely patent. Superior cerebellar and posterior cerebral arteries appear normal bilaterally Cavernous carotid is normal bilaterally. Anterior and middle cerebral arteries are normal bilaterally. No stenosis or large vessel occlusion Negative for cerebral aneurysm. IMPRESSION: Atrophy and chronic microvascular ischemic change. No acute intracranial abnormality. Note the patient was not able  to complete this MRI of the brain Negative MRA head Electronically Signed   By: Marlan Palau M.D.   On: 05/10/2016 19:25   Dg Chest Port 1 View  Result Date: 05/10/2016 CLINICAL DATA:  Diffuse chest pain EXAM: PORTABLE CHEST 1 VIEW COMPARISON:  01/14/2016 FINDINGS: Normal heart size and mediastinal contours. No acute infiltrate or edema. No effusion or pneumothorax. Bulky spondylosis. Osteopenia. No acute osseous findings. IMPRESSION: No active disease. Electronically Signed   By: Marnee Spring M.D.   On: 05/10/2016 07:45   Mr Maxine Glenn Headm  Result Date: 05/10/2016 CLINICAL DATA:  Dizziness EXAM: MRI HEAD WITHOUT CONTRAST MRA HEAD WITHOUT  CONTRAST TECHNIQUE: Multiplanar, multiecho pulse sequences of the brain and surrounding structures were obtained without intravenous contrast. Angiographic images of the head were obtained using MRA technique without contrast. COMPARISON:  CT head 05/10/2016 FINDINGS: MRI HEAD FINDINGS Brain: Generalized mild atrophy. Negative for hydrocephalus. Negative for acute infarct. Chronic microvascular ischemic change in the white matter and pons. The patient was not able to complete the study and several sequences were omitted. Vascular: Normal flow void Skull and upper cervical spine: Negative Sinuses/Orbits: Right mastoid sinus effusion. No obstructing nasopharyngeal mass. Mild mucosal edema paranasal sinuses. No orbital lesion. Other: None MRA HEAD FINDINGS Both vertebral arteries widely patent to the basilar. PICA patent bilaterally. Basilar widely patent. Superior cerebellar and posterior cerebral arteries appear normal bilaterally Cavernous carotid is normal bilaterally. Anterior and middle cerebral arteries are normal bilaterally. No stenosis or large vessel occlusion Negative for cerebral aneurysm. IMPRESSION: Atrophy and chronic microvascular ischemic change. No acute intracranial abnormality. Note the patient was not able to complete this MRI of the brain Negative MRA head Electronically Signed   By: Marlan Palau M.D.   On: 05/10/2016 19:25     CBC  Recent Labs Lab 05/10/16 0322 05/11/16 0542  WBC 11.5* 8.6  HGB 13.1 12.6  HCT 38.8 37.3  PLT 315 255  MCV 92.4 94.9  MCH 31.2 32.1  MCHC 33.8 33.8  RDW 12.4 12.5  LYMPHSABS 2.8  --   MONOABS 0.8  --   EOSABS 0.3  --   BASOSABS 0.0  --     Chemistries   Recent Labs Lab 05/10/16 0322 05/10/16 1056 05/11/16 0542  NA 140  --  140  K 3.8  --  4.3  CL 106  --  109  CO2 26  --  24  GLUCOSE 97  --  115*  BUN 16  --  12  CREATININE 0.91  --  0.82  CALCIUM 9.1  --  8.8*  MG  --  1.7  --   AST 18  --   --   ALT 12*  --   --   ALKPHOS  55  --   --   BILITOT 0.6  --   --    ------------------------------------------------------------------------------------------------------------------ estimated creatinine clearance is 45.3 mL/min (by C-G formula based on SCr of 0.82 mg/dL). ------------------------------------------------------------------------------------------------------------------  Recent Labs  05/10/16 1056  HGBA1C 5.4   ------------------------------------------------------------------------------------------------------------------  Recent Labs  05/11/16 0542  CHOL 205*  HDL 33*  LDLCALC 143*  TRIG 146  CHOLHDL 6.2   ------------------------------------------------------------------------------------------------------------------  Recent Labs  05/10/16 1056  TSH 1.650   ------------------------------------------------------------------------------------------------------------------ No results for input(s): VITAMINB12, FOLATE, FERRITIN, TIBC, IRON, RETICCTPCT in the last 72 hours.  Coagulation profile No results for input(s): INR, PROTIME in the last 168 hours.  No results for input(s): DDIMER in the last 72 hours.  Cardiac Enzymes  Recent Labs Lab 05/10/16 0322 05/10/16 1056 05/10/16 1548  TROPONINI <0.03 <0.03 <0.03   ------------------------------------------------------------------------------------------------------------------ Invalid input(s): POCBNP   CBG: No results for input(s): GLUCAP in the last 168 hours.     Studies: Ct Head Wo Contrast  Result Date: 05/10/2016 CLINICAL DATA:  82 year old female with dizziness EXAM: CT HEAD WITHOUT CONTRAST TECHNIQUE: Contiguous axial images were obtained from the base of the skull through the vertex without intravenous contrast. COMPARISON:  None. FINDINGS: Brain: The ventricles and sulci are appropriate in size for patient's age. Mild periventricular and deep white matter chronic microvascular ischemic changes noted. There is no  acute intracranial hemorrhage. No mass effect or midline shift noted. Vascular: No hyperdense vessel or unexpected calcification. Skull: Normal. Negative for fracture or focal lesion. Sinuses/Orbits: There is partial opacification of multiple right mastoid air cells. The left mastoid air cells and paranasal sinuses are clear. Other: None IMPRESSION: No acute intracranial hemorrhage. Mild age-related atrophy and chronic microvascular ischemic disease. If symptoms persist and there are no contraindications, MRI may provide better evaluation if clinically indicated Electronically Signed   By: Elgie Collard M.D.   On: 05/10/2016 06:12   Mr Brain Wo Contrast  Result Date: 05/10/2016 CLINICAL DATA:  Dizziness EXAM: MRI HEAD WITHOUT CONTRAST MRA HEAD WITHOUT CONTRAST TECHNIQUE: Multiplanar, multiecho pulse sequences of the brain and surrounding structures were obtained without intravenous contrast. Angiographic images of the head were obtained using MRA technique without contrast. COMPARISON:  CT head 05/10/2016 FINDINGS: MRI HEAD FINDINGS Brain: Generalized mild atrophy. Negative for hydrocephalus. Negative for acute infarct. Chronic microvascular ischemic change in the white matter and pons. The patient was not able to complete the study and several sequences were omitted. Vascular: Normal flow void Skull and upper cervical spine: Negative Sinuses/Orbits: Right mastoid sinus effusion. No obstructing nasopharyngeal mass. Mild mucosal edema paranasal sinuses. No orbital lesion. Other: None MRA HEAD FINDINGS Both vertebral arteries widely patent to the basilar. PICA patent bilaterally. Basilar widely patent. Superior cerebellar and posterior cerebral arteries appear normal bilaterally Cavernous carotid is normal bilaterally. Anterior and middle cerebral arteries are normal bilaterally. No stenosis or large vessel occlusion Negative for cerebral aneurysm. IMPRESSION: Atrophy and chronic microvascular ischemic change.  No acute intracranial abnormality. Note the patient was not able to complete this MRI of the brain Negative MRA head Electronically Signed   By: Marlan Palau M.D.   On: 05/10/2016 19:25   Dg Chest Port 1 View  Result Date: 05/10/2016 CLINICAL DATA:  Diffuse chest pain EXAM: PORTABLE CHEST 1 VIEW COMPARISON:  01/14/2016 FINDINGS: Normal heart size and mediastinal contours. No acute infiltrate or edema. No effusion or pneumothorax. Bulky spondylosis. Osteopenia. No acute osseous findings. IMPRESSION: No active disease. Electronically Signed   By: Marnee Spring M.D.   On: 05/10/2016 07:45   Mr Maxine Glenn Headm  Result Date: 05/10/2016 CLINICAL DATA:  Dizziness EXAM: MRI HEAD WITHOUT CONTRAST MRA HEAD WITHOUT CONTRAST TECHNIQUE: Multiplanar, multiecho pulse sequences of the brain and surrounding structures were obtained without intravenous contrast. Angiographic images of the head were obtained using MRA technique without contrast. COMPARISON:  CT head 05/10/2016 FINDINGS: MRI HEAD FINDINGS Brain: Generalized mild atrophy. Negative for hydrocephalus. Negative for acute infarct. Chronic microvascular ischemic change in the white matter and pons. The patient was not able to complete the study and several sequences were omitted. Vascular: Normal flow void Skull and upper cervical spine: Negative Sinuses/Orbits: Right mastoid sinus effusion. No obstructing nasopharyngeal mass. Mild mucosal edema paranasal sinuses. No orbital lesion.  Other: None MRA HEAD FINDINGS Both vertebral arteries widely patent to the basilar. PICA patent bilaterally. Basilar widely patent. Superior cerebellar and posterior cerebral arteries appear normal bilaterally Cavernous carotid is normal bilaterally. Anterior and middle cerebral arteries are normal bilaterally. No stenosis or large vessel occlusion Negative for cerebral aneurysm. IMPRESSION: Atrophy and chronic microvascular ischemic change. No acute intracranial abnormality. Note the  patient was not able to complete this MRI of the brain Negative MRA head Electronically Signed   By: Marlan Palau M.D.   On: 05/10/2016 19:25      Lab Results  Component Value Date   HGBA1C 5.4 05/10/2016   Lab Results  Component Value Date   LDLCALC 143 (H) 05/11/2016   CREATININE 0.82 05/11/2016       Scheduled Meds: .  stroke: mapping our early stages of recovery book   Does not apply Once  . aspirin  300 mg Rectal Daily   Or  . aspirin  325 mg Oral Daily  . enoxaparin (LOVENOX) injection  40 mg Subcutaneous Q24H  . sodium chloride flush  3 mL Intravenous Q12H   Continuous Infusions: . sodium chloride 75 mL/hr (05/11/16 0236)     LOS: 0 days    Time spent: >30 MINS    [facility]  [facility] Pager [phone]. If 7PM-7AM, please contact night-coverage at [email or web address], password [facility] 05/11/2016, 12:56 PM  LOS: 0 days

## 2016-05-12 ENCOUNTER — Observation Stay (HOSPITAL_BASED_OUTPATIENT_CLINIC_OR_DEPARTMENT_OTHER): Payer: Medicare HMO

## 2016-05-12 ENCOUNTER — Observation Stay (HOSPITAL_COMMUNITY): Payer: Medicare HMO

## 2016-05-12 DIAGNOSIS — R42 Dizziness and giddiness: Secondary | ICD-10-CM | POA: Diagnosis not present

## 2016-05-12 DIAGNOSIS — F05 Delirium due to known physiological condition: Secondary | ICD-10-CM | POA: Diagnosis not present

## 2016-05-12 DIAGNOSIS — R079 Chest pain, unspecified: Secondary | ICD-10-CM | POA: Diagnosis not present

## 2016-05-12 DIAGNOSIS — I495 Sick sinus syndrome: Secondary | ICD-10-CM | POA: Diagnosis not present

## 2016-05-12 DIAGNOSIS — I200 Unstable angina: Secondary | ICD-10-CM

## 2016-05-12 DIAGNOSIS — F4329 Adjustment disorder with other symptoms: Secondary | ICD-10-CM | POA: Diagnosis not present

## 2016-05-12 LAB — NM MYOCAR MULTI W/SPECT W/WALL MOTION / EF
CHL CUP RESTING HR STRESS: 55 {beats}/min
CSEPHR: 60 %
Estimated workload: 1 METS
Exercise duration (min): 5 min
MPHR: 140 {beats}/min
Peak HR: 66 {beats}/min

## 2016-05-12 MED ORDER — TECHNETIUM TC 99M TETROFOSMIN IV KIT
10.0000 | PACK | Freq: Once | INTRAVENOUS | Status: AC | PRN
  Administered 2016-05-12: 10 via INTRAVENOUS

## 2016-05-12 MED ORDER — REGADENOSON 0.4 MG/5ML IV SOLN
0.4000 mg | Freq: Once | INTRAVENOUS | Status: AC
  Administered 2016-05-12: 0.4 mg via INTRAVENOUS
  Filled 2016-05-12: qty 5

## 2016-05-12 MED ORDER — TRAZODONE HCL 50 MG PO TABS: 25.0000 mg | ORAL_TABLET | Freq: Every evening | ORAL | 0 refills | Status: DC | PRN

## 2016-05-12 MED ORDER — MECLIZINE HCL 25 MG PO TABS: 25.0000 mg | ORAL_TABLET | Freq: Three times a day (TID) | ORAL | 0 refills | Status: DC | PRN

## 2016-05-12 MED ORDER — REGADENOSON 0.4 MG/5ML IV SOLN
INTRAVENOUS | Status: AC
  Filled 2016-05-12: qty 5

## 2016-05-12 MED ORDER — TECHNETIUM TC 99M TETROFOSMIN IV KIT
30.0000 | PACK | Freq: Once | INTRAVENOUS | Status: AC | PRN
  Administered 2016-05-12: 30 via INTRAVENOUS

## 2016-05-12 NOTE — Discharge Summary (Signed)
Physician Discharge Summary  Cassidy [name] MRN: [id number] DOB/AGE: [birth year] 82 y.o.  PCP: No primary care provider on file.   Admit date: 05/10/2016 Discharge date: 05/12/2016  Discharge Diagnoses:    Principal Problem:   Adjustment disorder with other symptoms Active Problems:   Chest pain, rule out acute myocardial infarction   Dizziness   Bradycardia   Subacute confusional state    Follow-up recommendations Follow-up with PCP in 3-5 days , including all  additional recommended appointments as below Follow-up CBC, CMP in 3-5 days       Current Discharge Medication List    START taking these medications   Details  meclizine (ANTIVERT) 25 MG tablet Take 1 tablet (25 mg total) by mouth 3 (three) times daily as needed for dizziness. Qty: 30 tablet, Refills: 0    traZODone (DESYREL) 50 MG tablet Take 0.5 tablets (25 mg total) by mouth at bedtime as needed for sleep. Qty: 30 tablet, Refills: 0      CONTINUE these medications which have NOT CHANGED   Details  lactobacillus acidophilus (BACID) TABS tablet Take 1 tablet by mouth daily. For 30 days    meloxicam (MOBIC) 15 MG tablet Take 15 mg by mouth daily.    Multiple Vitamin (MULTIVITAMIN WITH MINERALS) TABS tablet Take 1 tablet by mouth daily.    sertraline (ZOLOFT) 25 MG tablet Take 25 mg by mouth at bedtime.      STOP taking these medications     donepezil (ARICEPT) 5 MG tablet      triamterene-hydrochlorothiazide (MAXZIDE-25) 37.5-25 MG tablet          Discharge Condition: stable   Discharge Instructions Get Medicines reviewed and adjusted: Please take all your medications with you for your next visit with your Primary MD  Please request your Primary MD to go over all hospital tests and procedure/radiological results at the follow up, please ask your Primary MD to get all Hospital records sent to his/her office.  If you experience worsening of your admission symptoms, develop shortness of  breath, life threatening emergency, suicidal or homicidal thoughts you must seek medical attention immediately by calling 911 or calling your MD immediately if symptoms less severe.  You must read complete instructions/literature along with all the possible adverse reactions/side effects for all the Medicines you take and that have been prescribed to you. Take any new Medicines after you have completely understood and accpet all the possible adverse reactions/side effects.   Do not drive when taking Pain medications.   Do not take more than prescribed Pain, Sleep and Anxiety Medications  Special Instructions: If you have smoked or chewed Tobacco in the last 2 yrs please stop smoking, stop any regular Alcohol and or any Recreational drug use.  Wear Seat belts while driving.  Please note  You were cared for by a hospitalist during your hospital stay. Once you are discharged, your primary care physician will handle any further medical issues. Please note that NO REFILLS for any discharge medications will be authorized once you are discharged, as it is imperative that you return to your primary care physician (or establish a relationship with a primary care physician if you do not have one) for your aftercare needs so that they can reassess your need for medications and monitor your lab values.  Discharge Instructions    Diet - low sodium heart healthy    Complete by:  As directed    Increase activity slowly    Complete by:  As  directed        Allergies  Allergen Reactions  . Ciprofloxacin Hcl Other (See Comments)    Reaction: unknown   . Prednisone Other (See Comments)    Reaction: unknown   . Zantac [Ranitidine] Other (See Comments)    Reaction: unknown  "too strong"       Disposition: ALF   Consults:  cardiology   Significant Diagnostic Studies:  Ct Head Wo Contrast  Result Date: 05/10/2016 CLINICAL DATA:  82 year old female with dizziness EXAM: CT HEAD WITHOUT  CONTRAST TECHNIQUE: Contiguous axial images were obtained from the base of the skull through the vertex without intravenous contrast. COMPARISON:  None. FINDINGS: Brain: The ventricles and sulci are appropriate in size for patient's age. Mild periventricular and deep white matter chronic microvascular ischemic changes noted. There is no acute intracranial hemorrhage. No mass effect or midline shift noted. Vascular: No hyperdense vessel or unexpected calcification. Skull: Normal. Negative for fracture or focal lesion. Sinuses/Orbits: There is partial opacification of multiple right mastoid air cells. The left mastoid air cells and paranasal sinuses are clear. Other: None IMPRESSION: No acute intracranial hemorrhage. Mild age-related atrophy and chronic microvascular ischemic disease. If symptoms persist and there are no contraindications, MRI may provide better evaluation if clinically indicated Electronically Signed   By: Anner Crete M.D.   On: 05/10/2016 06:12   Mr Brain Wo Contrast  Result Date: 05/10/2016 CLINICAL DATA:  Dizziness EXAM: MRI HEAD WITHOUT CONTRAST MRA HEAD WITHOUT CONTRAST TECHNIQUE: Multiplanar, multiecho pulse sequences of the brain and surrounding structures were obtained without intravenous contrast. Angiographic images of the head were obtained using MRA technique without contrast. COMPARISON:  CT head 05/10/2016 FINDINGS: MRI HEAD FINDINGS Brain: Generalized mild atrophy. Negative for hydrocephalus. Negative for acute infarct. Chronic microvascular ischemic change in the white matter and pons. The patient was not able to complete the study and several sequences were omitted. Vascular: Normal flow void Skull and upper cervical spine: Negative Sinuses/Orbits: Right mastoid sinus effusion. No obstructing nasopharyngeal mass. Mild mucosal edema paranasal sinuses. No orbital lesion. Other: None MRA HEAD FINDINGS Both vertebral arteries widely patent to the basilar. PICA patent  bilaterally. Basilar widely patent. Superior cerebellar and posterior cerebral arteries appear normal bilaterally Cavernous carotid is normal bilaterally. Anterior and middle cerebral arteries are normal bilaterally. No stenosis or large vessel occlusion Negative for cerebral aneurysm. IMPRESSION: Atrophy and chronic microvascular ischemic change. No acute intracranial abnormality. Note the patient was not able to complete this MRI of the brain Negative MRA head Electronically Signed   By: Franchot Gallo M.D.   On: 05/10/2016 19:25   Nm Myocar Multi W/spect W/wall Motion / Ef  Result Date: 05/12/2016 CLINICAL DATA:  Chest pain EXAM: MYOCARDIAL IMAGING WITH SPECT (REST AND PHARMACOLOGIC-STRESS) GATED LEFT VENTRICULAR WALL MOTION STUDY LEFT VENTRICULAR EJECTION FRACTION TECHNIQUE: Standard myocardial SPECT imaging was performed after resting intravenous injection of 10 mCi Tc-59mtetrofosmin. Subsequently, intravenous infusion of Lexiscan was performed under the supervision of the Cardiology staff. At peak effect of the drug, 30 mCi Tc-94metrofosmin was injected intravenously and standard myocardial SPECT imaging was performed. Quantitative gated imaging was also performed to evaluate left ventricular wall motion, and estimate left ventricular ejection fraction. COMPARISON:  None available FINDINGS: Perfusion: Mild fixed defect of the apex extending into the septum, compatible with remote infarct or scar. No decreased activity in the left ventricle on stress imaging to suggest reversible ischemia or infarction. Wall Motion: Mild septal hypokinesis otherwise normal left ventricular wall motion.  No LV chamber dilatation. Left Ventricular Ejection Fraction: 64 % End diastolic volume 58 ml End systolic volume 21 ml IMPRESSION: 1. Mild fixed apical septal defect, suspect remote infarction or scar. No significant inducible or reversible ischemia with pharmacologic stress. 2. Mild septal hypokinesis otherwise normal  wall motion. 3. Left ventricular ejection fraction 64% 4. Non invasive risk stratification*: Low *2012 Appropriate Use Criteria for Coronary Revascularization Focused Update: J Am Coll Cardiol. 2248;25(0):037-048. http://content.airportbarriers.com.aspx?articleid=[email or web address] Electronically Signed   By: Jerilynn Mages.  Shick M.D.   On: 05/12/2016 12:11   Dg Chest Port 1 View  Result Date: 05/10/2016 CLINICAL DATA:  Diffuse chest pain EXAM: PORTABLE CHEST 1 VIEW COMPARISON:  01/14/2016 FINDINGS: Normal heart size and mediastinal contours. No acute infiltrate or edema. No effusion or pneumothorax. Bulky spondylosis. Osteopenia. No acute osseous findings. IMPRESSION: No active disease. Electronically Signed   By: Monte Fantasia M.D.   On: 05/10/2016 07:45   Mr Jodene Nam Headm  Result Date: 05/10/2016 CLINICAL DATA:  Dizziness EXAM: MRI HEAD WITHOUT CONTRAST MRA HEAD WITHOUT CONTRAST TECHNIQUE: Multiplanar, multiecho pulse sequences of the brain and surrounding structures were obtained without intravenous contrast. Angiographic images of the head were obtained using MRA technique without contrast. COMPARISON:  CT head 05/10/2016 FINDINGS: MRI HEAD FINDINGS Brain: Generalized mild atrophy. Negative for hydrocephalus. Negative for acute infarct. Chronic microvascular ischemic change in the white matter and pons. The patient was not able to complete the study and several sequences were omitted. Vascular: Normal flow void Skull and upper cervical spine: Negative Sinuses/Orbits: Right mastoid sinus effusion. No obstructing nasopharyngeal mass. Mild mucosal edema paranasal sinuses. No orbital lesion. Other: None MRA HEAD FINDINGS Both vertebral arteries widely patent to the basilar. PICA patent bilaterally. Basilar widely patent. Superior cerebellar and posterior cerebral arteries appear normal bilaterally Cavernous carotid is normal bilaterally. Anterior and middle cerebral arteries are normal bilaterally. No stenosis or large vessel  occlusion Negative for cerebral aneurysm. IMPRESSION: Atrophy and chronic microvascular ischemic change. No acute intracranial abnormality. Note the patient was not able to complete this MRI of the brain Negative MRA head Electronically Signed   By: Franchot Gallo M.D.   On: 05/10/2016 19:25        Filed Weights   05/10/16 1022 05/11/16 0459 05/12/16 0430  Weight: 58.4 kg (128 lb 11.2 oz) 59.4 kg (131 lb) 60.6 kg (133 lb 9.6 oz)     Microbiology: No results found for this or any previous visit (from the past 240 hour(s)).     Blood Culture No results found for: SDES, SPECREQUEST, CULT, REPTSTATUS    Labs: Results for orders placed or performed during the hospital encounter of 05/10/16 (from the past 48 hour(s))  Troponin I     Status: None   Collection Time: 05/10/16  3:48 PM  Result Value Ref Range   Troponin I <0.03 <0.03 ng/mL  Lipid panel     Status: Abnormal   Collection Time: 05/11/16  5:42 AM  Result Value Ref Range   Cholesterol 205 (H) 0 - 200 mg/dL   Triglycerides 146 <150 mg/dL   HDL 33 (L) >40 mg/dL   Total CHOL/HDL Ratio 6.2 RATIO   VLDL 29 0 - 40 mg/dL   LDL Cholesterol 143 (H) 0 - 99 mg/dL    Comment:        Total Cholesterol/HDL:CHD Risk Coronary Heart Disease Risk Table                     Men  Women  1/2 Average Risk   3.4   3.3  Average Risk       5.0   4.4  2 X Average Risk   9.6   7.1  3 X Average Risk  23.4   11.0        Use the calculated Patient Ratio above and the CHD Risk Table to determine the patient's CHD Risk.        ATP III CLASSIFICATION (LDL):  <100     mg/dL   Optimal  100-129  mg/dL   Near or Above                    Optimal  130-159  mg/dL   Borderline  160-189  mg/dL   High  >190     mg/dL   Very High   CBC     Status: None   Collection Time: 05/11/16  5:42 AM  Result Value Ref Range   WBC 8.6 4.0 - 10.5 K/uL   RBC 3.93 3.87 - 5.11 MIL/uL   Hemoglobin 12.6 12.0 - 15.0 g/dL   HCT 37.3 36.0 - 46.0 %   MCV 94.9 78.0 -  100.0 fL   MCH 32.1 26.0 - 34.0 pg   MCHC 33.8 30.0 - 36.0 g/dL   RDW 12.5 11.5 - 15.5 %   Platelets 255 150 - 400 K/uL  Basic metabolic panel     Status: Abnormal   Collection Time: 05/11/16  5:42 AM  Result Value Ref Range   Sodium 140 135 - 145 mmol/L   Potassium 4.3 3.5 - 5.1 mmol/L   Chloride 109 101 - 111 mmol/L   CO2 24 22 - 32 mmol/L   Glucose, Bld 115 (H) 65 - 99 mg/dL   BUN 12 6 - 20 mg/dL   Creatinine, Ser 0.82 0.44 - 1.00 mg/dL   Calcium 8.8 (L) 8.9 - 10.3 mg/dL   GFR calc non Af Amer >60 >60 mL/min   GFR calc Af Amer >60 >60 mL/min    Comment: (NOTE) The eGFR has been calculated using the CKD EPI equation. This calculation has not been validated in all clinical situations. eGFR's persistently <60 mL/min signify possible Chronic Kidney Disease.    Anion gap 7 5 - 15     Lipid Panel     Component Value Date/Time   CHOL 205 (H) 05/11/2016 0542   TRIG 146 05/11/2016 0542   HDL 33 (L) 05/11/2016 0542   CHOLHDL 6.2 05/11/2016 0542   VLDL 29 05/11/2016 0542   LDLCALC 143 (H) 05/11/2016 0542     Lab Results  Component Value Date   HGBA1C 5.4 05/10/2016     Lab Results  Component Value Date   LDLCALC 143 (H) 05/11/2016   CREATININE 0.82 05/11/2016     HPI :  Cassidy [name] is a 82 y.o. female with a history of HTN, arthritis, dementia, HLD, tobacco abuse, homelessness and adjustment disorder who presented to [facility] on 05/10/16 complaining of chest pain and dizziness.   She currently lives in a homeless shelter. She has been in various shelters and assisted living facilities for the past 3 years or so. She was admitted last night with vague complaints. Diffiuclt to follow secondary to tangential thought process.   Troponin < 0.03 X 3 LDL 143; lytes normal, TSH 1.650. Head CT with no acute intracranial hemorrhage and mild age-related atrophy and chronic microvascular ischemic disease. 2D ECHO has been ordered but not completed.  ECG with sinus bradycardia.  HR 42. Tele shows HR in 50s. She was evaluated by psychiatry this admission who felt she was mentally sound.   Currently feeling the best she has felt in days. 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 She was in her usual state of good health until a couple days ago when she started feel nauseated and like her head was going to be "screwed off." She begged the facility to call EMS. She does not recall any chest pain or dizziness, which are listed as her chief complaint in H&P. Currently she is feeling back to normal and "great." No LE edema, orthopnea or PND. No syncope.  She has smoked intermittently since her teenage years but quit cold [place] last week. No other illicit drugs. She hasn't been taking any medications. Her med list lists Aricept which she has not taken in years.    HOSPITAL COURSE:    Dizziness: unclear etiology though stroke remains high on differential. EtOH and UDS negative, CT head without evidence of acute process, UA normal, CBC with differential unremarkable. Neurological exam fairly benign. Patient has history of constant dizziness for the last 2-3 days with waxing and waning nature and  Tele showed sinus bradycardia .Resting HR in the 40s prior to chemical stress test. Increased into the 70s during study - MRI brain, MRA head and neck No acute intracranial abnormality. Unable to complete study - Echo   EF 55-60%, no regional wall motion abnormalities, grade 1 diastolic dysfunction - Orthostatics positive , on admission,  - IVF, Zofran, meclizine Aricept stopped due to bradycardia  antihypertensives discontinued due to low BP   Chest pain:   Initial troponin negative, EKG without signs of ACS showing sinus bradycardia - Telemetry, EKG, -BRADYCARDIAC last night into the  50s to 60s, asymptomatic   Troponin negative   - GI cocktail UDS negative Cardiology following ,   TSH nl Scheduled for nuclear stress test today, low risk ,Mild fixed apical septal defect, suspect remote infarction, scar. No significant inducible or reversible ischemia with pharmacologic stress. Mild septal hypokinesis otherwise normal wall motion. Left ventricular ejection fraction 64%.OK to dc home per cardiology   Social/Psych: Patient with no identified psychiatric diagnoses. Patient brought up several occasions where people in the community accused her of being crazy over the course of her admission exam. Patient also states that she had her house taken away from her and has been living in a homeless shelter for the last 3 years. Patient is unsure of where it is that she stays or of her emergency or other family contacts.  - Social work consult -need SNF on discharge   - Psych consult: completed 10/19 Patient has no safety concerns  Patient has no major psychiatric disorders  Not recommended psychiatric medications  Patient does not need further psychiatric services  Patient will be considered psychiatrically cleared  Discharge Exam:   Blood pressure (!) 122/49, pulse (!) 55, temperature 97.9 F (36.6 C), temperature source Oral, resp. rate 15, height _0  (1.6 m), weight 60.6 kg (133 lb 9.6 oz), SpO2 99 %.      Follow-up Information    pcp. Schedule an appointment as soon as possible for a visit in 1 week(s).   Why:  hospital follow up          Signed: [facility]  05/12/2016, 1:08 PM        Time spent >45 mins  \

## 2016-05-12 NOTE — Clinical Social Work Note (Signed)
Ok per MD for d/c today back to [facility]- ALF. CSW attempted for over 2 hours to contact facility without success; phone contact was not possible and thus- requested non-emergency police to go to facility to notify staff of phone issues.  It was determined that their phone lines were down- however staff agreed via communication with the officer that it was ok for patient to return to the facility.  Fl2 and DC summary sent to facility and d/c papers completed for EMS transport. Notified patient's nurse who was unable to call report but was aware of above.  Discussed with patient who was unhappy with return to the ALF.  She stated that she wanted to remain in the hospital indefinitely and requested that the hospital provide her with an attorney to make her ex-husband pay her alimony.  Discussed possible Legal Aid option with patient and encouraged her to check on this once she returned to the facility.She is agreeable to return to the ALF as she indicated she has been homeless in the past.  She stated that she did not have any family or friends in this area and there was no one to notify of her d/c. No further CSW intervention indicated.  CSW will sign off.  Lorri Frederickonna T. Samon Dishner, LCSW [id number] (weekend coverage)

## 2016-05-12 NOTE — Progress Notes (Addendum)
[facility] PROGRESS NOTE  Cassidy BeardDolores R [name] GNF:[id number] DOB: 08/15/1933 DOA: 05/10/2016   PCP: No primary care provider on file.     Assessment/Plan: Principal Problem:   Adjustment disorder with other symptoms Active Problems:   Chest pain, rule out acute myocardial infarction   Dizziness   Bradycardia   Subacute confusional state    82 y.o. female with medical history significant of arthritis and HTN  presenting with multiple vague complaints. Level V caveat applies as patient is very tangential in her thought process and unable to give specific details about her symptoms and even changes her story when asked the same question at different times. Of note patient states that she is currently living in a shelter and has been there for years, but also states that she is homeless. She has no primary care physician. Hospital notes state the patient arrived via EMS because no further details about where EMS picked the patient up from. Patient states that she has had approximately 2-3 days of dizziness. Difficult to tell if this is constant with waxing and waning nature or intermittent. Worse with ambulation and sitting. Improves with rest. Patient states that she is still currently symptomatically even while resting in bed and not moving. Associated with nausea and generalized weakness  Assessment/Plan   Dizziness: unclear etiology though stroke remains high on differential. EtOH and UDS negative, CT head without evidence of acute process, UA normal, CBC with differential unremarkable. Neurological exam fairly benign. Patient continues to describe times a history of constant dizziness for the last 2-3 days with waxing and waning nature and continues to endorse mild dizziness while resting in bed.  - Telemetry, observation - MRI brain, MRA head and neck No acute intracranial abnormality. Unable to complete study - Echo   EF 55-60%, no regional wall motion abnormalities, grade 1  diastolic dysfunction - Orthostatics positive , on admission,  - IVF, Zofran, meclizine    Chest pain:   Initial troponin negative, EKG without signs of ACS showing sinus bradycardia - Telemetry, EKG, -BRADYCARDIAC last night into the  50s to 60s, asymptomatic   Troponin negative  - GI cocktail UDS negative Cardiology following , may need EP for bradycardia  TSH nl Scheduled for nuclear stress test today   Social/Psych: Patient with no identified psychiatric diagnoses. Patient brought up several occasions where people in the community accused her of being crazy over the course of her admission exam. Patient also states that she had her house taken away from her and has been living in a homeless shelter for the last 3 years. Patient is unsure of where it is that she stays or of her emergency or other family contacts.  - Social work consult -need SNF on discharge - Psych consult: Please assist with evaluating patient and possibly riding in a psychiatric diagnoses or at least ruling out     DVT prophylaxsis lovenox   Code Status:  Full code    Family Communication: Discussed in detail with the patient, all imaging results, lab results explained to the patient   Disposition Plan: snf on Monday      Consultants:  Cards   Procedures:  None   Antibiotics: Anti-infectives    None         HPI/Subjective: Bradycardia improved, chest pain-free.Both her chest pain and her dizziness have subsided  Objective: Vitals:   05/12/16 0741 05/12/16 0922 05/12/16 0925 05/12/16 0928  BP: (!) 106/59 132/64 (!) 166/59 (!) 150/58  Pulse: 61     Resp: 15     Temp: 97.8 F (36.6 C)     TempSrc: Oral     SpO2: 100%     Weight:      Height:        Intake/Output Summary (Last 24 hours) at 05/12/16 1122 Last data filed at 05/12/16 0900  Gross per 24 hour  Intake          1253.75 ml  Output                0 ml  Net          1253.75 ml    Exam:  Examination:  General  exam: Appears calm and comfortable  Respiratory system: Clear to auscultation. Respiratory effort normal. Cardiovascular system: S1 & S2 heard, RRR. No JVD, murmurs, rubs, gallops or clicks. No pedal edema. Gastrointestinal system: Abdomen is nondistended, soft and nontender. No organomegaly or masses felt. Normal bowel sounds heard. Central nervous system: Alert and oriented. No focal neurological deficits. Extremities: Symmetric 5 x 5 power. Skin: No rashes, lesions or ulcers Psychiatry: Judgement and insight appear normal. Mood & affect appropriate.     Data Reviewed: I have personally reviewed following labs and imaging studies  Micro Results No results found for this or any previous visit (from the past 240 hour(s)).  Radiology Reports Ct Head Wo Contrast  Result Date: 05/10/2016 CLINICAL DATA:  82 year old female with dizziness EXAM: CT HEAD WITHOUT CONTRAST TECHNIQUE: Contiguous axial images were obtained from the base of the skull through the vertex without intravenous contrast. COMPARISON:  None. FINDINGS: Brain: The ventricles and sulci are appropriate in size for patient's age. Mild periventricular and deep white matter chronic microvascular ischemic changes noted. There is no acute intracranial hemorrhage. No mass effect or midline shift noted. Vascular: No hyperdense vessel or unexpected calcification. Skull: Normal. Negative for fracture or focal lesion. Sinuses/Orbits: There is partial opacification of multiple right mastoid air cells. The left mastoid air cells and paranasal sinuses are clear. Other: None IMPRESSION: No acute intracranial hemorrhage. Mild age-related atrophy and chronic microvascular ischemic disease. If symptoms persist and there are no contraindications, MRI may provide better evaluation if clinically indicated Electronically Signed   By: Elgie Collard M.D.   On: 05/10/2016 06:12   Mr Brain Wo Contrast  Result Date: 05/10/2016 CLINICAL DATA:  Dizziness  EXAM: MRI HEAD WITHOUT CONTRAST MRA HEAD WITHOUT CONTRAST TECHNIQUE: Multiplanar, multiecho pulse sequences of the brain and surrounding structures were obtained without intravenous contrast. Angiographic images of the head were obtained using MRA technique without contrast. COMPARISON:  CT head 05/10/2016 FINDINGS: MRI HEAD FINDINGS Brain: Generalized mild atrophy. Negative for hydrocephalus. Negative for acute infarct. Chronic microvascular ischemic change in the white matter and pons. The patient was not able to complete the study and several sequences were omitted. Vascular: Normal flow void Skull and upper cervical spine: Negative Sinuses/Orbits: Right mastoid sinus effusion. No obstructing nasopharyngeal mass. Mild mucosal edema paranasal sinuses. No orbital lesion. Other: None MRA HEAD FINDINGS Both vertebral arteries widely patent to the basilar. PICA patent bilaterally. Basilar widely patent. Superior cerebellar and posterior cerebral arteries appear normal bilaterally Cavernous carotid is normal bilaterally. Anterior and middle cerebral arteries are normal bilaterally. No stenosis or large vessel occlusion Negative for cerebral aneurysm. IMPRESSION: Atrophy and chronic microvascular ischemic change. No acute intracranial abnormality. Note the patient was not able to complete this MRI of the brain Negative MRA head Electronically Signed   By:  Marlan Palau M.D.   On: 05/10/2016 19:25   Dg Chest Port 1 View  Result Date: 05/10/2016 CLINICAL DATA:  Diffuse chest pain EXAM: PORTABLE CHEST 1 VIEW COMPARISON:  01/14/2016 FINDINGS: Normal heart size and mediastinal contours. No acute infiltrate or edema. No effusion or pneumothorax. Bulky spondylosis. Osteopenia. No acute osseous findings. IMPRESSION: No active disease. Electronically Signed   By: Marnee Spring M.D.   On: 05/10/2016 07:45   Mr Maxine Glenn Headm  Result Date: 05/10/2016 CLINICAL DATA:  Dizziness EXAM: MRI HEAD WITHOUT CONTRAST MRA HEAD  WITHOUT CONTRAST TECHNIQUE: Multiplanar, multiecho pulse sequences of the brain and surrounding structures were obtained without intravenous contrast. Angiographic images of the head were obtained using MRA technique without contrast. COMPARISON:  CT head 05/10/2016 FINDINGS: MRI HEAD FINDINGS Brain: Generalized mild atrophy. Negative for hydrocephalus. Negative for acute infarct. Chronic microvascular ischemic change in the white matter and pons. The patient was not able to complete the study and several sequences were omitted. Vascular: Normal flow void Skull and upper cervical spine: Negative Sinuses/Orbits: Right mastoid sinus effusion. No obstructing nasopharyngeal mass. Mild mucosal edema paranasal sinuses. No orbital lesion. Other: None MRA HEAD FINDINGS Both vertebral arteries widely patent to the basilar. PICA patent bilaterally. Basilar widely patent. Superior cerebellar and posterior cerebral arteries appear normal bilaterally Cavernous carotid is normal bilaterally. Anterior and middle cerebral arteries are normal bilaterally. No stenosis or large vessel occlusion Negative for cerebral aneurysm. IMPRESSION: Atrophy and chronic microvascular ischemic change. No acute intracranial abnormality. Note the patient was not able to complete this MRI of the brain Negative MRA head Electronically Signed   By: Marlan Palau M.D.   On: 05/10/2016 19:25     CBC  Recent Labs Lab 05/10/16 0322 05/11/16 0542  WBC 11.5* 8.6  HGB 13.1 12.6  HCT 38.8 37.3  PLT 315 255  MCV 92.4 94.9  MCH 31.2 32.1  MCHC 33.8 33.8  RDW 12.4 12.5  LYMPHSABS 2.8  --   MONOABS 0.8  --   EOSABS 0.3  --   BASOSABS 0.0  --     Chemistries   Recent Labs Lab 05/10/16 0322 05/10/16 1056 05/11/16 0542  NA 140  --  140  K 3.8  --  4.3  CL 106  --  109  CO2 26  --  24  GLUCOSE 97  --  115*  BUN 16  --  12  CREATININE 0.91  --  0.82  CALCIUM 9.1  --  8.8*  MG  --  1.7  --   AST 18  --   --   ALT 12*  --   --    ALKPHOS 55  --   --   BILITOT 0.6  --   --    ------------------------------------------------------------------------------------------------------------------ estimated creatinine clearance is 45.3 mL/min (by C-G formula based on SCr of 0.82 mg/dL). ------------------------------------------------------------------------------------------------------------------  Recent Labs  05/10/16 1056  HGBA1C 5.4   ------------------------------------------------------------------------------------------------------------------  Recent Labs  05/11/16 0542  CHOL 205*  HDL 33*  LDLCALC 143*  TRIG 146  CHOLHDL 6.2   ------------------------------------------------------------------------------------------------------------------  Recent Labs  05/10/16 1056  TSH 1.650   ------------------------------------------------------------------------------------------------------------------ No results for input(s): VITAMINB12, FOLATE, FERRITIN, TIBC, IRON, RETICCTPCT in the last 72 hours.  Coagulation profile No results for input(s): INR, PROTIME in the last 168 hours.  No results for input(s): DDIMER in the last 72 hours.  Cardiac Enzymes  Recent Labs Lab 05/10/16 0322 05/10/16 1056 05/10/16 1548  TROPONINI <0.03 <0.03 <0.03   ------------------------------------------------------------------------------------------------------------------  Invalid input(s): POCBNP   CBG: No results for input(s): GLUCAP in the last 168 hours.     Studies: Mr Brain Wo Contrast  Result Date: 05/10/2016 CLINICAL DATA:  Dizziness EXAM: MRI HEAD WITHOUT CONTRAST MRA HEAD WITHOUT CONTRAST TECHNIQUE: Multiplanar, multiecho pulse sequences of the brain and surrounding structures were obtained without intravenous contrast. Angiographic images of the head were obtained using MRA technique without contrast. COMPARISON:  CT head 05/10/2016 FINDINGS: MRI HEAD FINDINGS Brain: Generalized mild atrophy.  Negative for hydrocephalus. Negative for acute infarct. Chronic microvascular ischemic change in the white matter and pons. The patient was not able to complete the study and several sequences were omitted. Vascular: Normal flow void Skull and upper cervical spine: Negative Sinuses/Orbits: Right mastoid sinus effusion. No obstructing nasopharyngeal mass. Mild mucosal edema paranasal sinuses. No orbital lesion. Other: None MRA HEAD FINDINGS Both vertebral arteries widely patent to the basilar. PICA patent bilaterally. Basilar widely patent. Superior cerebellar and posterior cerebral arteries appear normal bilaterally Cavernous carotid is normal bilaterally. Anterior and middle cerebral arteries are normal bilaterally. No stenosis or large vessel occlusion Negative for cerebral aneurysm. IMPRESSION: Atrophy and chronic microvascular ischemic change. No acute intracranial abnormality. Note the patient was not able to complete this MRI of the brain Negative MRA head Electronically Signed   By: Marlan Palau M.D.   On: 05/10/2016 19:25   Mr Maxine Glenn Headm  Result Date: 05/10/2016 CLINICAL DATA:  Dizziness EXAM: MRI HEAD WITHOUT CONTRAST MRA HEAD WITHOUT CONTRAST TECHNIQUE: Multiplanar, multiecho pulse sequences of the brain and surrounding structures were obtained without intravenous contrast. Angiographic images of the head were obtained using MRA technique without contrast. COMPARISON:  CT head 05/10/2016 FINDINGS: MRI HEAD FINDINGS Brain: Generalized mild atrophy. Negative for hydrocephalus. Negative for acute infarct. Chronic microvascular ischemic change in the white matter and pons. The patient was not able to complete the study and several sequences were omitted. Vascular: Normal flow void Skull and upper cervical spine: Negative Sinuses/Orbits: Right mastoid sinus effusion. No obstructing nasopharyngeal mass. Mild mucosal edema paranasal sinuses. No orbital lesion. Other: None MRA HEAD FINDINGS Both vertebral  arteries widely patent to the basilar. PICA patent bilaterally. Basilar widely patent. Superior cerebellar and posterior cerebral arteries appear normal bilaterally Cavernous carotid is normal bilaterally. Anterior and middle cerebral arteries are normal bilaterally. No stenosis or large vessel occlusion Negative for cerebral aneurysm. IMPRESSION: Atrophy and chronic microvascular ischemic change. No acute intracranial abnormality. Note the patient was not able to complete this MRI of the brain Negative MRA head Electronically Signed   By: Marlan Palau M.D.   On: 05/10/2016 19:25      Lab Results  Component Value Date   HGBA1C 5.4 05/10/2016   Lab Results  Component Value Date   LDLCALC 143 (H) 05/11/2016   CREATININE 0.82 05/11/2016       Scheduled Meds: . regadenoson      .  stroke: mapping our early stages of recovery book   Does not apply Once  . aspirin  300 mg Rectal Daily   Or  . aspirin  325 mg Oral Daily  . enoxaparin (LOVENOX) injection  40 mg Subcutaneous Q24H  . sodium chloride flush  3 mL Intravenous Q12H   Continuous Infusions: . sodium chloride 75 mL/hr (05/12/16 0111)     LOS: 0 days    Time spent: >30 MINS    [facility]  [facility] Pager [phone]. If 7PM-7AM, please contact night-coverage at [email or web address], password [facility] 05/12/2016, 11:22 AM  LOS: 0 days

## 2016-05-12 NOTE — Progress Notes (Signed)
Patient Name: Cassidy [name] Date of Encounter: 05/12/2016  Primary Cardiologist: Dr. Duke Salvia [facility] Problem List     Principal Problem:   Adjustment disorder with other symptoms Active Problems:   Chest pain, rule out acute myocardial infarction   Dizziness   Bradycardia   Subacute confusional state    Patient Profile     Cassidy [name] is an 82 y/o F with hypertension who presented with dizziness, nausea, and chest pain. She was noted to be bradycardic with heart rates in the 30s-40s.  Both her chest pain and her dizziness have subsided.  Cardiac enzymes were negative x3 and her EKG was not consistent with ischemia.   Subjective   Doing well this morning. No symptoms.   Inpatient Medications    Scheduled Meds: . regadenoson      .  stroke: mapping our early stages of recovery book   Does not apply Once  . aspirin  300 mg Rectal Daily   Or  . aspirin  325 mg Oral Daily  . enoxaparin (LOVENOX) injection  40 mg Subcutaneous Q24H  . sodium chloride flush  3 mL Intravenous Q12H   Continuous Infusions: . sodium chloride 75 mL/hr (05/12/16 0111)   PRN Meds: acetaminophen **OR** acetaminophen, gi cocktail, ketorolac, meclizine, ondansetron **OR** ondansetron (ZOFRAN) IV, oxyCODONE, traZODone   Vital Signs    Vitals:   05/12/16 0741 05/12/16 0922 05/12/16 0925 05/12/16 0928  BP: (!) 106/59 132/64 (!) 166/59 (!) 150/58  Pulse: 61     Resp: 15     Temp: 97.8 F (36.6 C)     TempSrc: Oral     SpO2: 100%     Weight:      Height:        Intake/Output Summary (Last 24 hours) at 05/12/16 0932 Last data filed at 05/12/16 0900  Gross per 24 hour  Intake          1253.75 ml  Output                0 ml  Net          1253.75 ml   Filed Weights   05/10/16 1022 05/11/16 0459 05/12/16 0430  Weight: 128 lb 11.2 oz (58.4 kg) 131 lb (59.4 kg) 133 lb 9.6 oz (60.6 kg)    Physical Exam   GEN: Well nourished, well developed, in no acute distress.  HEENT: Grossly  normal.  Neck: Supple, no JVD, carotid bruits, or masses. Cardiac: RRR, no murmurs, rubs, or gallops. No clubbing, cyanosis, edema.  Radials/DP/PT 2+ and equal bilaterally.  Respiratory:  Respirations regular and unlabored, clear to auscultation bilaterally. GI: Soft, nontender, nondistended, BS + x 4. MS: no deformity or atrophy. Skin: warm and dry, no rash. Neuro:  Strength and sensation are intact. Psych: AAOx3.  Normal affect.  Labs    CBC  Recent Labs  05/10/16 0322 05/11/16 0542  WBC 11.5* 8.6  NEUTROABS 7.6  --   HGB 13.1 12.6  HCT 38.8 37.3  MCV 92.4 94.9  PLT 315 255   Basic Metabolic Panel  Recent Labs  05/10/16 0322 05/10/16 1056 05/11/16 0542  NA 140  --  140  K 3.8  --  4.3  CL 106  --  109  CO2 26  --  24  GLUCOSE 97  --  115*  BUN 16  --  12  CREATININE 0.91  --  0.82  CALCIUM 9.1  --  8.8*  MG  --  1.7  --   PHOS  --  3.1  --    Liver Function Tests  Recent Labs  05/10/16 0322  AST 18  ALT 12*  ALKPHOS 55  BILITOT 0.6  PROT 6.2*  ALBUMIN 3.5   No results for input(s): LIPASE, AMYLASE in the last 72 hours. Cardiac Enzymes  Recent Labs  05/10/16 0322 05/10/16 1056 05/10/16 1548  TROPONINI <0.03 <0.03 <0.03   BNP Invalid input(s): POCBNP D-Dimer No results for input(s): DDIMER in the last 72 hours. Hemoglobin A1C  Recent Labs  05/10/16 1056  HGBA1C 5.4   Fasting Lipid Panel  Recent Labs  05/11/16 0542  CHOL 205*  HDL 33*  LDLCALC 143*  TRIG 146  CHOLHDL 6.2   Thyroid Function Tests  Recent Labs  05/10/16 1056  TSH 1.650    Telemetry    NSR - Personally Reviewed  ECG    NSR- Personally Reviewed  Radiology    Mr Brain Wo Contrast  Result Date: 05/10/2016 CLINICAL DATA:  Dizziness EXAM: MRI HEAD WITHOUT CONTRAST MRA HEAD WITHOUT CONTRAST TECHNIQUE: Multiplanar, multiecho pulse sequences of the brain and surrounding structures were obtained without intravenous contrast. Angiographic images of the head  were obtained using MRA technique without contrast. COMPARISON:  CT head 05/10/2016 FINDINGS: MRI HEAD FINDINGS Brain: Generalized mild atrophy. Negative for hydrocephalus. Negative for acute infarct. Chronic microvascular ischemic change in the white matter and pons. The patient was not able to complete the study and several sequences were omitted. Vascular: Normal flow void Skull and upper cervical spine: Negative Sinuses/Orbits: Right mastoid sinus effusion. No obstructing nasopharyngeal mass. Mild mucosal edema paranasal sinuses. No orbital lesion. Other: None MRA HEAD FINDINGS Both vertebral arteries widely patent to the basilar. PICA patent bilaterally. Basilar widely patent. Superior cerebellar and posterior cerebral arteries appear normal bilaterally Cavernous carotid is normal bilaterally. Anterior and middle cerebral arteries are normal bilaterally. No stenosis or large vessel occlusion Negative for cerebral aneurysm. IMPRESSION: Atrophy and chronic microvascular ischemic change. No acute intracranial abnormality. Note the patient was not able to complete this MRI of the brain Negative MRA head Electronically Signed   By: Marlan Palauharles  Clark M.D.   On: 05/10/2016 19:25   Mr Maxine GlennMra Headm  Result Date: 05/10/2016 CLINICAL DATA:  Dizziness EXAM: MRI HEAD WITHOUT CONTRAST MRA HEAD WITHOUT CONTRAST TECHNIQUE: Multiplanar, multiecho pulse sequences of the brain and surrounding structures were obtained without intravenous contrast. Angiographic images of the head were obtained using MRA technique without contrast. COMPARISON:  CT head 05/10/2016 FINDINGS: MRI HEAD FINDINGS Brain: Generalized mild atrophy. Negative for hydrocephalus. Negative for acute infarct. Chronic microvascular ischemic change in the white matter and pons. The patient was not able to complete the study and several sequences were omitted. Vascular: Normal flow void Skull and upper cervical spine: Negative Sinuses/Orbits: Right mastoid sinus  effusion. No obstructing nasopharyngeal mass. Mild mucosal edema paranasal sinuses. No orbital lesion. Other: None MRA HEAD FINDINGS Both vertebral arteries widely patent to the basilar. PICA patent bilaterally. Basilar widely patent. Superior cerebellar and posterior cerebral arteries appear normal bilaterally Cavernous carotid is normal bilaterally. Anterior and middle cerebral arteries are normal bilaterally. No stenosis or large vessel occlusion Negative for cerebral aneurysm. IMPRESSION: Atrophy and chronic microvascular ischemic change. No acute intracranial abnormality. Note the patient was not able to complete this MRI of the brain Negative MRA head Electronically Signed   By: Marlan Palauharles  Clark M.D.   On: 05/10/2016 19:25    Cardiac Studies   Lexiscan NST  05/12/16- no significant EKG changes. No arrhthymias. Radiologist interpretation pending.   Patient Profile     Ms. Robben is an 82 y/o F with hypertension who presented with dizziness, nausea, and chest pain. She was noted to be bradycardic with heart rates in the 30s-40s.  Both her chest pain and her dizziness have subsided.  Cardiac enzymes were negative x3 and her EKG was not consistent with ischemia.  Assessment & Plan    1. Chest Pain with Moderate Risk For Cardiac Etiology: Currently CP free. Cardiac enzymes negative x 3. EKG no ischemic. 2D echo with normal LV systolic function with EF of 55-60%, moderate LVH with diastolic dysfunction.  NST completed. Radiologist interpretation pending. VSS. Further w/u will be determined once stress test results return.   2. Bradycardia: Resting HR in the 40s prior to chemical stress test. Increased into the 70s during study. She is not on any BB or CCBs. K, Mg and TSH all WNL. 2D echo is ok. Seen by EP yesterday. Dr. Ladona Ridgel recommended that her aricept be discontinued as this can cause bradycardia. This has been stopped. Continue to monitor.     Signed, Robbie Lis, PA-C  05/12/2016, 9:32 AM     EP Attending  Agree with above. Would stop Aricept. No other rec's at this time pending results of lexiscan. Could be discharged if stress test is not high risk.  Leonia Reeves.D.

## 2016-06-06 ENCOUNTER — Emergency Department (HOSPITAL_COMMUNITY): Payer: Medicare HMO

## 2016-06-06 ENCOUNTER — Observation Stay (HOSPITAL_COMMUNITY): Payer: Medicare HMO | Admitting: Certified Registered"

## 2016-06-06 ENCOUNTER — Observation Stay (HOSPITAL_COMMUNITY)
Admission: EM | Admit: 2016-06-06 | Discharge: 2016-06-08 | Disposition: A | Discharge status: Home / Self Care | Payer: Medicare HMO | Attending: Internal Medicine | Admitting: Internal Medicine

## 2016-06-06 ENCOUNTER — Encounter (HOSPITAL_COMMUNITY): Payer: Self-pay | Admitting: Emergency Medicine

## 2016-06-06 ENCOUNTER — Encounter (HOSPITAL_COMMUNITY)
Admission: EM | Disposition: A | Discharge status: Home / Self Care | Payer: Self-pay | Source: Home / Self Care | Attending: Emergency Medicine

## 2016-06-06 DIAGNOSIS — R109 Unspecified abdominal pain: Secondary | ICD-10-CM | POA: Diagnosis not present

## 2016-06-06 DIAGNOSIS — I10 Essential (primary) hypertension: Secondary | ICD-10-CM | POA: Insufficient documentation

## 2016-06-06 DIAGNOSIS — Z888 Allergy status to other drugs, medicaments and biological substances status: Secondary | ICD-10-CM | POA: Insufficient documentation

## 2016-06-06 DIAGNOSIS — K529 Noninfective gastroenteritis and colitis, unspecified: Secondary | ICD-10-CM | POA: Insufficient documentation

## 2016-06-06 DIAGNOSIS — K5730 Diverticulosis of large intestine without perforation or abscess without bleeding: Secondary | ICD-10-CM | POA: Insufficient documentation

## 2016-06-06 DIAGNOSIS — R197 Diarrhea, unspecified: Secondary | ICD-10-CM

## 2016-06-06 DIAGNOSIS — D72829 Elevated white blood cell count, unspecified: Secondary | ICD-10-CM | POA: Diagnosis not present

## 2016-06-06 DIAGNOSIS — Z87891 Personal history of nicotine dependence: Secondary | ICD-10-CM | POA: Diagnosis not present

## 2016-06-06 DIAGNOSIS — F329 Major depressive disorder, single episode, unspecified: Secondary | ICD-10-CM | POA: Insufficient documentation

## 2016-06-06 DIAGNOSIS — K259 Gastric ulcer, unspecified as acute or chronic, without hemorrhage or perforation: Secondary | ICD-10-CM | POA: Diagnosis not present

## 2016-06-06 DIAGNOSIS — F4329 Adjustment disorder with other symptoms: Secondary | ICD-10-CM | POA: Diagnosis present

## 2016-06-06 DIAGNOSIS — R531 Weakness: Secondary | ICD-10-CM | POA: Insufficient documentation

## 2016-06-06 DIAGNOSIS — M1990 Unspecified osteoarthritis, unspecified site: Secondary | ICD-10-CM | POA: Insufficient documentation

## 2016-06-06 DIAGNOSIS — R1013 Epigastric pain: Secondary | ICD-10-CM

## 2016-06-06 DIAGNOSIS — M5137 Other intervertebral disc degeneration, lumbosacral region: Secondary | ICD-10-CM | POA: Insufficient documentation

## 2016-06-06 DIAGNOSIS — F4320 Adjustment disorder, unspecified: Secondary | ICD-10-CM | POA: Diagnosis not present

## 2016-06-06 DIAGNOSIS — Z23 Encounter for immunization: Secondary | ICD-10-CM | POA: Diagnosis not present

## 2016-06-06 DIAGNOSIS — Z881 Allergy status to other antibiotic agents status: Secondary | ICD-10-CM | POA: Diagnosis not present

## 2016-06-06 DIAGNOSIS — Z90710 Acquired absence of both cervix and uterus: Secondary | ICD-10-CM | POA: Insufficient documentation

## 2016-06-06 HISTORY — PX: ESOPHAGOGASTRODUODENOSCOPY (EGD) WITH PROPOFOL: SHX5813

## 2016-06-06 HISTORY — PX: COLONOSCOPY WITH PROPOFOL: SHX5780

## 2016-06-06 LAB — GASTROINTESTINAL PANEL BY PCR, STOOL (REPLACES STOOL CULTURE)
ADENOVIRUS F40/41: NOT DETECTED
Astrovirus: NOT DETECTED
CAMPYLOBACTER SPECIES: NOT DETECTED
CRYPTOSPORIDIUM: NOT DETECTED
CYCLOSPORA CAYETANENSIS: NOT DETECTED
ENTEROPATHOGENIC E COLI (EPEC): NOT DETECTED
Entamoeba histolytica: NOT DETECTED
Enteroaggregative E coli (EAEC): NOT DETECTED
Enterotoxigenic E coli (ETEC): NOT DETECTED
Giardia lamblia: NOT DETECTED
Norovirus GI/GII: NOT DETECTED
PLESIMONAS SHIGELLOIDES: NOT DETECTED
ROTAVIRUS A: NOT DETECTED
SALMONELLA SPECIES: NOT DETECTED
SAPOVIRUS (I, II, IV, AND V): NOT DETECTED
SHIGELLA/ENTEROINVASIVE E COLI (EIEC): NOT DETECTED
Shiga like toxin producing E coli (STEC): NOT DETECTED
VIBRIO SPECIES: NOT DETECTED
Vibrio cholerae: NOT DETECTED
YERSINIA ENTEROCOLITICA: NOT DETECTED

## 2016-06-06 LAB — COMPREHENSIVE METABOLIC PANEL
ALBUMIN: 3.9 g/dL (ref 3.5–5.0)
ALK PHOS: 64 U/L (ref 38–126)
ALT: 16 U/L (ref 14–54)
AST: 19 U/L (ref 15–41)
Anion gap: 10 (ref 5–15)
BILIRUBIN TOTAL: 0.6 mg/dL (ref 0.3–1.2)
BUN: 19 mg/dL (ref 6–20)
CALCIUM: 9.9 mg/dL (ref 8.9–10.3)
CO2: 24 mmol/L (ref 22–32)
Chloride: 105 mmol/L (ref 101–111)
Creatinine, Ser: 0.98 mg/dL (ref 0.44–1.00)
GFR calc Af Amer: >60 mL/min (ref >60)
GFR calc non Af Amer: 53 mL/min — ABNORMAL LOW (ref >60)
GLUCOSE: 99 mg/dL (ref 65–99)
Potassium: 5 mmol/L (ref 3.5–5.1)
Sodium: 139 mmol/L (ref 135–145)
TOTAL PROTEIN: 6.9 g/dL (ref 6.5–8.1)

## 2016-06-06 LAB — URINALYSIS, ROUTINE W REFLEX MICROSCOPIC
BILIRUBIN URINE: NEGATIVE
Glucose, UA: NEGATIVE mg/dL
KETONES UR: NEGATIVE mg/dL
Leukocytes, UA: NEGATIVE
NITRITE: NEGATIVE
PH: 6 (ref 5.0–8.0)
Protein, ur: NEGATIVE mg/dL
Specific Gravity, Urine: 1.016 (ref 1.005–1.030)

## 2016-06-06 LAB — CBC
HCT: 39.9 % (ref 36.0–46.0)
Hemoglobin: 13.5 g/dL (ref 12.0–15.0)
MCH: 31.8 pg (ref 26.0–34.0)
MCHC: 33.8 g/dL (ref 30.0–36.0)
MCV: 93.9 fL (ref 78.0–100.0)
Platelets: 338 10*3/uL (ref 150–400)
RBC: 4.25 MIL/uL (ref 3.87–5.11)
RDW: 12.5 % (ref 11.5–15.5)
WBC: 12.7 10*3/uL — ABNORMAL HIGH (ref 4.0–10.5)

## 2016-06-06 LAB — URINE MICROSCOPIC-ADD ON

## 2016-06-06 LAB — LIPASE, BLOOD: Lipase: 63 U/L — ABNORMAL HIGH (ref 11–51)

## 2016-06-06 LAB — ETHANOL: Alcohol, Ethyl (B): <5 mg/dL (ref <5)

## 2016-06-06 LAB — C-REACTIVE PROTEIN

## 2016-06-06 LAB — SEDIMENTATION RATE: Sed Rate: 70 mm/hr — ABNORMAL HIGH (ref 0–22)

## 2016-06-06 SURGERY — ESOPHAGOGASTRODUODENOSCOPY (EGD) WITH PROPOFOL
Anesthesia: Monitor Anesthesia Care

## 2016-06-06 MED ORDER — ONDANSETRON HCL 4 MG PO TABS: 4.0000 mg | ORAL_TABLET | Freq: Four times a day (QID) | ORAL | Status: DC | PRN

## 2016-06-06 MED ORDER — SUCRALFATE 1 G PO TABS
1.0000 g | ORAL_TABLET | Freq: Four times a day (QID) | ORAL | Status: DC
  Administered 2016-06-06 – 2016-06-08 (×10): 1 g via ORAL
  Filled 2016-06-06 (×10): qty 1

## 2016-06-06 MED ORDER — ENOXAPARIN SODIUM 40 MG/0.4ML ~~LOC~~ SOLN
40.0000 mg | SUBCUTANEOUS | Status: DC
  Administered 2016-06-06 – 2016-06-08 (×3): 40 mg via SUBCUTANEOUS
  Filled 2016-06-06 (×3): qty 0.4

## 2016-06-06 MED ORDER — PNEUMOCOCCAL VAC POLYVALENT 25 MCG/0.5ML IJ INJ
0.5000 mL | INJECTION | INTRAMUSCULAR | Status: AC
  Administered 2016-06-08: 0.5 mL via INTRAMUSCULAR
  Filled 2016-06-06 (×2): qty 0.5

## 2016-06-06 MED ORDER — ONDANSETRON HCL 4 MG/2ML IJ SOLN: 4.0000 mg | Freq: Four times a day (QID) | INTRAMUSCULAR | Status: DC | PRN

## 2016-06-06 MED ORDER — SENNOSIDES-DOCUSATE SODIUM 8.6-50 MG PO TABS: 1.0000 | ORAL_TABLET | Freq: Every evening | ORAL | Status: DC | PRN

## 2016-06-06 MED ORDER — TRIAMTERENE-HCTZ 37.5-25 MG PO TABS: 1.0000 | ORAL_TABLET | Freq: Every day | ORAL | Status: DC

## 2016-06-06 MED ORDER — PROPOFOL 500 MG/50ML IV EMUL
INTRAVENOUS | Status: DC | PRN
  Administered 2016-06-06: 75 ug/kg/min via INTRAVENOUS

## 2016-06-06 MED ORDER — TRAZODONE HCL 50 MG PO TABS: 25.0000 mg | ORAL_TABLET | Freq: Every evening | ORAL | Status: DC | PRN

## 2016-06-06 MED ORDER — SODIUM CHLORIDE 0.9 % IV SOLN: INTRAVENOUS | Status: DC

## 2016-06-06 MED ORDER — ACETAMINOPHEN 325 MG PO TABS: 650.0000 mg | ORAL_TABLET | Freq: Four times a day (QID) | ORAL | Status: DC | PRN

## 2016-06-06 MED ORDER — BUTAMBEN-TETRACAINE-BENZOCAINE 2-2-14 % EX AERO
INHALATION_SPRAY | CUTANEOUS | Status: DC | PRN
  Administered 2016-06-06: 1 via TOPICAL

## 2016-06-06 MED ORDER — TRIAMTERENE-HCTZ 37.5-25 MG PO TABS
1.0000 | ORAL_TABLET | Freq: Every day | ORAL | Status: DC
  Administered 2016-06-07 – 2016-06-08 (×2): 1 via ORAL
  Filled 2016-06-06 (×2): qty 1

## 2016-06-06 MED ORDER — MECLIZINE HCL 25 MG PO TABS: 25.0000 mg | ORAL_TABLET | Freq: Three times a day (TID) | ORAL | Status: DC | PRN

## 2016-06-06 MED ORDER — ACETAMINOPHEN 650 MG RE SUPP: 650.0000 mg | Freq: Four times a day (QID) | RECTAL | Status: DC | PRN

## 2016-06-06 MED ORDER — IOPAMIDOL (ISOVUE-300) INJECTION 61%
INTRAVENOUS | Status: AC
  Administered 2016-06-06: 75 mL via INTRAVENOUS
  Filled 2016-06-06: qty 100

## 2016-06-06 MED ORDER — SODIUM CHLORIDE 0.9 % IV BOLUS (SEPSIS)
500.0000 mL | Freq: Once | INTRAVENOUS | Status: AC
  Administered 2016-06-06: 500 mL via INTRAVENOUS

## 2016-06-06 MED ORDER — PANTOPRAZOLE SODIUM 40 MG PO TBEC
40.0000 mg | DELAYED_RELEASE_TABLET | Freq: Two times a day (BID) | ORAL | Status: DC
  Administered 2016-06-06 – 2016-06-08 (×5): 40 mg via ORAL
  Filled 2016-06-06 (×6): qty 1

## 2016-06-06 MED ORDER — MAGNESIUM CITRATE PO SOLN: 1.0000 | Freq: Once | ORAL | Status: DC | PRN

## 2016-06-06 MED ORDER — PROMETHAZINE HCL 25 MG/ML IJ SOLN: 6.2500 mg | Freq: Four times a day (QID) | INTRAMUSCULAR | Status: DC | PRN

## 2016-06-06 MED ORDER — SODIUM CHLORIDE 0.9% FLUSH
3.0000 mL | Freq: Two times a day (BID) | INTRAVENOUS | Status: DC
  Administered 2016-06-08: 3 mL via INTRAVENOUS

## 2016-06-06 MED ORDER — TRAZODONE HCL 50 MG PO TABS
25.0000 mg | ORAL_TABLET | Freq: Every evening | ORAL | Status: DC | PRN
  Administered 2016-06-08: 25 mg via ORAL
  Filled 2016-06-06: qty 1

## 2016-06-06 MED ORDER — HYDROCODONE-ACETAMINOPHEN 5-325 MG PO TABS
1.0000 | ORAL_TABLET | ORAL | Status: DC | PRN
Start: 2016-06-06 — End: 2016-06-08

## 2016-06-06 MED ORDER — BACID PO TABS
2.0000 | ORAL_TABLET | Freq: Three times a day (TID) | ORAL | Status: DC
  Administered 2016-06-06 – 2016-06-08 (×7): 2 via ORAL
  Filled 2016-06-06 (×8): qty 2

## 2016-06-06 MED ORDER — SODIUM CHLORIDE 0.9 % IV SOLN
INTRAVENOUS | Status: DC
  Administered 2016-06-06 – 2016-06-08 (×5): via INTRAVENOUS

## 2016-06-06 MED ORDER — LACTATED RINGERS IV SOLN
INTRAVENOUS | Status: DC | PRN
  Administered 2016-06-06: 14:00:00 via INTRAVENOUS

## 2016-06-06 MED ORDER — MELOXICAM 7.5 MG PO TABS
15.0000 mg | ORAL_TABLET | Freq: Every day | ORAL | Status: DC
  Filled 2016-06-06: qty 1

## 2016-06-06 MED ORDER — SERTRALINE HCL 25 MG PO TABS
25.0000 mg | ORAL_TABLET | Freq: Every day | ORAL | Status: DC
  Administered 2016-06-06 – 2016-06-07 (×2): 25 mg via ORAL
  Filled 2016-06-06 (×2): qty 1

## 2016-06-06 NOTE — ED Triage Notes (Addendum)
Pt reports abdominal pain starting yesterday, reports episodes of diarrhea. Hx IBS. Pt coming from shelter.

## 2016-06-06 NOTE — ED Notes (Signed)
Gastro MD at bedside  

## 2016-06-06 NOTE — ED Notes (Signed)
Pt presents with 3 month hx of diarrhea, no blood.  Pain started yesterday afternoon 3/10 pain.  Pt reports fatigue.

## 2016-06-06 NOTE — Progress Notes (Addendum)
Report received from [place], [place] for admission to [phone]. Report then given to Delcine, RN and Ireti, RN who will provided care for patient on floor.

## 2016-06-06 NOTE — ED Notes (Signed)
Pt was cleaned up again.

## 2016-06-06 NOTE — Progress Notes (Signed)
CSW has confirmed with facility that [facility] [place]. Patient presently confused and irate- unable to be assessed by CSW. Per MD, Patient being admitted. Patient to be followed by inpatient CSW for dispositon.          Lance MussAshley Gardner,MSW, LCSW [facility] Clinical Social Worker [phone]

## 2016-06-06 NOTE — Transfer of Care (Signed)
Immediate Anesthesia Transfer of Care Note  Patient: Cassidy [name]  Procedure(s) Performed: Procedure(s): ESOPHAGOGASTRODUODENOSCOPY (EGD) WITH PROPOFOL (N/A) COLONOSCOPY WITH PROPOFOL (N/A)  Patient Location: Endoscopy Unit  Anesthesia Type:MAC  Level of Consciousness: awake, alert  and oriented  Airway & Oxygen Therapy: Patient Spontanous Breathing  Post-op Assessment: Report given to RN  Post vital signs: Reviewed and stable  Last Vitals:  Vitals:   06/06/16 1256 06/06/16 1344  BP: (!) 127/45 (!) 146/91  Pulse: 79 (!) 57  Resp: 17 (!) 22  Temp: 36.8 C 37 C    Last Pain:  Vitals:   06/06/16 1344  TempSrc: Oral  PainSc:          Complications: No apparent anesthesia complications

## 2016-06-06 NOTE — ED Provider Notes (Signed)
This patient was seen by my orienting PA and by myself. Please see her note for further.  Briefly, patient presented with diarrhea and epigastric abdominal pain. On exam her abdomen is soft and she is epigastric abdominal tenderness to palpation. She denies any vomiting. She denies hematochezia. Patient's lipase returned mildly elevated at 63. CMP is unremarkable. CBCs were remarkable for white count of 12,700. CT abdomen and pelvis with contrast was obtained which showed severe thickening of the antrum of the stomach. Its concerning for malignancy versus severe gastritis. Recommends further evaluation with upper endoscopy. During the patient's emergency department stay she has some periods of confusion. She is generally alert and oriented and relatively good with a history. At times that she [facility] [place] [facility] and is irritable.  With this history and scanned with her difficulty with her memory we feel the patient would benefit from inpatient stay. Patient agrees with plan for admission.  I consulted for admission with [facility] hospitalist. Marlowe KaysSara Wertman, NP accepted the patient for admission. She asked that I consult GI.   I consulted with GI Dr. Matthias HughsBuccini who will be by to see the patient in consult.   Diarrhea, unspecified type  Acute epigastric pain       Everlene FarrierWilliam Darnella Zeiter, PA-C 06/06/16 1042    Loren Raceravid Yelverton, MD 06/06/16 1148

## 2016-06-06 NOTE — ED Provider Notes (Signed)
[facility]-EMERGENCY DEPT Provider Note   CSN: [id number] Arrival date & time: 06/06/16  0453     History   Chief Complaint Chief Complaint  Patient presents with  . Abdominal Pain  . Diarrhea    HPI Cassidy [name] is a 82 y.o. female presenting to the emergency department with 12 hours of  Waxing and waning epigastric "gargling", achy, dull, 3-4/10 pain, radiating from left to right. She has been experiencing non-bloody diarrhea for the past 3 months and has been seen for this two months ago. The pain became progressively worse and is aggravated by sitting up and better when lying down. She has not tried anything for the pain. She also endorses mild nausea over the last 12 hours, but not currently. She has been hydrating well and eating small meals or saltines. She states that she lives in a shelter and has a female partner at said shelter, but has not been sexually active. She denies alcohol or drug use. She is a 10-year former smoker as of one month ago. She denies chest pain, sob, vomiting, blood in her stool, dysuria, hematuria or urinary frequency.  HPI   Past Medical History:  Diagnosis Date  . Arthritis   . HTN (hypertension)     Patient Active Problem List   Diagnosis Date Noted  . Intractable abdominal pain 06/06/2016  . Diarrhea 06/06/2016  . Leukocytosis 06/06/2016  . Essential hypertension   . Chest pain, rule out acute myocardial infarction 05/10/2016  . Dizziness 05/10/2016  . Bradycardia 05/10/2016  . Subacute confusional state 05/10/2016  . Adjustment disorder with other symptoms 05/10/2016    Past Surgical History:  Procedure Laterality Date  . ABDOMINAL HYSTERECTOMY    . TOTAL KNEE ARTHROPLASTY    . TUBAL LIGATION      OB History    No data available       Home Medications    Prior to Admission medications   Medication Sig Start Date End Date Taking? Authorizing Provider  lactobacillus acidophilus (BACID) TABS tablet Take 1 tablet by mouth  daily. For 30 days 05/09/16 06/07/16 Yes Historical Provider, MD  meclizine (ANTIVERT) 25 MG tablet Take 1 tablet (25 mg total) by mouth 3 (three) times daily as needed for dizziness. 05/12/16  Yes Richarda OverlieNayana Abrol, MD  meloxicam (MOBIC) 15 MG tablet Take 15 mg by mouth daily.   Yes Historical Provider, MD  Multiple Vitamin (MULTIVITAMIN WITH MINERALS) TABS tablet Take 1 tablet by mouth daily.   Yes Historical Provider, MD  sertraline (ZOLOFT) 25 MG tablet Take 25 mg by mouth at bedtime. 05/09/16  Yes Historical Provider, MD  traZODone (DESYREL) 50 MG tablet Take 0.5 tablets (25 mg total) by mouth at bedtime as needed for sleep. 05/12/16  Yes Richarda OverlieNayana Abrol, MD  triamterene-hydrochlorothiazide (MAXZIDE-25) 37.5-25 MG tablet Take 1 tablet by mouth daily.   Yes Historical Provider, MD   Patient expresses that she has only been taking zoloft at home.  Family History Family History  Problem Relation Age of Onset  . Adopted: Yes    Social History Social History  Substance Use Topics  . Smoking status: Former Smoker    Packs/day: 1.00    Years: 10.00    Types: Cigarettes    Quit date: 05/01/2016  . Smokeless tobacco: Never Used  . Alcohol use No     Allergies   Ciprofloxacin hcl; Prednisone; and Zantac [ranitidine] Patient denies any allergies  Review of Systems Review of Systems  Constitutional: Negative for appetite  change, chills and fever.  HENT: Negative for congestion and trouble swallowing.   Eyes: Negative for visual disturbance.  Respiratory: Negative for cough, chest tightness, shortness of breath, wheezing and stridor.   Cardiovascular: Negative for chest pain and palpitations.  Gastrointestinal: Positive for abdominal pain, diarrhea and nausea. Negative for abdominal distention, anal bleeding, blood in stool, constipation, rectal pain and vomiting.  Genitourinary: Negative for decreased urine volume, difficulty urinating, dysuria, enuresis, flank pain, frequency, hematuria,  pelvic pain, urgency and vaginal bleeding.  Musculoskeletal: Negative for gait problem, neck pain and neck stiffness.  Skin: Negative for color change, pallor and rash.  Neurological: Positive for weakness.       Patient reports feeling too weak to use the restroom     Physical Exam Updated Vital Signs BP (!) 123/52   Pulse 64   Temp 98.6 F (37 C) (Oral)   Resp 20   Ht 5\' 3"  (1.6 m)   Wt 59 kg   SpO2 100%   BMI 23.03 kg/m   Physical Exam  Constitutional: She is oriented to person, place, and time. She appears well-developed and well-nourished.  Non-toxic appearing  HENT:  Head: Atraumatic.  Eyes: Pupils are equal, round, and reactive to light. Right eye exhibits no discharge. Left eye exhibits no discharge.  Neck: Normal range of motion. Neck supple. No JVD present. No tracheal deviation present.  Cardiovascular: Normal rate, regular rhythm and normal heart sounds.   No murmur heard. Pulmonary/Chest: Effort normal and breath sounds normal. No stridor. No respiratory distress. She has no wheezes.  Abdominal: Soft. Bowel sounds are normal. She exhibits no distension and no mass. There is tenderness. There is no rebound and no guarding.  Some tenderness in the epigastric region, more so on the left. No peritoneal signs, distention, masses, rebound. No CVA tenderness or tenderness to deep palpation of lower quadrants.  Musculoskeletal: Normal range of motion.  Neurological: She is alert and oriented to person, place, and time.  Skin: Skin is warm and dry. No rash noted. No erythema. No pallor.  Psychiatric: She has a normal mood and affect.  Nursing note and vitals reviewed.    ED Treatments / Results  Labs (all labs ordered are listed, but only abnormal results are displayed) Labs Reviewed  LIPASE, BLOOD - Abnormal; Notable for the following:       Result Value   Lipase 63 (*)    All other components within normal limits  COMPREHENSIVE METABOLIC PANEL - Abnormal;  Notable for the following:    GFR calc non Af Amer 53 (*)    All other components within normal limits  CBC - Abnormal; Notable for the following:    WBC 12.7 (*)    All other components within normal limits  URINALYSIS, ROUTINE W REFLEX MICROSCOPIC (NOT AT [facility]) - Abnormal; Notable for the following:    Hgb urine dipstick MODERATE (*)    All other components within normal limits  URINE MICROSCOPIC-ADD ON - Abnormal; Notable for the following:    Squamous Epithelial / LPF 0-5 (*)    Bacteria, UA MANY (*)    All other components within normal limits  SEDIMENTATION RATE - Abnormal; Notable for the following:    Sed Rate 70 (*)    All other components within normal limits  GASTROINTESTINAL PANEL BY PCR, STOOL (REPLACES STOOL CULTURE)  URINE CULTURE  OVA + PARASITE EXAM  ETHANOL  C-REACTIVE PROTEIN  SURGICAL PATHOLOGY    EKG  EKG Interpretation None  Radiology Ct Abdomen Pelvis W Contrast  Result Date: 06/06/2016 CLINICAL DATA:  Constant diarrhea for 3 months EXAM: CT ABDOMEN AND PELVIS WITH CONTRAST TECHNIQUE: Multidetector CT imaging of the abdomen and pelvis was performed using the standard protocol following bolus administration of intravenous contrast. CONTRAST:  1 ISOVUE-300 IOPAMIDOL (ISOVUE-300) INJECTION 61% COMPARISON:  None. FINDINGS: Lower chest: Lung bases are clear. Hepatobiliary: No focal liver abnormality is seen. No gallstones, gallbladder wall thickening, or biliary dilatation. Pancreas: Unremarkable. No pancreatic ductal dilatation or surrounding inflammatory changes. Spleen: Normal in size without focal abnormality. Adrenals/Urinary Tract: Adrenal glands are unremarkable. Kidneys are normal, without renal calculi, focal lesion, or hydronephrosis. Bladder is unremarkable. Stomach/Bowel: Focal severe thickening of the antrum of the stomach. Air-fluid levels throughout the colon consistent with diarrhea. No pneumatosis, pneumoperitoneum or portal venous gas.  Diverticulosis of the sigmoid colon without evidence of diverticulitis. Vascular/Lymphatic: Normal caliber abdominal aorta with atherosclerosis. No lymphadenopathy. Reproductive: Status post hysterectomy. No adnexal masses. Other: No abdominal wall hernia or abnormality. No abdominopelvic ascites. Musculoskeletal: No lytic or sclerotic osseous lesion. Degenerative disc disease with disc height loss at L5-S1 with bilateral facet arthropathy. Bilateral foraminal narrowing at L5-S1. IMPRESSION: 1. Focal severe thickening of the antrum of the stomach. The appearance is concerning for malignancy versus severe gastritis. Recommend further evaluation with upper endoscopy. 2. Air-fluid levels throughout the colon consistent with diarrhea. Electronically Signed   By: Elige Ko   On: 06/06/2016 09:37    Procedures Procedures (including critical care time)  Medications Ordered in ED Medications  0.9 %  sodium chloride infusion (not administered)  acetaminophen (TYLENOL) tablet 650 mg ( Oral MAR Hold 06/06/16 1338)    Or  acetaminophen (TYLENOL) suppository 650 mg ( Rectal MAR Hold 06/06/16 1338)  HYDROcodone-acetaminophen (NORCO/VICODIN) 5-325 MG per tablet 1-2 tablet ( Oral MAR Hold 06/06/16 1338)  traZODone (DESYREL) tablet 25 mg ( Oral MAR Hold 06/06/16 1338)  senna-docusate (Senokot-S) tablet 1 tablet ( Oral MAR Hold 06/06/16 1338)  ondansetron (ZOFRAN) tablet 4 mg ( Oral MAR Hold 06/06/16 1338)    Or  ondansetron (ZOFRAN) injection 4 mg ( Intravenous MAR Hold 06/06/16 1338)  enoxaparin (LOVENOX) injection 40 mg ( Subcutaneous Automatically Held 06/14/16 1300)  sodium chloride flush (NS) 0.9 % injection 3 mL ( Intravenous Automatically Held 06/14/16 2200)  0.9 %  sodium chloride infusion (not administered)  lactobacillus acidophilus (BACID) tablet 2 tablet ( Oral Automatically Held 06/14/16 2200)  meclizine (ANTIVERT) tablet 25 mg ( Oral MAR Hold 06/06/16 1338)  meloxicam (MOBIC) tablet 15 mg (  Oral Automatically Held 06/14/16 1000)  sertraline (ZOLOFT) tablet 25 mg ( Oral Automatically Held 06/14/16 2200)  triamterene-hydrochlorothiazide (MAXZIDE-25) 37.5-25 MG per tablet 1 tablet ( Oral Automatically Held 06/15/16 1000)  sodium chloride 0.9 % bolus 500 mL (0 mLs Intravenous Stopped 06/06/16 1208)  iopamidol (ISOVUE-300) 61 % injection (75 mLs Intravenous Contrast Given 06/06/16 0855)     Initial Impression / Assessment and Plan / ED Course  I have reviewed the triage vital signs and the nursing notes.  Pertinent labs & imaging results that were available during my care of the patient were reviewed by me and considered in my medical decision making (see chart for details).  Clinical Course    REONNA FINLAYSON is a 82 y.o. female presenting to the emergency department with 12 hours of 3-4/10 epigastric "gargling", achy, dull, pain, radiating from left to right and three months of chronic non-bloody diarrhea. Denies chest pain, sob, vomiting, blood in  her stool, dysuria, hematuria or urinary frequency. Patient is irritable with nursing staff at times. Stating that "they are not cleaning me up fast enough" and making statements like "I never had this bad of care before" and refusing to go to CT. She was very friendly and cooperative with me and agreed with treatment plan. I questioned some aspects of dementia possibly contributing.  On exam patient had tenderness to palpation of epigastric region, but no tenderness in lower quadrants, rebound tenderness, mass or involuntary guarding. Her abdomen was soft and non-distended. No ecchymosis. Lipase slightly elevated at 63 Leukocytosis 12.7 EKG- no stemi  Differential for Patient's presentation included pancreatitis, GI infectious process, gastric ulcer, gastritis.   CT scan showed thickening of the stomach antrum concerning for malignancy vs gastritis, recommending endoscopy. GI was consulted and patient was admitted for observation to  medical service. PA Will Dansie spoke with Dr. Matthias HughsBuccini GI and Durenda AgeSarah Wertman admitting NP regarding admission to medical service.  Final Clinical Impressions(s) / ED Diagnoses   Final diagnoses:  Diarrhea, unspecified type  Acute epigastric pain    New Prescriptions Current Discharge Medication List       Georgiana ShoreJessica B Mitchell, GeorgiaPA 06/06/16 1702    Loren Raceravid Yelverton, MD 06/07/16 [phone]

## 2016-06-06 NOTE — Progress Notes (Signed)
Patient's endoscopy showed a deep, benign-appearing antral ulcer which clearly accounts for the radiographic abnormality in that area seen on her CT scan today. It may well also account for her abdominal pain.  Patient's colonoscopy was negative except for diverticulosis. Correlating with her history of diarrhea, there was no formed stool whatsoever within the colonic lumen, despite the absence of any preprocedural prep. Random biopsies are pending to check for microscopic colitis.  Recommendations:  1. Have stopped the patient's meloxicam 2. Have started high-dose oral PPI therapy 3. Have started sucralfate 4. Have ordered mechanical soft diet for now; if tolerated, that could be advanced. 5. Await results of duodenal, terminal ileal, and colonic biopsies from today. 6. Await GI pathogen panel results.  Cassidy [name], M.D. Pager [phone] If no answer or after 5 PM call [phone]

## 2016-06-06 NOTE — ED Notes (Signed)
Pt continues to be irate with this RN and other staff, despite reassurance and explanation of process and plan of care.  Requested to be transferred to another hospital.  States she has not been cleaned or changed from her incontinence and that no one has done anything for her, this visit or a previous visit, which appears it was not at this hospital.  [facility].  WarrenDanise, PA, Lone OakMitchell, GeorgiaPA student, Amy, NT, Marylene LandAngela RN, and CraneKelly with CT at bedside discussing options with pt.  Pt has requested a different nurse.  Lawson FiscalLori, Consulting civil engineercharge RN has been aware of pt's complaints that initially started prior to this RNs arrival.  Pt has agreed to go to CT at this time and is being transported to have scan completed as soon as she is changed again, [facility]

## 2016-06-06 NOTE — ED Notes (Signed)
Attempted report 

## 2016-06-06 NOTE — Op Note (Signed)
[facility] Patient Name: Cassidy LisDolores Hartis Procedure Date : 06/06/2016 MRN: [id number] Attending MD: Bernette Redbirdobert Khylon Davies , MD Date of Birth: 03/22/1934 CSN: [id number] Age: 82 Admit Type: Inpatient Procedure:                Upper GI endoscopy Indications:              Abnormal CT of the GI tract Providers:                Bernette Redbirdobert Amadou Katzenstein, MD, Dwain SarnaPatricia Ford, RN, Oletha Blendavida                            Shoffner, Technician Referring MD:              Medicines:                Propofol per Anesthesia Complications:            No immediate complications. Estimated Blood Loss:     Estimated blood loss was minimal. Procedure:                Pre-Anesthesia Assessment:                           - Prior to the procedure, a History and Physical                            was performed, and patient medications and                            allergies were reviewed. The patient's tolerance of                            previous anesthesia was also reviewed. The risks                            and benefits of the procedure and the sedation                            options and risks were discussed with the patient.                            All questions were answered, and informed consent                            was obtained. Prior Anticoagulants: The patient has                            taken no previous anticoagulant or antiplatelet                            agents. ASA Grade Assessment: II - A patient with                            mild systemic disease. After reviewing the risks  and benefits, the patient was deemed in                            satisfactory condition to undergo the procedure.                           After obtaining informed consent, the endoscope was                            passed under direct vision. Throughout the                            procedure, the patient's blood pressure, pulse, and                            oxygen  saturations were monitored continuously. The                            [id number] ([id number] scope was introduced through the                            mouth, and advanced to the second part of duodenum.                            The upper GI endoscopy was technically difficult                            and complex due to abnormal anatomy. The patient                            tolerated the procedure well. Scope In: Scope Out: Findings:      The examined esophagus was normal.      One non-bleeding deeply-cratered gastric ulcer with pigmented material       but no visible vessel was found on the posterior aspect of the gastric       antrum. The lesion was 25 mm in largest dimension and had apparent food       debris within it. Biopsies were taken with a cold forceps for histology       from the margin of the ulcer.      Associated deformity was found in the gastric antrum from edema       surrounding the ulcer.      Diffuse mildly erythematous mucosa was found in the second portion of       the duodenum. Biopsies were taken with a cold forceps for histology to       check for celiac disease in view of the patient's diarrhea.      The exam of the duodenum was otherwise normal.      The cardia and gastric fundus were normal on retroflexion. Impression:               - Normal esophagus.                           - Non-bleeding gastric ulcer with pigmented  material. Biopsied. This might acount for the                            patient's abdominal pain. The patient has                            previously been on meloxicam, which might account                            for this finding.                           - Deformity in the gastric antrum related to edema                            from the ulcer.                           - Erythematous duodenopathy. Moderate Sedation:      This patient was sedated with monitored anesthesia care, not moderate        sedation. Recommendation:           - Await pathology results.                           - Perform an H. pylori serology.                           - Await pathology results.                           - Perform a colonoscopy today.                           - Resume previous diet.                           - Use Protonix (pantoprazole) 40 mg PO daily.                           - Use sucralfate tablets 1 gram PO QID for 4 weeks.                           - No aspirin, ibuprofen, naproxen, or other                            non-steroidal anti-inflammatory drugs.                           - Continue present medications. Procedure Code(s):        --- Professional ---                           [id number], Esophagogastroduodenoscopy, flexible,                            transoral; with biopsy, single or multiple Diagnosis  Code(s):        --- Professional ---                           K25.9, Gastric ulcer, unspecified as acute or                            chronic, without hemorrhage or perforation                           R93.3, Abnormal findings on diagnostic imaging of                            other parts of digestive tract CPT copyright 2016 [employer]. All rights reserved. The codes documented in this report are preliminary and upon coder review may  be revised to meet current compliance requirements. Bernette Redbird, MD 06/06/2016 3:10:08 PM This report has been signed electronically. Number of Addenda: 0

## 2016-06-06 NOTE — H&P (Signed)
History and Physical    Cassidy [name] [id number] DOB: 04-Oct-1933 DOA: 06/06/2016   PCP: No primary care provider on file.   Patient coming from:  [facility]  Chief Complaint: Abdominal pain  and diarrhea  HPI: Cassidy [name] is a 82 y.o. female  Recently hospitalized on 10/19-10/21 for dizziness and chest pain with essentially negative workup, now presenting with 1 day history of intractable abdominal pain, nausea and severe diarrhea, with 5 stools at the ED. She reports having chronic lose stools but these were "different" watery.  denies any food poisoning, bloody stools or emesis. Abdominal pain is dull, 3/4, radiating across the lower abdomen. Denies any sick contacts. He denies any fever, chills or night sweats. No chest pain or shortness of breath.  Denies any dizziness or vertigo. He denies any rashes or cuts.Denies any myalgias. No recent antibiotics or changes in her meds. She reports living in a shelter, but it appears per records that she lives in [facility] . Denies tobacco or ETOH. Denies dysuria or hematuria, but does report frequency. No confusion is noted.   ED Course:  BP (!) 146/51   Pulse (!) 50   Temp 97.4 F (36.3 C) (Oral)   Resp 10   Ht 5' 3" (1.6 m)   Wt 59 kg (130 lb)   SpO2 97%   BMI 23.03 kg/m   Lipase 63  Alk  phosphatase 64 AST and AlLT normal bilirubin  normal 0.6 . Troponin is an 0.03  white count 12.7 hemoglobin 13.5 platelets 338 glucose 99  CT abdomen and pelvis  severe thickening of the antrum of the stomach. The appearance is concerning for malignancy versus severe gastritis Review of Systems: As per HPI otherwise 10 point review of systems negative.   Past Medical History:  Diagnosis Date  . Arthritis   . HTN (hypertension)     Past Surgical History:  Procedure Laterality Date  . ABDOMINAL HYSTERECTOMY    . TOTAL KNEE ARTHROPLASTY    . TUBAL LIGATION      Social History Social History   Social History  .  Marital status: Married    Spouse name: N/A  . Number of children: N/A  . Years of education: N/A   Occupational History  . Not on file.   Social History Main Topics  . Smoking status: Former Smoker    Packs/day: 1.00    Years: 10.00    Types: Cigarettes    Quit date: 05/01/2016  . Smokeless tobacco: Never Used  . Alcohol use No  . Drug use: No  . Sexual activity: Not on file   Other Topics Concern  . Not on file   Social History Narrative  . No narrative on file     Allergies  Allergen Reactions  . Ciprofloxacin Hcl Other (See Comments)    Reaction: unknown   . Prednisone Other (See Comments)    Reaction: unknown   . Zantac [Ranitidine] Other (See Comments)    Reaction: unknown  "too strong"     Family History  Problem Relation Age of Onset  . Adopted: Yes      Prior to Admission medications   Medication Sig Start Date End Date Taking? Authorizing Provider  lactobacillus acidophilus (BACID) TABS tablet Take 1 tablet by mouth daily. For 30 days 05/09/16 06/07/16 Yes Historical Provider, MD  meclizine (ANTIVERT) 25 MG tablet Take 1 tablet (25 mg total) by mouth 3 (three) times daily as needed for  dizziness. 05/12/16  Yes Reyne Dumas, MD  meloxicam (MOBIC) 15 MG tablet Take 15 mg by mouth daily.   Yes Historical Provider, MD  Multiple Vitamin (MULTIVITAMIN WITH MINERALS) TABS tablet Take 1 tablet by mouth daily.   Yes Historical Provider, MD  sertraline (ZOLOFT) 25 MG tablet Take 25 mg by mouth at bedtime. 05/09/16  Yes Historical Provider, MD  traZODone (DESYREL) 50 MG tablet Take 0.5 tablets (25 mg total) by mouth at bedtime as needed for sleep. 05/12/16  Yes Reyne Dumas, MD  triamterene-hydrochlorothiazide (MAXZIDE-25) 37.5-25 MG tablet Take 1 tablet by mouth daily.   Yes Historical Provider, MD    Physical Exam:    Vitals:   06/06/16 1030 06/06/16 1045 06/06/16 1100 06/06/16 1115  BP: 124/99 (!) 122/48 138/67 (!) 146/51  Pulse: 62 (!) 58 69 (!) 50    Resp: _0 Temp:      TempSrc:      SpO2: 98% 96% 99% 97%  Weight:      Height:           Constitutional: NAD, calm, uncomfortable due to abdominal pain. Non  toxic appearing Vitals:   06/06/16 1030 06/06/16 1045 06/06/16 1100 06/06/16 1115  BP: 124/99 (!) 122/48 138/67 (!) 146/51  Pulse: 62 (!) 58 69 (!) 50  Resp: _1 Temp:      TempSrc:      SpO2: 98% 96% 99% 97%  Weight:      Height:       Eyes: PERRL, lids and conjunctivae normal ENMT: Mucous membranes are moist. Posterior pharynx clear of any exudate or lesions. Multiple missing teeth, Neck: normal, supple, no masses, no thyromegaly Respiratory: clear to auscultation bilaterally, no wheezing, no crackles. Normal respiratory effort. No accessory muscle use.  Cardiovascular: Regular rate and rhythm, no murmurs / rubs / gallops. No extremity edema. 2+ pedal pulses. No carotid bruits.  Abdomen:  Tenderness in the epigastrium radiating to the lower quadrants. Non distended  no masses palpated. No hepatosplenomegaly. Bowel sounds active  Musculoskeletal: no clubbing / cyanosis. No joint deformity upper and lower extremities. Good ROM, no contractures. Normal muscle tone.  Skin: no rashes, lesions, ulcers.  Neurologic: CN 2-12 grossly intact. Sensation intact, DTR normal. Strength 5/5 in all 4.  Psychiatric: Normal judgment and insight. Alert and oriented x 3. Normal mood.     Labs on Admission: I have personally reviewed following labs and imaging studies  CBC:  Recent Labs Lab 06/06/16 0459  WBC 12.7*  HGB 13.5  HCT 39.9  MCV 93.9  PLT 683    Basic Metabolic Panel:  Recent Labs Lab 06/06/16 0459  NA 139  K 5.0  CL 105  CO2 24  GLUCOSE 99  BUN 19  CREATININE 0.98  CALCIUM 9.9    GFR: Estimated Creatinine Clearance: 37.9 mL/min (by C-G formula based on SCr of 0.98 mg/dL).  Liver Function Tests:  Recent Labs Lab 06/06/16 0459  AST 19  ALT 16  ALKPHOS 64  BILITOT 0.6  PROT 6.9   ALBUMIN 3.9    Recent Labs Lab 06/06/16 0459  LIPASE 63*   No results for input(s): AMMONIA in the last 168 hours.  Coagulation Profile: No results for input(s): INR, PROTIME in the last 168 hours.  Cardiac Enzymes: No results for input(s): CKTOTAL, CKMB, CKMBINDEX, TROPONINI in the last 168 hours.  BNP (last 3 results) No results for input(s): PROBNP in the last 8760 hours.  HbA1C: No  results for input(s): HGBA1C in the last 72 hours.  CBG: No results for input(s): GLUCAP in the last 168 hours.  Lipid Profile: No results for input(s): CHOL, HDL, LDLCALC, TRIG, CHOLHDL, LDLDIRECT in the last 72 hours.  Thyroid Function Tests: No results for input(s): TSH, T4TOTAL, FREET4, T3FREE, THYROIDAB in the last 72 hours.  Anemia Panel: No results for input(s): VITAMINB12, FOLATE, FERRITIN, TIBC, IRON, RETICCTPCT in the last 72 hours.  Urine analysis:    Component Value Date/Time   COLORURINE YELLOW 06/06/2016 0512   APPEARANCEUR CLEAR 06/06/2016 0512   LABSPEC 1.016 06/06/2016 0512   PHURINE 6.0 06/06/2016 0512   GLUCOSEU NEGATIVE 06/06/2016 0512   HGBUR MODERATE (A) 06/06/2016 0512   BILIRUBINUR NEGATIVE 06/06/2016 0512   KETONESUR NEGATIVE 06/06/2016 0512   PROTEINUR NEGATIVE 06/06/2016 0512   NITRITE NEGATIVE 06/06/2016 0512   LEUKOCYTESUR NEGATIVE 06/06/2016 0512    Sepsis Labs: _0 (procalcitonin:4,lacticidven:4) )No results found for this or any previous visit (from the past 240 hour(s)).   Radiological Exams on Admission: Ct Abdomen Pelvis W Contrast  Result Date: 06/06/2016 CLINICAL DATA:  Constant diarrhea for 3 months EXAM: CT ABDOMEN AND PELVIS WITH CONTRAST TECHNIQUE: Multidetector CT imaging of the abdomen and pelvis was performed using the standard protocol following bolus administration of intravenous contrast. CONTRAST:  1 ISOVUE-300 IOPAMIDOL (ISOVUE-300) INJECTION 61% COMPARISON:  None. FINDINGS: Lower chest: Lung bases are clear.  Hepatobiliary: No focal liver abnormality is seen. No gallstones, gallbladder wall thickening, or biliary dilatation. Pancreas: Unremarkable. No pancreatic ductal dilatation or surrounding inflammatory changes. Spleen: Normal in size without focal abnormality. Adrenals/Urinary Tract: Adrenal glands are unremarkable. Kidneys are normal, without renal calculi, focal lesion, or hydronephrosis. Bladder is unremarkable. Stomach/Bowel: Focal severe thickening of the antrum of the stomach. Air-fluid levels throughout the colon consistent with diarrhea. No pneumatosis, pneumoperitoneum or portal venous gas. Diverticulosis of the sigmoid colon without evidence of diverticulitis. Vascular/Lymphatic: Normal caliber abdominal aorta with atherosclerosis. No lymphadenopathy. Reproductive: Status post hysterectomy. No adnexal masses. Other: No abdominal wall hernia or abnormality. No abdominopelvic ascites. Musculoskeletal: No lytic or sclerotic osseous lesion. Degenerative disc disease with disc height loss at L5-S1 with bilateral facet arthropathy. Bilateral foraminal narrowing at L5-S1. IMPRESSION: 1. Focal severe thickening of the antrum of the stomach. The appearance is concerning for malignancy versus severe gastritis. Recommend further evaluation with upper endoscopy. 2. Air-fluid levels throughout the colon consistent with diarrhea. Electronically Signed   By: Kathreen Devoid   On: 06/06/2016 09:37    EKG: Independently reviewed.  Assessment/Plan Active Problems:   Intractable abdominal pain   Adjustment disorder with other symptoms   Diarrhea   Leukocytosis    Intractable abdominal pain with  Acute on chronic diarrhea and dehydration  viral versus other etilology. 5 watery episodes of stools since admission. Continues to be symtomatic. Lipase 63 .Alk  phosphatase 64 AST and AlLT normal bilirubin  normal 0.6.   white count 12.7  CT abdomen and pelvis  severe thickening of the antrum of the stomach concerning for  malignancy versus severe gastritis. Afebrile   Will admit for observation /tele  IVF    Enteric precautions    Stool culture Blood cultures and repeat CBC in am  - Clear liquid diet and advance as tolerated    Add Probiotics CRP and ESR GI to see, appreciate consult   Urine  cultures in view of bacteria in urine which may reflect contamination    Hypertension BP (!) 146/51   Pulse (!) 50  Continue home anti-hypertensive medications  With HCTZ in am   Depression Continue Zoloft   DVT prophylaxis: Lovenox   Code Status:   Full     Family Communication:  Discussed with patient Disposition Plan: Expect patient to be discharged to home after condition improves Consults called:    GI  Admission status:Tele  Obs      , E, PA-C [facility]   06/06/2016, 11:43 AM

## 2016-06-06 NOTE — Op Note (Addendum)
[facility] Patient Name: Cassidy [name] Procedure Date : 06/06/2016 MRN: [id number] Attending MD: Bernette Redbird , MD Date of Birth: 05/08/1934 CSN: [id number] Age: 82 Admit Type: Inpatient Procedure:                Colonoscopy Indications:              Last colonoscopy: date unknown (unable to locate                            last colonoscopy report), Chronic diarrhea Providers:                Bernette Redbird, MD, Dwain Sarna, RN, Oletha Blend, Technician Referring MD:              Medicines:                Propofol per Anesthesia Complications:            No immediate complications. Estimated Blood Loss:     Estimated blood loss was minimal. Procedure:                Pre-Anesthesia Assessment:                           - Prior to the procedure, a History and Physical                            was performed, and patient medications and                            allergies were reviewed. The patient's tolerance of                            previous anesthesia was also reviewed. The risks                            and benefits of the procedure and the sedation                            options and risks were discussed with the patient.                            All questions were answered, and informed consent                            was obtained. Prior Anticoagulants: The patient has                            taken no previous anticoagulant or antiplatelet                            agents. ASA Grade Assessment: II - A patient with  mild systemic disease. After reviewing the risks                            and benefits, the patient was deemed in                            satisfactory condition to undergo the procedure.                           - Prior to the procedure, a History and Physical                            was performed, and patient medications and                            allergies were  reviewed. The patient's tolerance of                            previous anesthesia was also reviewed. The risks                            and benefits of the procedure and the sedation                            options and risks were discussed with the patient.                            All questions were answered, and informed consent                            was obtained. Prior Anticoagulants: The patient has                            taken no previous anticoagulant or antiplatelet                            agents. ASA Grade Assessment: II - A patient with                            mild systemic disease. After reviewing the risks                            and benefits, the patient was deemed in                            satisfactory condition to undergo the procedure.                           After obtaining informed consent, the colonoscope                            was passed under direct vision. Throughout the  procedure, the patient's blood pressure, pulse, and                            oxygen saturations were monitored continuously. The                            EC-[id number] ([id number]) scope was introduced through                            the anus and advanced to the the terminal ileum.                            The colonoscopy was performed without difficulty.                            The patient tolerated the procedure well. The                            quality of the bowel preparation was good, even                            though no prep was administered for this exam.                            There was no formed stool whatsoever within the                            colonic lumen, despite the absence of a prep. The                            terminal ileum, ileocecal valve, appendiceal                            orifice, and rectum were photographed. Scope In: 2:35:00 PM Scope Out: 2:58:00 PM Scope Withdrawal Time: 0 hours 14  minutes 0 seconds  Total Procedure Duration: 0 hours 23 minutes 0 seconds  Findings:      Multiple medium-mouthed diverticula were found in the sigmoid colon and       descending colon.      No other significant abnormalities were identified in a careful       examination of the remainder of the colon.      Biopsies for histology were taken with a cold forceps from the entire       colon for evaluation of microscopic colitis.      The terminal ileum appeared normal. Biopsies were taken with a cold       forceps for histology.      The retroflexed view of the distal rectum and anal verge was normal and       showed no anal or rectal abnormalities. Impression:               - Diverticulosis in the sigmoid colon and in the                            descending colon.                           -  The examined portion of the ileum was normal.                            Biopsied.                           - The distal rectum and anal verge are normal on                            retroflexion view.                           - No overt colitis, pseudomembranes, or other cause                            for patient's diarrhea endoscopically evident.                           - Biopsies were taken with a cold forceps from the                            entire colon (except the rectum) for evaluation of                            microscopic colitis. Moderate Sedation:      This patient was sedated with monitored anesthesia care, not moderate       sedation. Recommendation:           - Await pathology results.                           - Resume regular diet.                           - Repeat colonoscopy is not recommended for                            screening purposes.                           - Continue present medications. Procedure Code(s):        --- Professional ---                           [phone], Colonoscopy, flexible; with biopsy, single                            or  multiple Diagnosis Code(s):        --- Professional ---                           K52.9, Noninfective gastroenteritis and colitis,                            unspecified CPT copyright 2016 [employer]. All rights reserved. The codes documented in this report are preliminary and upon coder review may  be revised to meet  current compliance requirements. Bernette Redbirdobert Romond Pipkins, MD 06/06/2016 3:19:28 PM This report has been signed electronically. Number of Addenda: 0

## 2016-06-06 NOTE — ED Notes (Addendum)
Pt is ambulatory and normally takes care of herself. While here pt is refusing to clean herself and is using adult diapers and expecting staff to clean her up. Pt refuses to ambulate to restroom even with staff assistance, wheelchair, walker or bedside commode.

## 2016-06-06 NOTE — Consult Note (Signed)
Referring Provider:  Dr. Shelly Flattenavid Merrell Primary Care Physician:  No primary care provider on file. Primary Gastroenterologist: None (unassigned)  Reason for Consultation:  Abdominal pain, abnormal gastric appearance on CT scan, diarrhea  HPI: Cassidy [name] is a 82 y.o. female with mental health problems but no overt dementia, being admitted through the emergency room from an assisted living facility where she [facility]) because of diffuse abdominal pain, which prompted a CT scan the emergency room showing severe antral thickening suggestive of possible malignancy. In that regard, the patient has a good appetite and has gained weight over the past year. She does have diffuse abdominal pain and a several month history of diarrhea, multiple bowel movements per day, no blood, no obvious cramps, CT showing fluid filled loops of colon.   Past Medical History:  Diagnosis Date  . Arthritis   . HTN (hypertension)     Past Surgical History:  Procedure Laterality Date  . ABDOMINAL HYSTERECTOMY    . TOTAL KNEE ARTHROPLASTY    . TUBAL LIGATION      Prior to Admission medications   Medication Sig Start Date End Date Taking? Authorizing Provider  lactobacillus acidophilus (BACID) TABS tablet Take 1 tablet by mouth daily. For 30 days 05/09/16 06/07/16 Yes Historical Provider, MD  meclizine (ANTIVERT) 25 MG tablet Take 1 tablet (25 mg total) by mouth 3 (three) times daily as needed for dizziness. 05/12/16  Yes Richarda OverlieNayana Abrol, MD  meloxicam (MOBIC) 15 MG tablet Take 15 mg by mouth daily.   Yes Historical Provider, MD  Multiple Vitamin (MULTIVITAMIN WITH MINERALS) TABS tablet Take 1 tablet by mouth daily.   Yes Historical Provider, MD  sertraline (ZOLOFT) 25 MG tablet Take 25 mg by mouth at bedtime. 05/09/16  Yes Historical Provider, MD  traZODone (DESYREL) 50 MG tablet Take 0.5 tablets (25 mg total) by mouth at bedtime as needed for sleep. 05/12/16  Yes Richarda OverlieNayana Abrol, MD   triamterene-hydrochlorothiazide (MAXZIDE-25) 37.5-25 MG tablet Take 1 tablet by mouth daily.   Yes Historical Provider, MD    Current Facility-Administered Medications  Medication Dose Route Frequency Provider Last Rate Last Dose  . 0.9 %  sodium chloride infusion   Intravenous Continuous Marcos EkeSara E Wertman, PA-C      . acetaminophen (TYLENOL) tablet 650 mg  650 mg Oral Q6H PRN Marcos EkeSara E Wertman, PA-C       Or  . acetaminophen (TYLENOL) suppository 650 mg  650 mg Rectal Q6H PRN Marcos EkeSara E Wertman, PA-C      . enoxaparin (LOVENOX) injection 40 mg  40 mg Subcutaneous Q24H Marcos EkeSara E Wertman, PA-C      . HYDROcodone-acetaminophen (NORCO/VICODIN) 5-325 MG per tablet 1-2 tablet  1-2 tablet Oral Q4H PRN Marcos EkeSara E Wertman, PA-C      . lactobacillus acidophilus (BACID) tablet 2 tablet  2 tablet Oral TID Marcos EkeSara E Wertman, PA-C      . meclizine (ANTIVERT) tablet 25 mg  25 mg Oral TID PRN Marcos EkeSara E Wertman, PA-C      . meloxicam [facility](MOBIC) tablet 15 mg  15 mg Oral Daily Marcos EkeSara E Wertman, PA-C      . ondansetron [facility](ZOFRAN) tablet 4 mg  4 mg Oral Q6H PRN Marcos EkeSara E Wertman, PA-C       Or  . ondansetron [facility](ZOFRAN) injection 4 mg  4 mg Intravenous Q6H PRN Marcos EkeSara E Wertman, PA-C      . senna-docusate (Senokot-S) tablet 1 tablet  1 tablet Oral QHS PRN Marcos EkeSara E Wertman,  PA-C      . sertraline (ZOLOFT) tablet 25 mg  25 mg Oral QHS Sung AmabileSara E Wertman, PA-C      . sodium chloride flush (NS) 0.9 % injection 3 mL  3 mL Intravenous Q12H Marcos EkeSara E Wertman, PA-C      . traZODone (DESYREL) tablet 25 mg  25 mg Oral QHS PRN Marcos EkeSara E Wertman, PA-C      . Melene Muller[START ON 06/07/2016] triamterene-hydrochlorothiazide (MAXZIDE-25) 37.5-25 MG per tablet 1 tablet  1 tablet Oral Daily Marcos EkeSara E Wertman, PA-C        Allergies as of 06/06/2016 - Review Complete 06/06/2016  Allergen Reaction Noted  . Ciprofloxacin hcl Other (See Comments) 09/14/2015  . Prednisone Other (See Comments) 09/14/2015  . Zantac [ranitidine] Other (See Comments) 09/14/2015    Family History  Problem  Relation Age of Onset  . Adopted: Yes    Social History   Social History  . Marital status: Married    Spouse name: N/A  . Number of children: N/A  . Years of education: N/A   Occupational History  . Not on file.   Social History Main Topics  . Smoking status: Former Smoker    Packs/day: 1.00    Years: 10.00    Types: Cigarettes    Quit date: 05/01/2016  . Smokeless tobacco: Never Used  . Alcohol use No  . Drug use: No  . Sexual activity: Not on file   Other Topics Concern  . Not on file   Social History Narrative  . No narrative on file    Review of Systems: Negative for chronic headache or focal neurologic symptoms, dysphagia, reflux, rectal bleeding, exertional chest pain, breathing difficulties, enlarged lymph nodes, skin rashes, joint effusions (has minimal recurrent discomfort in the knee that had knee replacement 20 years ago), urinary difficulties  Physical Exam: Vital signs in last 24 hours: Temp:  [97.4 F (36.3 C)-98.3 F (36.8 C)] 98.3 F (36.8 C) (11/15 1256) Pulse Rate:  [50-79] 79 (11/15 1256) Resp:  [10-18] 17 (11/15 1256) BP: (107-163)/(39-99) 127/45 (11/15 1256) SpO2:  [93 %-100 %] 100 % (11/15 1256) Weight:  [59 kg (130 lb)] 59 kg (130 lb) (11/15 0455)   General:   Alert,  Well-developed, well-nourished, pleasant and cooperative in NAD Head:  Normocephalic and atraumatic. Eyes:  Sclera clear, no icterus.   Conjunctiva pale. Mouth:   No ulcerations or lesions.  Oropharynx pink & moist. Neck:   No masses or thyromegaly. Lungs:  Clear throughout to auscultation.   No wheezes, crackles, or rhonchi. No evident respiratory distress. Heart:   Regular rate and rhythm; no murmurs, clicks, rubs,  or gallops. Abdomen:  Nondistended, moderately tympanitic, rather mild active bowel sounds which are not really obstructive in character, no palpable hepatosplenomegaly or evident ascites, no mass effect, guarding, tenderness, or peritoneal findings Msk:    Symmetrical without gross deformities. Pulses:  Normal radial pulse is noted. Extremities:   Without clubbing, cyanosis, or edema. Neurologic:  No overt focal deficits. Oriented to date of birth, and fairly closely to today's date, oriented to location and situation. Skin:  Intact without significant lesions or rashes. Cervical Nodes:  No significant cervical adenopathy. Psych:   Alert and cooperative. Normal mood and affect.  Intake/Output from previous day: No intake/output data recorded. Intake/Output this shift: Total I/O In: 500 [IV Piggyback:500] Out: -   Lab Results:  Recent Labs  06/06/16 0459  WBC 12.7*  HGB 13.5  HCT 39.9  PLT 338  BMET  Recent Labs  06/06/16 0459  NA 139  K 5.0  CL 105  CO2 24  GLUCOSE 99  BUN 19  CREATININE 0.98  CALCIUM 9.9   LFT  Recent Labs  06/06/16 0459  PROT 6.9  ALBUMIN 3.9  AST 19  ALT 16  ALKPHOS 64  BILITOT 0.6   PT/INR No results for input(s): LABPROT, INR in the last 72 hours.  Studies/Results: Ct Abdomen Pelvis W Contrast  Result Date: 06/06/2016 CLINICAL DATA:  Constant diarrhea for 3 months EXAM: CT ABDOMEN AND PELVIS WITH CONTRAST TECHNIQUE: Multidetector CT imaging of the abdomen and pelvis was performed using the standard protocol following bolus administration of intravenous contrast. CONTRAST:  1 ISOVUE-300 IOPAMIDOL (ISOVUE-300) INJECTION 61% COMPARISON:  None. FINDINGS: Lower chest: Lung bases are clear. Hepatobiliary: No focal liver abnormality is seen. No gallstones, gallbladder wall thickening, or biliary dilatation. Pancreas: Unremarkable. No pancreatic ductal dilatation or surrounding inflammatory changes. Spleen: Normal in size without focal abnormality. Adrenals/Urinary Tract: Adrenal glands are unremarkable. Kidneys are normal, without renal calculi, focal lesion, or hydronephrosis. Bladder is unremarkable. Stomach/Bowel: Focal severe thickening of the antrum of the stomach. Air-fluid levels  throughout the colon consistent with diarrhea. No pneumatosis, pneumoperitoneum or portal venous gas. Diverticulosis of the sigmoid colon without evidence of diverticulitis. Vascular/Lymphatic: Normal caliber abdominal aorta with atherosclerosis. No lymphadenopathy. Reproductive: Status post hysterectomy. No adnexal masses. Other: No abdominal wall hernia or abnormality. No abdominopelvic ascites. Musculoskeletal: No lytic or sclerotic osseous lesion. Degenerative disc disease with disc height loss at L5-S1 with bilateral facet arthropathy. Bilateral foraminal narrowing at L5-S1. IMPRESSION: 1. Focal severe thickening of the antrum of the stomach. The appearance is concerning for malignancy versus severe gastritis. Recommend further evaluation with upper endoscopy. 2. Air-fluid levels throughout the colon consistent with diarrhea. Electronically Signed   By: Elige Ko   On: 06/06/2016 09:37    Impression: 1. Nonspecific abdominal pain with CT negative for overt acute problems to explain it. Minimal leukocytosis, without fever, of uncertain clinical significance 2. Abnormal CT appearance of stomach, but without symptoms or signs to suggest gastric malignancy, such as anorexia, weight loss or anemia 3. History of diarrhea, with fluid in the colon which could correlate with that finding.  Plan: 1. Endoscopy today. Ashby Dawes, purpose, risks reviewed. Anticipate duodenal biopsies as part of that evaluation, in view of patient's diarrhea. We will also of course biopsy any gastric mass that may be present, as suggested on today's CT scan. 2. Unprepped (partial?) Colonoscopy to obtain random mucosal biopsies looking for evidence of colitis to explain patient's diarrhea and possibly her abdominal pain as well.   LOS: 0 days   Witt Plitt V  06/06/2016, 1:00 PM   Pager [phone] If no answer or after 5 PM call [phone]

## 2016-06-06 NOTE — Anesthesia Postprocedure Evaluation (Signed)
Anesthesia Post Note  Patient: Cassidy [name]  Procedure(s) Performed: Procedure(s) (LRB): ESOPHAGOGASTRODUODENOSCOPY (EGD) WITH PROPOFOL (N/A) COLONOSCOPY WITH PROPOFOL (N/A)  Patient location during evaluation: PACU Anesthesia Type: MAC Level of consciousness: awake and alert and oriented Pain management: pain level controlled Vital Signs Assessment: post-procedure vital signs reviewed and stable Respiratory status: spontaneous breathing, nonlabored ventilation and respiratory function stable Cardiovascular status: stable and blood pressure returned to baseline Postop Assessment: no signs of nausea or vomiting Anesthetic complications: no    Last Vitals:  Vitals:   06/06/16 1535 06/06/16 1545  BP: (!) 134/41 (!) 116/35  Pulse: 60 60  Resp: (!) 0 (!) 8  Temp:      Last Pain:  Vitals:   06/06/16 1344  TempSrc: Oral  PainSc:                  [name] A.

## 2016-06-06 NOTE — Anesthesia Preprocedure Evaluation (Signed)
Anesthesia Evaluation  Patient identified by MRN, date of birth, ID band Patient awake    Reviewed: Allergy & Precautions, NPO status , Patient's Chart, lab work & pertinent test results  Airway Mallampati: II  TM Distance: >3 FB Neck ROM: Full    Dental  (+) Poor Dentition   Pulmonary former smoker,    Pulmonary exam normal breath sounds clear to auscultation       Cardiovascular hypertension, Pt. on medications Normal cardiovascular exam Rhythm:Regular Rate:Normal     Neuro/Psych PSYCHIATRIC DISORDERS Adjustment disordernegative neurological ROS     GI/Hepatic Neg liver ROS, Abnormal CT scan Diarrhea   Endo/Other  negative endocrine ROS  Renal/GU negative Renal ROS  negative genitourinary   Musculoskeletal  (+) Arthritis , Osteoarthritis,    Abdominal   Peds  Hematology negative hematology ROS (+)   Anesthesia Other Findings   Reproductive/Obstetrics                             Anesthesia Physical Anesthesia Plan  ASA: II  Anesthesia Plan: MAC   Post-op Pain Management:    Induction: Intravenous  Airway Management Planned: Natural Airway, Simple Face Mask and Nasal Cannula  Additional Equipment:   Intra-op Plan:   Post-operative Plan:   Informed Consent: I have reviewed the patients History and Physical, chart, labs and discussed the procedure including the risks, benefits and alternatives for the proposed anesthesia with the patient or authorized representative who has indicated his/her understanding and acceptance.     Plan Discussed with: Anesthesiologist, CRNA and Surgeon  Anesthesia Plan Comments:         Anesthesia Quick Evaluation

## 2016-06-06 NOTE — Progress Notes (Signed)
Report called to delcine, rn

## 2016-06-07 ENCOUNTER — Encounter (HOSPITAL_COMMUNITY): Payer: Self-pay | Admitting: Gastroenterology

## 2016-06-07 DIAGNOSIS — R197 Diarrhea, unspecified: Secondary | ICD-10-CM | POA: Diagnosis not present

## 2016-06-07 DIAGNOSIS — F4329 Adjustment disorder with other symptoms: Secondary | ICD-10-CM | POA: Diagnosis not present

## 2016-06-07 DIAGNOSIS — K259 Gastric ulcer, unspecified as acute or chronic, without hemorrhage or perforation: Secondary | ICD-10-CM | POA: Diagnosis not present

## 2016-06-07 DIAGNOSIS — R109 Unspecified abdominal pain: Secondary | ICD-10-CM | POA: Diagnosis not present

## 2016-06-07 DIAGNOSIS — D72829 Elevated white blood cell count, unspecified: Secondary | ICD-10-CM | POA: Diagnosis not present

## 2016-06-07 LAB — URINE CULTURE

## 2016-06-07 LAB — CBC
HEMATOCRIT: 36.1 % (ref 36.0–46.0)
Hemoglobin: 11.8 g/dL — ABNORMAL LOW (ref 12.0–15.0)
MCH: 30.9 pg (ref 26.0–34.0)
MCHC: 32.7 g/dL (ref 30.0–36.0)
MCV: 94.5 fL (ref 78.0–100.0)
PLATELETS: 291 10*3/uL (ref 150–400)
RBC: 3.82 MIL/uL — AB (ref 3.87–5.11)
RDW: 12.7 % (ref 11.5–15.5)
WBC: 9.6 10*3/uL (ref 4.0–10.5)

## 2016-06-07 LAB — COMPREHENSIVE METABOLIC PANEL
ALT: 12 U/L — AB (ref 14–54)
AST: 15 U/L (ref 15–41)
Albumin: 3 g/dL — ABNORMAL LOW (ref 3.5–5.0)
Alkaline Phosphatase: 55 U/L (ref 38–126)
Anion gap: 7 (ref 5–15)
BUN: 13 mg/dL (ref 6–20)
CHLORIDE: 110 mmol/L (ref 101–111)
CO2: 22 mmol/L (ref 22–32)
CREATININE: 0.85 mg/dL (ref 0.44–1.00)
Calcium: 8.9 mg/dL (ref 8.9–10.3)
Glucose, Bld: 83 mg/dL (ref 65–99)
POTASSIUM: 4.2 mmol/L (ref 3.5–5.1)
SODIUM: 139 mmol/L (ref 135–145)
Total Bilirubin: 0.7 mg/dL (ref 0.3–1.2)
Total Protein: 5.7 g/dL — ABNORMAL LOW (ref 6.5–8.1)

## 2016-06-07 LAB — GLUCOSE, CAPILLARY
GLUCOSE-CAPILLARY: 149 mg/dL — AB (ref 65–99)
GLUCOSE-CAPILLARY: 120 mg/dL — AB (ref 65–99)
Glucose-Capillary: 104 mg/dL — ABNORMAL HIGH (ref 65–99)
Glucose-Capillary: 76 mg/dL (ref 65–99)
Glucose-Capillary: 111 mg/dL — ABNORMAL HIGH (ref 65–99)

## 2016-06-07 NOTE — Progress Notes (Signed)
PROGRESS NOTE    Cassidy [name]  UJW:[id number] DOB: 05/29/1934 DOA: 06/06/2016 PCP: No primary care provider on file.   Chief Complaint  Patient presents with  . Abdominal Pain  . Diarrhea    Brief Narrative:  HPI on 06/06/2016 by Ms. Marlowe KaysSara Wertman, PA Cassidy BeardDolores R [name] is a 82 y.o. female  Recently hospitalized on 10/19-10/21 for dizziness and chest pain with essentially negative workup, now presenting with 1 day history of intractable abdominal pain, nausea and severe diarrhea, with 5 stools at the ED. She reports having chronic lose stools but these were "different" watery.  denies any food poisoning, bloody stools or emesis. Abdominal pain is dull, 3/4, radiating across the lower abdomen. Denies any sick contacts. He denies any fever, chills or night sweats. No chest pain or shortness of breath.  Denies any dizziness or vertigo. He denies any rashes or cuts.Denies any myalgias. No recent antibiotics or changes in her meds. She reports living in a shelter, but it appears per records that she lives in [facility] assisted [facility] . Denies tobacco or ETOH. Denies dysuria or hematuria, but does report frequency. No confusion is noted.  Assessment & Plan   Intractable abdominal pain/Gastric Ulcer -Gastroneterology consulted and appreciated -s/p EGD: Normal esophagus, nonbleeding gastric ulcer with pigmented material, erythematous duodenopathy -S/p Colonoscopy: Diverticulosis in the sigmoid and descending colon. No overt colitis, pseudomembranous, other constipation diarrhea  -Continue PPI, sucralfate -Meloxicam discontinued -Biopsies from colonoscopy and EGD pending -Diet advanced  Acute on chronic diarrhea -Seems to have resolved, unclear etiology  -biopsies pending -Stool pathogen panel negative -Will continue to monitor  Essential hypertension -Continue maxzide  Depression -Continue zoloft  Generalized weakness -PT OT consulted  DVT Prophylaxis  Lovenox  Code Status:  Full  Family Communication: None at bedside  Disposition Plan: In observation. Likely discharge on 11/17  Consultants Gastroenterology  Procedures  EGD Colonoscopy  Antibiotics   Anti-infectives    None      Subjective:   Cassidy [name] seen and examined today. She states she feels weak this morning. Denies any chest pain, shortness of breath, current abdominal pain, nausea or vomiting. Denies any further diarrhea. States she was shelter which she does not return to.  Objective:   Vitals:   06/06/16 2120 06/07/16 0523 06/07/16 0652 06/07/16 0655  BP: (!) 109/38 (!) 92/34  (!) 116/33  Pulse: 65 (!) 59  (!) 55  Resp: 18 19    Temp: 98.5 F (36.9 C) 97.8 F (36.6 C)    TempSrc: Oral Oral    SpO2: 98% 98%    Weight:   58.3 kg (128 lb 9.6 oz)   Height:        Intake/Output Summary (Last 24 hours) at 06/07/16 1259 Last data filed at 06/07/16 1026  Gross per 24 hour  Intake              930 ml  Output             1201 ml  Net             -271 ml   Filed Weights   06/06/16 0455 06/07/16 0652  Weight: 59 kg (130 lb) 58.3 kg (128 lb 9.6 oz)    Exam  General: Well developed, well nourished, NAD, appears stated age  HEENT: NCAT, mucous membranes moist.   Cardiovascular: S1 S2 auscultated, no rubs, murmurs or gallops. Regular rate and rhythm.  Respiratory: Clear to auscultation bilaterally with equal chest rise  Abdomen: Soft, nontender, nondistended, + bowel sounds  Extremities: warm dry without cyanosis clubbing or edema  Neuro: AAOx3, nonfocal  Psych: Normal affect and demeanor    Data Reviewed: I have personally reviewed following labs and imaging studies  CBC:  Recent Labs Lab 06/06/16 0459 06/07/16 0548  WBC 12.7* 9.6  HGB 13.5 11.8*  HCT 39.9 36.1  MCV 93.9 94.5  PLT 338 291   Basic Metabolic Panel:  Recent Labs Lab 06/06/16 0459 06/07/16 0548  NA 139 139  K 5.0 4.2  CL 105 110  CO2 24 22  GLUCOSE 99 83  BUN 19 13  CREATININE  0.98 0.85  CALCIUM 9.9 8.9   GFR: Estimated Creatinine Clearance: 43.7 mL/min (by C-G formula based on SCr of 0.85 mg/dL). Liver Function Tests:  Recent Labs Lab 06/06/16 0459 06/07/16 0548  AST 19 15  ALT 16 12*  ALKPHOS 64 55  BILITOT 0.6 0.7  PROT 6.9 5.7*  ALBUMIN 3.9 3.0*    Recent Labs Lab 06/06/16 0459  LIPASE 63*   No results for input(s): AMMONIA in the last 168 hours. Coagulation Profile: No results for input(s): INR, PROTIME in the last 168 hours. Cardiac Enzymes: No results for input(s): CKTOTAL, CKMB, CKMBINDEX, TROPONINI in the last 168 hours. BNP (last 3 results) No results for input(s): PROBNP in the last 8760 hours. HbA1C: No results for input(s): HGBA1C in the last 72 hours. CBG:  Recent Labs Lab 06/07/16 0221 06/07/16 0750 06/07/16 1153  GLUCAP 104* 76 149*   Lipid Profile: No results for input(s): CHOL, HDL, LDLCALC, TRIG, CHOLHDL, LDLDIRECT in the last 72 hours. Thyroid Function Tests: No results for input(s): TSH, T4TOTAL, FREET4, T3FREE, THYROIDAB in the last 72 hours. Anemia Panel: No results for input(s): VITAMINB12, FOLATE, FERRITIN, TIBC, IRON, RETICCTPCT in the last 72 hours. Urine analysis:    Component Value Date/Time   COLORURINE YELLOW 06/06/2016 0512   APPEARANCEUR CLEAR 06/06/2016 0512   LABSPEC 1.016 06/06/2016 0512   PHURINE 6.0 06/06/2016 0512   GLUCOSEU NEGATIVE 06/06/2016 0512   HGBUR MODERATE (A) 06/06/2016 0512   BILIRUBINUR NEGATIVE 06/06/2016 0512   KETONESUR NEGATIVE 06/06/2016 0512   PROTEINUR NEGATIVE 06/06/2016 0512   NITRITE NEGATIVE 06/06/2016 0512   LEUKOCYTESUR NEGATIVE 06/06/2016 0512   Sepsis Labs: @LABRCNTIP (procalcitonin:4,lacticidven:4)  ) Recent Results (from the past 240 hour(s))  Urine culture     Status: Abnormal   Collection Time: 06/06/16  4:58 AM  Result Value Ref Range Status   Specimen Description URINE, RANDOM  Final   Special Requests NONE  Final   Culture MULTIPLE SPECIES  PRESENT, SUGGEST RECOLLECTION (A)  Final   Report Status 06/07/2016 FINAL  Final  Gastrointestinal Panel by PCR , Stool     Status: None   Collection Time: 06/06/16  7:30 AM  Result Value Ref Range Status   Campylobacter species NOT DETECTED NOT DETECTED Final   Plesimonas shigelloides NOT DETECTED NOT DETECTED Final   Salmonella species NOT DETECTED NOT DETECTED Final   Yersinia enterocolitica NOT DETECTED NOT DETECTED Final   Vibrio species NOT DETECTED NOT DETECTED Final   Vibrio cholerae NOT DETECTED NOT DETECTED Final   Enteroaggregative E coli (EAEC) NOT DETECTED NOT DETECTED Final   Enteropathogenic E coli (EPEC) NOT DETECTED NOT DETECTED Final   Enterotoxigenic E coli (ETEC) NOT DETECTED NOT DETECTED Final   Shiga like toxin producing E coli (STEC) NOT DETECTED NOT DETECTED Final   Shigella/Enteroinvasive E coli (EIEC) NOT DETECTED NOT DETECTED Final  Cryptosporidium NOT DETECTED NOT DETECTED Final   Cyclospora cayetanensis NOT DETECTED NOT DETECTED Final   Entamoeba histolytica NOT DETECTED NOT DETECTED Final   Giardia lamblia NOT DETECTED NOT DETECTED Final   Adenovirus F40/41 NOT DETECTED NOT DETECTED Final   Astrovirus NOT DETECTED NOT DETECTED Final   Norovirus GI/GII NOT DETECTED NOT DETECTED Final   Rotavirus A NOT DETECTED NOT DETECTED Final   Sapovirus (I, II, IV, and V) NOT DETECTED NOT DETECTED Final      Radiology Studies: Ct Abdomen Pelvis W Contrast  Result Date: 06/06/2016 CLINICAL DATA:  Constant diarrhea for 3 months EXAM: CT ABDOMEN AND PELVIS WITH CONTRAST TECHNIQUE: Multidetector CT imaging of the abdomen and pelvis was performed using the standard protocol following bolus administration of intravenous contrast. CONTRAST:  1 ISOVUE-300 IOPAMIDOL (ISOVUE-300) INJECTION 61% COMPARISON:  None. FINDINGS: Lower chest: Lung bases are clear. Hepatobiliary: No focal liver abnormality is seen. No gallstones, gallbladder wall thickening, or biliary dilatation.  Pancreas: Unremarkable. No pancreatic ductal dilatation or surrounding inflammatory changes. Spleen: Normal in size without focal abnormality. Adrenals/Urinary Tract: Adrenal glands are unremarkable. Kidneys are normal, without renal calculi, focal lesion, or hydronephrosis. Bladder is unremarkable. Stomach/Bowel: Focal severe thickening of the antrum of the stomach. Air-fluid levels throughout the colon consistent with diarrhea. No pneumatosis, pneumoperitoneum or portal venous gas. Diverticulosis of the sigmoid colon without evidence of diverticulitis. Vascular/Lymphatic: Normal caliber abdominal aorta with atherosclerosis. No lymphadenopathy. Reproductive: Status post hysterectomy. No adnexal masses. Other: No abdominal wall hernia or abnormality. No abdominopelvic ascites. Musculoskeletal: No lytic or sclerotic osseous lesion. Degenerative disc disease with disc height loss at L5-S1 with bilateral facet arthropathy. Bilateral foraminal narrowing at L5-S1. IMPRESSION: 1. Focal severe thickening of the antrum of the stomach. The appearance is concerning for malignancy versus severe gastritis. Recommend further evaluation with upper endoscopy. 2. Air-fluid levels throughout the colon consistent with diarrhea. Electronically Signed   By: Elige Ko   On: 06/06/2016 09:37     Scheduled Meds: . enoxaparin (LOVENOX) injection  40 mg Subcutaneous Q24H  . lactobacillus acidophilus  2 tablet Oral TID  . pantoprazole  40 mg Oral BID AC  . pneumococcal 23 valent vaccine  0.5 mL Intramuscular Tomorrow-1000  . sertraline  25 mg Oral QHS  . sodium chloride flush  3 mL Intravenous Q12H  . sucralfate  1 g Oral QID  . triamterene-hydrochlorothiazide  1 tablet Oral Daily   Continuous Infusions: . sodium chloride 100 mL/hr at 06/07/16 0959     LOS: 0 days   Time Spent in minutes   30 minutes  Jered Heiny D.O. on 06/07/2016 at 12:59 PM  Between 7am to 7pm - Pager - [phone]  After 7pm go to  [email or web address] - password [email or web address]  And look for the night coverage person covering for me after hours  [facility] Office  [phone]

## 2016-06-07 NOTE — Progress Notes (Signed)
Minor drop in hemoglobin noted.  Patient states that she is not really having abdominal pain now, and is tolerating food although she is not eating that much.  Diarrhea has essentially resolved.  GI pathogen panel came back negative.  Biopsies from yesterday's endoscopy and colonoscopy are still pending.  Impression:  1. Deep gastric ulcer 2. Abdominal pain, probably secondary to #1 3. Diarrhea of unclear cause, apparently quiescent at the moment, random biopsies pending  Plan:  1. Okay for discharge from GI tract standpoint at any time. At your discretion, you might want to watch her 1 more day in the hospital to look for recurrence of diarrhea while eating solid food.  2. I will follow up with the patient when her biopsy results become available.  Florencia Reasonsobert V. Steffanie Mingle, M.D. Pager [phone] If no answer or after 5 PM call [phone]

## 2016-06-07 NOTE — Evaluation (Signed)
Occupational Therapy Evaluation and Discharge Patient Details Name: Cassidy [name] MRN: [id number] DOB: 09/02/1933 Today's Date: 06/07/2016    History of Present Illness 82 y.o.femaleRecently hospitalized on 10/19-10/21 for dizziness and chest pain with essentially negative workup, now presenting with 1 day history of intractable abdominal pain, nausea and severe diarrhea, with 5 stools at the ED. PMHx: Arthritis, HTN, TKA.   Clinical Impression   Pt reports she was independent with BADL PTA and used a rollator for mobility. Currently pt overall supervision for ADL and functional mobility without use of RW. Pt reports she lives at a "shelter" but confirms that [facility]; where she plans to return upon d/c. Feel pt is currently at or close to her functional baseline. No further acute OT needs identified; signing off at this time. Please re-consult if needs change. Thank you for this referral.     Follow Up Recommendations  No OT follow up    Equipment Recommendations  None recommended by OT    Recommendations for Other Services       Precautions / Restrictions Precautions Precautions: Fall Restrictions Weight Bearing Restrictions: No      Mobility Bed Mobility Overal bed mobility: Modified Independent             General bed mobility comments: Increased time but no physical assist. HOB flat without use of bed rails.  Transfers Overall transfer level: Needs assistance Equipment used: None Transfers: Sit to/from Stand Sit to Stand: Supervision         General transfer comment: Supervision for safety due to no RW present in room. Mild unsteadiness but no major LOB.    Balance Overall balance assessment: Needs assistance Sitting-balance support: Feet supported;No upper extremity supported Sitting balance-Leahy Scale: Normal     Standing balance support: No upper extremity supported;During functional activity Standing balance-Leahy  Scale: Good                              ADL Overall ADL's : Needs assistance/impaired Eating/Feeding: Independent;Sitting   Grooming: Supervision/safety;Wash/dry hands;Standing   Upper Body Bathing: Supervision/ safety;Sitting   Lower Body Bathing: Supervison/ safety;Sit to/from stand   Upper Body Dressing : Supervision/safety;Sitting   Lower Body Dressing: Supervision/safety;Sit to/from stand   Toilet Transfer: Supervision/safety;Ambulation;Regular Toilet;Grab bars   Toileting- Clothing Manipulation and Hygiene: Supervision/safety;Sit to/from stand       Functional mobility during ADLs: Supervision/safety (holding IV pole) General ADL Comments: Pt able to complete ADL and functional mobility tasks at a supervision level without use of RW. Discussed use of RW at home for independence and safety with ADL and functional mobility; pt verbalized understanding.     Vision Vision Assessment?: No apparent visual deficits   Perception     Praxis      Pertinent Vitals/Pain Pain Assessment: No/denies pain     Hand Dominance Right   Extremity/Trunk Assessment Upper Extremity Assessment Upper Extremity Assessment: Overall WFL for tasks assessed   Lower Extremity Assessment Lower Extremity Assessment: Generalized weakness       Communication Communication Communication: No difficulties   Cognition Arousal/Alertness: Awake/alert Behavior During Therapy: WFL for tasks assessed/performed Overall Cognitive Status: Within Functional Limits for tasks assessed                     General Comments       Exercises       Shoulder Instructions  Home Living Family/patient expects to be discharged to:: Assisted living                             Home Equipment: Walker - 4 wheels;Shower seat - built in;Grab bars - toilet;Grab bars - tub/shower   Additional Comments: [facility]. Has her own room/bathroom but takes meals  at dining hall.      Prior Functioning/Environment Level of Independence: Independent with assistive device(s)        Comments: Rollator for mobility. Completes BADL independently. Meals provided by ALF in dining hall.        OT Problem List:     OT Treatment/Interventions:      OT Goals(Current goals can be found in the care plan section) Acute Rehab OT Goals OT Goal Formulation: All assessment and education complete, DC therapy  OT Frequency:     Barriers to D/C:            Co-evaluation              End of Session Nurse Communication: Mobility status  Activity Tolerance: Patient tolerated treatment well Patient left: in chair;with call bell/phone within reach;with chair alarm set   Time: [phone] OT Time Calculation (min): 29 min Charges:  OT General Charges $OT Visit: 1 Procedure OT Evaluation $OT Eval Moderate Complexity: 1 Procedure OT Treatments $Self Care/Home Management : 8-22 mins G-Codes: OT G-codes **NOT FOR INPATIENT CLASS** Functional Assessment Tool Used: Clinical judgement Functional Limitation: Self care Self Care Current Status (W1191(G8987): At least 1 percent but less than 20 percent impaired, limited or restricted Self Care Goal Status (Y7829(G8988): At least 1 percent but less than 20 percent impaired, limited or restricted Self Care Discharge Status [phone](G8989): At least 1 percent but less than 20 percent impaired, limited or restricted   Cassidy AlkenBailey A Hyacinth [name] M.S., OTR/L Pager: [phone]  06/07/2016, 9:27 AM

## 2016-06-07 NOTE — Evaluation (Signed)
Physical Therapy Evaluation Patient Details Name: Cassidy BeardDolores R [name] MRN: [id number] DOB: 03/01/1934 Today's Date: 06/07/2016   History of Present Illness  82 y.o.femaleRecently hospitalized on 10/19-10/21 for dizziness and chest pain with essentially negative workup, now presenting with 1 day history of intractable abdominal pain, nausea and severe diarrhea, with 5 stools at the ED. PMHx: Arthritis, HTN, TKA.  Clinical Impression  The patient is mobilizing very well. BOP after ambu;ating x 400' 104/49. No c/o dizziness. Pt admitted with above diagnosis. Pt currently with functional limitations due to the deficits listed below (see PT Problem List).  Pt will benefit from skilled PT to increase their independence and safety with mobility to allow discharge to the venue listed below.       Follow Up Recommendations No PT follow up, resides in ALF.     Equipment Recommendations  None recommended by PT    Recommendations for Other Services       Precautions / Restrictions Precautions Precautions: Fall      Mobility  Bed Mobility Overal bed mobility: Modified Independent                Transfers Overall transfer level: Modified independent Equipment used: None   Sit to Stand: Modified independent (Device/Increase time)         General transfer comment: patient got herself up from the toilet and bed, no assistance  Ambulation/Gait Ambulation/Gait assistance: Supervision Ambulation Distance (Feet): 400 Feet Assistive device: Rolling walker (2 wheeled) Gait Pattern/deviations: Step-through pattern;WFL(Within Functional Limits)     General Gait Details: patient also ambulated in the room without RW, holding onto the IV pole  Stairs            Wheelchair Mobility    Modified Rankin (Stroke Patients Only)       Balance           Standing balance support: No upper extremity supported;During functional activity Standing balance-Leahy Scale: Good                               Pertinent Vitals/Pain Pain Assessment: No/denies pain    Home Living Family/patient expects to be discharged to:: Assisted living               Home Equipment: Walker - 4 wheels;Shower seat - built in;Grab bars - toilet;Grab bars - tub/shower Additional Comments: [facility]. Has her own room/bathroom but takes meals at dining hall.    Prior Function Level of Independence: Independent with assistive device(s)         Comments: Rollator for mobility. Completes BADL independently. Meals provided by ALF in dining hall.     Hand Dominance        Extremity/Trunk Assessment   Upper Extremity Assessment: Overall WFL for tasks assessed           Lower Extremity Assessment: Overall WFL for tasks assessed      Cervical / Trunk Assessment: Kyphotic  Communication   Communication: No difficulties  Cognition Arousal/Alertness: Awake/alert Behavior During Therapy: WFL for tasks assessed/performed Overall Cognitive Status: Within Functional Limits for tasks assessed                      General Comments      Exercises     Assessment/Plan    PT Assessment Patient needs continued PT services  PT Problem List Decreased activity tolerance;Decreased mobility  PT Treatment Interventions DME instruction;Gait training;Functional mobility training    PT Goals (Current goals can be found in the Care Plan section)  Acute Rehab PT Goals Patient Stated Goal: to feel stronger PT Goal Formulation: With patient Time For Goal Achievement: 06/21/16 Potential to Achieve Goals: Good    Frequency Min 3X/week   Barriers to discharge        Co-evaluation               End of Session   Activity Tolerance: Patient tolerated treatment well Patient left: in bed;with call bell/phone within reach;with bed alarm set Nurse Communication: Mobility status    Functional Assessment Tool Used: clinical  judgement Functional Limitation: Mobility: Walking and moving around Mobility: Walking and Moving Around Current Status (R6045(G8978): At least 1 percent but less than 20 percent impaired, limited or restricted Mobility: Walking and Moving Around Goal Status [phone](G8979): 0 percent impaired, limited or restricted    Time: [id number] PT Time Calculation (min) (ACUTE ONLY): 20 min   Charges:   PT Evaluation $PT Eval Low Complexity: 1 Procedure     PT G Codes:   PT G-Codes **NOT FOR INPATIENT CLASS** Functional Assessment Tool Used: clinical judgement Functional Limitation: Mobility: Walking and moving around Mobility: Walking and Moving Around Current Status (F6213(G8978): At least 1 percent but less than 20 percent impaired, limited or restricted Mobility: Walking and Moving Around Goal Status [phone](G8979): 0 percent impaired, limited or restricted    Rada HayHill, Nathaneal Sommers Elizabeth 06/07/2016, 4:35 PM Blanchard KelchKaren Marylin Lathon PT [phone]

## 2016-06-07 NOTE — Clinical Social Work Note (Signed)
Clinical Social Work Assessment  Patient Details  Name: Cassidy BeardDolores R Maggard MRN: [id number] Date of Birth: 09/19/1933  Date of referral:  06/07/16               Reason for consult:  Discharge Planning                Permission sought to share information with:  Oceanographeracility Contact Representative Permission granted to share information::  Yes, Verbal Permission Granted  Name::        Agency::  [name]  Relationship::     Contact Information:     Housing/Transportation Living arrangements for the past 2 months:  Assisted Living Facility Source of Information:  Patient, Facility Patient Interpreter Needed:  None Criminal Activity/Legal Involvement Pertinent to Current Situation/Hospitalization:  No - Comment as needed Significant Relationships:  None Lives with:  Facility Resident Do you feel safe going back to the place where you live?  No Need for family participation in patient care:  No (Coment)  Care giving concerns:  CSW received consult for discharge planning. [facility] She reports that it is a homeless shelter. Facility med tech states that patient has no family or friends. [facility] and report that she has complained of diarrhea for months. They have placed a psych consult and a GI consult.   Social Worker assessment / plan:  Patient will return to ALF at discharge.   Employment status:  Retired Database administratornsurance information:  Managed Medicare PT Recommendations:    Information / Referral to community resources:     Patient/Family's Response to care:  Patient does not want to return to ALF, but CSW explained that she does not have other options.  Patient/Family's Understanding of and Emotional Response to Diagnosis, Current Treatment, and Prognosis:  No questions/concerns at this time.  Emotional Assessment Appearance:  Appears stated age Attitude/Demeanor/Rapport:  Other, Complaining Affect (typically observed):   Irritable Orientation:  Oriented to Self, Oriented to Place, Oriented to  Time, Oriented to Situation Alcohol / Substance use:  Not Applicable Psych involvement (Current and /or in the community):  No (Comment)  Discharge Needs  Concerns to be addressed:  Care Coordination Readmission within the last 30 days:  Yes Current discharge risk:  None Barriers to Discharge:  Continued Medical Work up   [employer]ia S Braycen Burandt, LCSWA 06/07/2016, 2:54 PM

## 2016-06-07 NOTE — Care Management Obs Status (Signed)
MEDICARE OBSERVATION STATUS NOTIFICATION   Patient Details  Name: Cassidy [name] MRN: [id number] Date of Birth: 10/18/1933   Medicare Observation Status Notification Given:  Yes (obs letter explained, patient has no questions)    Antony HasteBennett, Maela Takeda [name], RN 06/07/2016, 6:40 PM

## 2016-06-07 NOTE — Care Management Note (Signed)
Case Management Note  Patient Details  Name: Cassidy [name] MRN: [id number] Date of Birth: 07/19/1934  Subjective/Objective:     Recently hospitalized on 10/19-10/21 for dizziness and chest pain with essentially negative workup, now presenting with 1 day history of intractable abdominal pain, nausea and severe diarrhea, with 5 stools in ED. [facility]. Independent with ALD's, and uses a walker with ambulation PTA.              [facility]   Action/Plan: Return to home/ALF when medically stable.... CM to f/u with d/c needs.  Expected Discharge Date:                  Expected Discharge Plan:  Assisted Living / [facility]  In-House Referral:  Clinical Social Work  Discharge planning Services  CM Consult  Post Acute Care Choice:    Choice offered to:     DME Arranged:    DME Agency:     HH Arranged:    HH Agency:     Status of Service:  In process, will continue to follow  If discussed at Long Length of Stay Meetings, dates discussed:    Additional Comments:  Epifanio LeschesCole, Robby Bulkley Hudson, RN 06/07/2016, 12:52 PM

## 2016-06-07 NOTE — Progress Notes (Signed)
Pt CBG 104 result not able to transferred into the computer

## 2016-06-08 DIAGNOSIS — R109 Unspecified abdominal pain: Secondary | ICD-10-CM | POA: Diagnosis not present

## 2016-06-08 DIAGNOSIS — F4329 Adjustment disorder with other symptoms: Secondary | ICD-10-CM | POA: Diagnosis not present

## 2016-06-08 DIAGNOSIS — R197 Diarrhea, unspecified: Secondary | ICD-10-CM | POA: Diagnosis not present

## 2016-06-08 DIAGNOSIS — K259 Gastric ulcer, unspecified as acute or chronic, without hemorrhage or perforation: Secondary | ICD-10-CM | POA: Diagnosis not present

## 2016-06-08 DIAGNOSIS — D72829 Elevated white blood cell count, unspecified: Secondary | ICD-10-CM | POA: Diagnosis not present

## 2016-06-08 LAB — H. PYLORI ANTIBODY, IGG: H Pylori IgG: <0.9 U/mL (ref 0.0–0.8)

## 2016-06-08 LAB — URINE CULTURE

## 2016-06-08 LAB — GLUCOSE, CAPILLARY
GLUCOSE-CAPILLARY: 121 mg/dL — AB (ref 65–99)
Glucose-Capillary: 87 mg/dL (ref 65–99)

## 2016-06-08 LAB — HEMOGLOBIN AND HEMATOCRIT, BLOOD
HEMATOCRIT: 39.3 % (ref 36.0–46.0)
HEMOGLOBIN: 13.1 g/dL (ref 12.0–15.0)

## 2016-06-08 MED ORDER — PANTOPRAZOLE SODIUM 40 MG PO TBEC: 40.0000 mg | DELAYED_RELEASE_TABLET | Freq: Two times a day (BID) | ORAL | 0 refills | Status: DC

## 2016-06-08 MED ORDER — BACID PO TABS: 1.0000 | ORAL_TABLET | Freq: Every day | ORAL | 0 refills | Status: AC

## 2016-06-08 MED ORDER — SUCRALFATE 1 G PO TABS: 1.0000 g | ORAL_TABLET | Freq: Four times a day (QID) | ORAL | 0 refills | Status: DC

## 2016-06-08 MED ORDER — BISACODYL 10 MG RE SUPP
10.0000 mg | Freq: Once | RECTAL | Status: DC
Start: 2016-06-08 — End: 2016-06-08
  Filled 2016-06-08: qty 1

## 2016-06-08 NOTE — NC FL2 (Signed)
  [place] MEDICAID FL2 LEVEL OF CARE SCREENING TOOL     IDENTIFICATION  Patient Name: Cassidy [name] Birthdate: 03/22/1934 Sex: female Admission Date (Current Location): 06/06/2016  [facility] [place]   Facility and Address:  [facility], [address], [place], [place] [address]      Provider Number: [id number]  Attending Physician Name and Address:  Edsel PetrinMaryann Mikhail, DO  Relative Name and Phone Number:       Current Level of Care: Hospital Recommended Level of Care: Assisted Living Facility Prior Approval Number:    Date Approved/Denied:   PASRR Number:    Discharge Plan: Other (Comment) (ALF)    Current Diagnoses: Patient Active Problem List   Diagnosis Date Noted  . Intractable abdominal pain 06/06/2016  . Diarrhea 06/06/2016  . Leukocytosis 06/06/2016  . Essential hypertension   . Chest pain, rule out acute myocardial infarction 05/10/2016  . Dizziness 05/10/2016  . Bradycardia 05/10/2016  . Subacute confusional state 05/10/2016  . Adjustment disorder with other symptoms 05/10/2016    Orientation RESPIRATION BLADDER Height & Weight     Self, Time, Situation, Place  Normal Continent Weight: 58.5 kg (129 lb) Height:  5\' 3"  (160 cm)  BEHAVIORAL SYMPTOMS/MOOD NEUROLOGICAL BOWEL NUTRITION STATUS      Continent Diet (Regular- Chopped)  AMBULATORY STATUS COMMUNICATION OF NEEDS Skin   Independent Verbally Normal                       Personal Care Assistance Level of Assistance  Bathing, Feeding, Dressing Bathing Assistance: Limited assistance Feeding assistance: Independent Dressing Assistance: Independent     Functional Limitations Info             SPECIAL CARE FACTORS FREQUENCY                       Contractures      Additional Factors Info  Code Status, Allergies, Psychotropic Code Status Info: Full Allergies Info: Ciprofloxacin Hcl, Prednisone, Zantac Ranitidine Psychotropic Info:  Zoloft         Current Medications (06/08/2016):   Discharge Medications: CONTINUE these medications which have NOT CHANGED   Details  meclizine (ANTIVERT) 25 MG tablet Take 1 tablet (25 mg total) by mouth 3 (three) times daily as needed for dizziness. Qty: 30 tablet, Refills: 0    meloxicam (MOBIC) 15 MG tablet Take 15 mg by mouth daily.    Multiple Vitamin (MULTIVITAMIN WITH MINERALS) TABS tablet Take 1 tablet by mouth daily.    sertraline (ZOLOFT) 25 MG tablet Take 25 mg by mouth at bedtime.    traZODone (DESYREL) 50 MG tablet Take 0.5 tablets (25 mg total) by mouth at bedtime as needed for sleep. Qty: 30 tablet, Refills: 0    triamterene-hydrochlorothiazide (MAXZIDE-25) 37.5-25 MG tablet Take 1 tablet by mouth daily.         STOP taking these medications     lactobacillus acidophilus (BACID) TABS tablet        Relevant Imaging Results:  Relevant Lab Results:   Additional Information SSN: [id number]  Mearl Latinadia S Dierdra Salameh, [place]

## 2016-06-08 NOTE — Care Management Note (Signed)
Case Management Note  Patient Details  Name: Cassidy BeardDolores R Heiser MRN: [id number] Date of Birth: 03/15/1934  Subjective/Objective:      Presents with ABD pain, hx of Arthritis, HTN, TKA.            Action/Plan: Plan is to d/c today back to ALF. CSW managing disposition.  Expected Discharge Date:   06/08/2016          Expected Discharge Plan:  [facility]isted Living [facility]  In-House Referral:  Clinical Social Work  Discharge planning Services  CM Consult    Status of Service:  Completed, signed off  If discussed at Microsoft[place] Length of Stay Meetings, dates discussed:    Additional Comments:  Epifanio LeschesCole, Shanara Schnieders Hudson, RN 06/08/2016, 2:00 PM

## 2016-06-08 NOTE — Discharge Summary (Signed)
Physician Discharge Summary  Cassidy [name] ZOX:[id number] DOB: 1934-02-23 DOA: 06/06/2016  PCP: No primary care provider on file.  Admit date: 06/06/2016 Discharge date: 06/08/2016  Time spent: 45 minutes  Recommendations for Outpatient Follow-up:  Patient will be discharged to assisted living facility.  Patient will need to follow up with primary care provider within one week of discharge. Follow up with Dr. Matthias Hughs, gastroenterologist. Patient should continue medications as prescribed.  Patient should follow a dysphagia 3/heart healthy diet.   Discharge Diagnoses:  Intractable abdominal pain/Gastric Ulcer Acute on chronic diarrhea Essential hypertension Depression Generalized weakness  Discharge Condition: Stable  Diet recommendation: heart healthy  Filed Weights   06/06/16 0455 06/07/16 0652 06/08/16 0441  Weight: 59 kg (130 lb) 58.3 kg (128 lb 9.6 oz) 58.5 kg (129 lb)    History of present illness:  on 06/06/2016 by Ms. Marlowe Kays, PA Cassidy [name] a 82 y.o.femaleRecently hospitalized on 10/19-10/21 for dizziness and chest pain with essentially negative workup, now presenting with 1 day history of intractable abdominal pain, nausea and severe diarrhea, with 5 stools at the ED. She reports having chronic lose stools but these were "different" watery. denies any food poisoning, bloody stools or emesis. Abdominal pain is dull, 3/4, radiating across the lower abdomen. Denies any sick contacts. He denies any fever, chills or night sweats. No chest pain or shortness of breath. Denies any dizziness or vertigo. He denies any rashes or cuts.Denies any myalgias. No recent antibiotics or changes in her meds. She reports living in a shelter, but it appears per records that she lives in [facility] assisted living . Denies tobacco or ETOH. Denies dysuria or hematuria, but does report frequency. No confusion is noted.   Hospital Course:  Intractable abdominal pain/Gastric  Ulcer -Gastroneterology consulted and appreciated -hemoglobin stable, currently 13.1 -s/p EGD: Normal esophagus, nonbleeding gastric ulcer with pigmented material, erythematous duodenopathy -S/p Colonoscopy: Diverticulosis in the sigmoid and descending colon. No overt colitis, pseudomembranous, other constipation diarrhea  -Continue PPI, sucralfate -Meloxicam discontinued -Biopsies from colonoscopy and EGD pending -Diet advanced and tolerated well -Patient will need to follow up with GI regarding biopsy results -Hpylori negative (<0.9)  Acute on chronic diarrhea -Seems to have resolved, unclear etiology  -biopsies pending -Stool pathogen panel negative -now complains of constipation  Essential hypertension -Continue maxzide  Depression -Continue zoloft  Generalized weakness -PT/OT consulted, no therapy follow up needed  Consultants Gastroenterology  Procedures  EGD Colonoscopy  Discharge Exam: Vitals:   06/07/16 2207 06/08/16 0441  BP: (!) 121/37 (!) 116/40  Pulse: (!) 58 63  Resp: 17 18  Temp: 98.5 F (36.9 C) 97.4 F (36.3 C)   Denies any chest pain, shortness of breath, current abdominal pain, nausea or vomiting. Denies any further diarrhea.   Exam  General: Well developed, well nourished, NAD, appears stated age  HEENT: NCAT, mucous membranes moist.   Cardiovascular: S1 S2 auscultated, no rubs, murmurs or gallops. Regular rate and rhythm.  Respiratory: Clear to auscultation bilaterally with equal chest rise  Abdomen: Soft, nontender, nondistended, + bowel sounds  Extremities: warm dry without cyanosis clubbing or edema  Neuro: AAOx3, nonfocal  Psych: Normal affect and demeanor   Discharge Instructions  Current Discharge Medication List    CONTINUE these medications which have NOT CHANGED   Details  meclizine (ANTIVERT) 25 MG tablet Take 1 tablet (25 mg total) by mouth 3 (three) times daily as needed for dizziness. Qty: 30 tablet,  Refills: 0    meloxicam (  MOBIC) 15 MG tablet Take 15 mg by mouth daily.    Multiple Vitamin (MULTIVITAMIN WITH MINERALS) TABS tablet Take 1 tablet by mouth daily.    sertraline (ZOLOFT) 25 MG tablet Take 25 mg by mouth at bedtime.    traZODone (DESYREL) 50 MG tablet Take 0.5 tablets (25 mg total) by mouth at bedtime as needed for sleep. Qty: 30 tablet, Refills: 0    triamterene-hydrochlorothiazide (MAXZIDE-25) 37.5-25 MG tablet Take 1 tablet by mouth daily.      STOP taking these medications     lactobacillus acidophilus (BACID) TABS tablet        Allergies  Allergen Reactions  . Ciprofloxacin Hcl Other (See Comments)    Reaction: unknown   . Prednisone Other (See Comments)    Reaction: unknown   . Zantac [Ranitidine] Other (See Comments)    Reaction: unknown  "too strong"       The results of significant diagnostics from this hospitalization (including imaging, microbiology, ancillary and laboratory) are listed below for reference.    Significant Diagnostic Studies: Ct Head Wo Contrast  Result Date: 05/10/2016 CLINICAL DATA:  82 year old female with dizziness EXAM: CT HEAD WITHOUT CONTRAST TECHNIQUE: Contiguous axial images were obtained from the base of the skull through the vertex without intravenous contrast. COMPARISON:  None. FINDINGS: Brain: The ventricles and sulci are appropriate in size for patient's age. Mild periventricular and deep white matter chronic microvascular ischemic changes noted. There is no acute intracranial hemorrhage. No mass effect or midline shift noted. Vascular: No hyperdense vessel or unexpected calcification. Skull: Normal. Negative for fracture or focal lesion. Sinuses/Orbits: There is partial opacification of multiple right mastoid air cells. The left mastoid air cells and paranasal sinuses are clear. Other: None IMPRESSION: No acute intracranial hemorrhage. Mild age-related atrophy and chronic microvascular ischemic disease. If symptoms  persist and there are no contraindications, MRI may provide better evaluation if clinically indicated Electronically Signed   By: Elgie Collard M.D.   On: 05/10/2016 06:12   Mr Brain Wo Contrast  Result Date: 05/10/2016 CLINICAL DATA:  Dizziness EXAM: MRI HEAD WITHOUT CONTRAST MRA HEAD WITHOUT CONTRAST TECHNIQUE: Multiplanar, multiecho pulse sequences of the brain and surrounding structures were obtained without intravenous contrast. Angiographic images of the head were obtained using MRA technique without contrast. COMPARISON:  CT head 05/10/2016 FINDINGS: MRI HEAD FINDINGS Brain: Generalized mild atrophy. Negative for hydrocephalus. Negative for acute infarct. Chronic microvascular ischemic change in the white matter and pons. The patient was not able to complete the study and several sequences were omitted. Vascular: Normal flow void Skull and upper cervical spine: Negative Sinuses/Orbits: Right mastoid sinus effusion. No obstructing nasopharyngeal mass. Mild mucosal edema paranasal sinuses. No orbital lesion. Other: None MRA HEAD FINDINGS Both vertebral arteries widely patent to the basilar. PICA patent bilaterally. Basilar widely patent. Superior cerebellar and posterior cerebral arteries appear normal bilaterally Cavernous carotid is normal bilaterally. Anterior and middle cerebral arteries are normal bilaterally. No stenosis or large vessel occlusion Negative for cerebral aneurysm. IMPRESSION: Atrophy and chronic microvascular ischemic change. No acute intracranial abnormality. Note the patient was not able to complete this MRI of the brain Negative MRA head Electronically Signed   By: Marlan Palau M.D.   On: 05/10/2016 19:25   Ct Abdomen Pelvis W Contrast  Result Date: 06/06/2016 CLINICAL DATA:  Constant diarrhea for 3 months EXAM: CT ABDOMEN AND PELVIS WITH CONTRAST TECHNIQUE: Multidetector CT imaging of the abdomen and pelvis was performed using the standard protocol following bolus  administration of intravenous contrast. CONTRAST:  1 ISOVUE-300 IOPAMIDOL (ISOVUE-300) INJECTION 61% COMPARISON:  None. FINDINGS: Lower chest: Lung bases are clear. Hepatobiliary: No focal liver abnormality is seen. No gallstones, gallbladder wall thickening, or biliary dilatation. Pancreas: Unremarkable. No pancreatic ductal dilatation or surrounding inflammatory changes. Spleen: Normal in size without focal abnormality. Adrenals/Urinary Tract: Adrenal glands are unremarkable. Kidneys are normal, without renal calculi, focal lesion, or hydronephrosis. Bladder is unremarkable. Stomach/Bowel: Focal severe thickening of the antrum of the stomach. Air-fluid levels throughout the colon consistent with diarrhea. No pneumatosis, pneumoperitoneum or portal venous gas. Diverticulosis of the sigmoid colon without evidence of diverticulitis. Vascular/Lymphatic: Normal caliber abdominal aorta with atherosclerosis. No lymphadenopathy. Reproductive: Status post hysterectomy. No adnexal masses. Other: No abdominal wall hernia or abnormality. No abdominopelvic ascites. Musculoskeletal: No lytic or sclerotic osseous lesion. Degenerative disc disease with disc height loss at L5-S1 with bilateral facet arthropathy. Bilateral foraminal narrowing at L5-S1. IMPRESSION: 1. Focal severe thickening of the antrum of the stomach. The appearance is concerning for malignancy versus severe gastritis. Recommend further evaluation with upper endoscopy. 2. Air-fluid levels throughout the colon consistent with diarrhea. Electronically Signed   By: Elige Ko   On: 06/06/2016 09:37   Nm Myocar Multi W/spect W/wall Motion / Ef  Result Date: 05/12/2016 CLINICAL DATA:  Chest pain EXAM: MYOCARDIAL IMAGING WITH SPECT (REST AND PHARMACOLOGIC-STRESS) GATED LEFT VENTRICULAR WALL MOTION STUDY LEFT VENTRICULAR EJECTION FRACTION TECHNIQUE: Standard myocardial SPECT imaging was performed after resting intravenous injection of 10 mCi Tc-73m tetrofosmin.  Subsequently, intravenous infusion of Lexiscan was performed under the supervision of the Cardiology staff. At peak effect of the drug, 30 mCi Tc-85m tetrofosmin was injected intravenously and standard myocardial SPECT imaging was performed. Quantitative gated imaging was also performed to evaluate left ventricular wall motion, and estimate left ventricular ejection fraction. COMPARISON:  None available FINDINGS: Perfusion: Mild fixed defect of the apex extending into the septum, compatible with remote infarct or scar. No decreased activity in the left ventricle on stress imaging to suggest reversible ischemia or infarction. Wall Motion: Mild septal hypokinesis otherwise normal left ventricular wall motion. No LV chamber dilatation. Left Ventricular Ejection Fraction: 64 % End diastolic volume 58 ml End systolic volume 21 ml IMPRESSION: 1. Mild fixed apical septal defect, suspect remote infarction or scar. No significant inducible or reversible ischemia with pharmacologic stress. 2. Mild septal hypokinesis otherwise normal wall motion. 3. Left ventricular ejection fraction 64% 4. Non invasive risk stratification*: Low *2012 Appropriate Use Criteria for Coronary Revascularization Focused Update: J Am Coll Cardiol. 2012;59(9):857-881. http://content.dementiazones.com.aspx?articleid=[email or web address] Electronically Signed   By: Judie Petit.  Shick M.D.   On: 05/12/2016 12:11   Dg Chest Port 1 View  Result Date: 05/10/2016 CLINICAL DATA:  Diffuse chest pain EXAM: PORTABLE CHEST 1 VIEW COMPARISON:  01/14/2016 FINDINGS: Normal heart size and mediastinal contours. No acute infiltrate or edema. No effusion or pneumothorax. Bulky spondylosis. Osteopenia. No acute osseous findings. IMPRESSION: No active disease. Electronically Signed   By: Marnee Spring M.D.   On: 05/10/2016 07:45   Mr Maxine Glenn Headm  Result Date: 05/10/2016 CLINICAL DATA:  Dizziness EXAM: MRI HEAD WITHOUT CONTRAST MRA HEAD WITHOUT CONTRAST TECHNIQUE: Multiplanar,  multiecho pulse sequences of the brain and surrounding structures were obtained without intravenous contrast. Angiographic images of the head were obtained using MRA technique without contrast. COMPARISON:  CT head 05/10/2016 FINDINGS: MRI HEAD FINDINGS Brain: Generalized mild atrophy. Negative for hydrocephalus. Negative for acute infarct. Chronic microvascular ischemic change in the white matter and pons. The patient  was not able to complete the study and several sequences were omitted. Vascular: Normal flow void Skull and upper cervical spine: Negative Sinuses/Orbits: Right mastoid sinus effusion. No obstructing nasopharyngeal mass. Mild mucosal edema paranasal sinuses. No orbital lesion. Other: None MRA HEAD FINDINGS Both vertebral arteries widely patent to the basilar. PICA patent bilaterally. Basilar widely patent. Superior cerebellar and posterior cerebral arteries appear normal bilaterally Cavernous carotid is normal bilaterally. Anterior and middle cerebral arteries are normal bilaterally. No stenosis or large vessel occlusion Negative for cerebral aneurysm. IMPRESSION: Atrophy and chronic microvascular ischemic change. No acute intracranial abnormality. Note the patient was not able to complete this MRI of the brain Negative MRA head Electronically Signed   By: Marlan Palauharles  Clark M.D.   On: 05/10/2016 19:25    Microbiology: Recent Results (from the past 240 hour(s))  Urine culture     Status: Abnormal   Collection Time: 06/06/16  4:58 AM  Result Value Ref Range Status   Specimen Description URINE, RANDOM  Final   Special Requests NONE  Final   Culture MULTIPLE SPECIES PRESENT, SUGGEST RECOLLECTION (A)  Final   Report Status 06/07/2016 FINAL  Final  Gastrointestinal Panel by PCR , Stool     Status: None   Collection Time: 06/06/16  7:30 AM  Result Value Ref Range Status   Campylobacter species NOT DETECTED NOT DETECTED Final   Plesimonas shigelloides NOT DETECTED NOT DETECTED Final   Salmonella  species NOT DETECTED NOT DETECTED Final   Yersinia enterocolitica NOT DETECTED NOT DETECTED Final   Vibrio species NOT DETECTED NOT DETECTED Final   Vibrio cholerae NOT DETECTED NOT DETECTED Final   Enteroaggregative E coli (EAEC) NOT DETECTED NOT DETECTED Final   Enteropathogenic E coli (EPEC) NOT DETECTED NOT DETECTED Final   Enterotoxigenic E coli (ETEC) NOT DETECTED NOT DETECTED Final   Shiga like toxin producing E coli (STEC) NOT DETECTED NOT DETECTED Final   Shigella/Enteroinvasive E coli (EIEC) NOT DETECTED NOT DETECTED Final   Cryptosporidium NOT DETECTED NOT DETECTED Final   Cyclospora cayetanensis NOT DETECTED NOT DETECTED Final   Entamoeba histolytica NOT DETECTED NOT DETECTED Final   Giardia lamblia NOT DETECTED NOT DETECTED Final   Adenovirus F40/41 NOT DETECTED NOT DETECTED Final   Astrovirus NOT DETECTED NOT DETECTED Final   Norovirus GI/GII NOT DETECTED NOT DETECTED Final   Rotavirus A NOT DETECTED NOT DETECTED Final   Sapovirus (I, II, IV, and V) NOT DETECTED NOT DETECTED Final  Urine culture     Status: Abnormal   Collection Time: 06/07/16  4:58 AM  Result Value Ref Range Status   Specimen Description URINE, RANDOM  Final   Special Requests NONE  Final   Culture MULTIPLE SPECIES PRESENT, SUGGEST RECOLLECTION (A)  Final   Report Status 06/08/2016 FINAL  Final     Labs: Basic Metabolic Panel:  Recent Labs Lab 06/06/16 0459 06/07/16 0548  NA 139 139  K 5.0 4.2  CL 105 110  CO2 24 22  GLUCOSE 99 83  BUN 19 13  CREATININE 0.98 0.85  CALCIUM 9.9 8.9   Liver Function Tests:  Recent Labs Lab 06/06/16 0459 06/07/16 0548  AST 19 15  ALT 16 12*  ALKPHOS 64 55  BILITOT 0.6 0.7  PROT 6.9 5.7*  ALBUMIN 3.9 3.0*    Recent Labs Lab 06/06/16 0459  LIPASE 63*   No results for input(s): AMMONIA in the last 168 hours. CBC:  Recent Labs Lab 06/06/16 0459 06/07/16 0548 06/08/16 0739  WBC  12.7* 9.6  --   HGB 13.5 11.8* 13.1  HCT 39.9 36.1 39.3  MCV  93.9 94.5  --   PLT 338 291  --    Cardiac Enzymes: No results for input(s): CKTOTAL, CKMB, CKMBINDEX, TROPONINI in the last 168 hours. BNP: BNP (last 3 results)  Recent Labs  09/14/15 1337  BNP 72.4    ProBNP (last 3 results) No results for input(s): PROBNP in the last 8760 hours.  CBG:  Recent Labs Lab 06/07/16 1153 06/07/16 1711 06/07/16 2207 06/08/16 0750 06/08/16 1216  GLUCAP 149* 120* 111* 87 121*       Signed:  Philomina Leon  [facility] 06/08/2016, 12:30 PM

## 2016-06-08 NOTE — NC FL2 (Signed)
  [place] MEDICAID FL2 LEVEL OF CARE SCREENING TOOL     IDENTIFICATION  Patient Name: Cassidy [name] Birthdate: 04/23/1934 Sex: female Admission Date (Current Location): 06/06/2016  [facility] [place]   Facility and Address:  [facility] [place], [address], [place], [place] [address]      Provider Number: [id number]  Attending Physician Name and Address:  Edsel PetrinMaryann Mikhail, DO  Relative Name and Phone Number:       Current Level of Care: Hospital Recommended Level of Care: Assisted Living Facility Prior Approval Number:    Date Approved/Denied:   PASRR Number:    Discharge Plan: Other (Comment) (ALF)    Current Diagnoses: Patient Active Problem List   Diagnosis Date Noted  . Intractable abdominal pain 06/06/2016  . Diarrhea 06/06/2016  . Leukocytosis 06/06/2016  . Essential hypertension   . Chest pain, rule out acute myocardial infarction 05/10/2016  . Dizziness 05/10/2016  . Bradycardia 05/10/2016  . Subacute confusional state 05/10/2016  . Adjustment disorder with other symptoms 05/10/2016    Orientation RESPIRATION BLADDER Height & Weight     Self, Time, Situation, Place  Normal Continent Weight: 58.5 kg (129 lb) Height:  5\' 3"  (160 cm)  BEHAVIORAL SYMPTOMS/MOOD NEUROLOGICAL BOWEL NUTRITION STATUS      Continent  (Regular-Chopped, thin liquids)  AMBULATORY STATUS COMMUNICATION OF NEEDS Skin   Independent Verbally Normal                       Personal Care Assistance Level of Assistance  Bathing, Feeding, Dressing Bathing Assistance: Limited assistance Feeding assistance: Independent Dressing Assistance: Independent     Functional Limitations Info             SPECIAL CARE FACTORS FREQUENCY                       Contractures      Additional Factors Info  Code Status, Allergies, Psychotropic Code Status Info: Full Allergies Info: Ciprofloxacin Hcl, Prednisone, Zantac  Ranitidine Psychotropic Info: Zoloft         Current Medications (06/08/2016):    Discharge Medications: CONTINUE these medications which have NOT CHANGED   Details  meclizine (ANTIVERT) 25 MG tablet Take 1 tablet (25 mg total) by mouth 3 (three) times daily as needed for dizziness. Qty: 30 tablet, Refills: 0    meloxicam (MOBIC) 15 MG tablet Take 15 mg by mouth daily.    Multiple Vitamin (MULTIVITAMIN WITH MINERALS) TABS tablet Take 1 tablet by mouth daily.    sertraline (ZOLOFT) 25 MG tablet Take 25 mg by mouth at bedtime.    traZODone (DESYREL) 50 MG tablet Take 0.5 tablets (25 mg total) by mouth at bedtime as needed for sleep. Qty: 30 tablet, Refills: 0    triamterene-hydrochlorothiazide (MAXZIDE-25) 37.5-25 MG tablet Take 1 tablet by mouth daily.         STOP taking these medications     lactobacillus acidophilus (BACID) TABS tablet     Relevant Imaging Results:  Relevant Lab Results:   Additional Information SSN: [id number]  Cassidy [name], [place]

## 2016-06-08 NOTE — Progress Notes (Signed)
Patient will DC to: [name] Anticipated DC date: 06/08/16 Family notified: N/A Transport by: Sharin MonsPTAR   Per MD patient ready for DC to ALF. RN, patient, patient's family, and facility notified of DC. Discharge Summary sent to facility. RN given number for report. DC packet on chart. Ambulance transport requested for patient.   CSW signing off.  Cristobal GoldmannNadia Evelia Waskey, [place] Clinical Social Worker [phone]

## 2016-06-08 NOTE — Progress Notes (Signed)
NURSING PROGRESS NOTE  Cassidy BeardDolores R Tedder [id number] Discharge Data: 06/08/2016 6:45 PM Attending Provider: Edsel PetrinMaryann Mikhail, DO PCP:No primary care provider on file.     Cassidy [name] to be D/C'd Assisted Living Facility per MD order.  Discussed with the patient the After Visit Summary and all questions fully answered. All IV's discontinued with no bleeding noted. All belongings returned to patient for patient to take home.   Last Vital Signs:  Blood pressure (!) 116/40, pulse 63, temperature 97.4 F (36.3 C), temperature source Oral, resp. rate 18, height 5\' 3"  (1.6 m), weight 58.5 kg (129 lb), SpO2 98 %.  Discharge Medication List   Medication List    STOP taking these medications   meloxicam 15 MG tablet Commonly known as:  MOBIC     TAKE these medications   lactobacillus acidophilus Tabs tablet Take 1 tablet by mouth daily. For 30 days   meclizine 25 MG tablet Commonly known as:  ANTIVERT Take 1 tablet (25 mg total) by mouth 3 (three) times daily as needed for dizziness.   multivitamin with minerals Tabs tablet Take 1 tablet by mouth daily.   pantoprazole 40 MG tablet Commonly known as:  PROTONIX Take 1 tablet (40 mg total) by mouth 2 (two) times daily before a meal.   sertraline 25 MG tablet Commonly known as:  ZOLOFT Take 25 mg by mouth at bedtime.   sucralfate 1 g tablet Commonly known as:  CARAFATE Take 1 tablet (1 g total) by mouth 4 (four) times daily.   traZODone 50 MG tablet Commonly known as:  DESYREL Take 0.5 tablets (25 mg total) by mouth at bedtime as needed for sleep.   triamterene-hydrochlorothiazide 37.5-25 MG tablet Commonly known as:  MAXZIDE-25 Take 1 tablet by mouth daily.        Bennie Pieriniyndi Ocean Kearley, RN

## 2016-06-08 NOTE — Progress Notes (Signed)
Patient states that her diarrhea remains in remission, and she is eating a regular diet.  The biopsies from her recent endoscopy and colonoscopy are now available.  Duodenal and random colonic biopsies were negative for sprue or microscopic colitis, respectively. Therefore, no histologic cause for her recent diarrhea is evident, which correlates with the fact that she is now doing better.  The biopsies from her gastric ulcer were benign.  Her blood test for Helicobacter pylori antibody is negative. This makes it likely that her meloxicam was the cause of her gastric ulceration.  Recommendations:  1. Okay for discharge from the GI tract standpoint at any time  2. Avoid nonsteroidal anti-inflammatory drugs and aspirin if at all possible  3. I would do twice daily PPI therapy for a month, and then once daily thereafter, probably indefinitely (certainly indefinitely if the patient needs to go back on ulcerogenic medications).  4. I think that the sucralfate can be stopped in about 2 weeks, or if needed for simplest indication of medical regimen, it could even be stopped whenever she goes home from the hospital.  5. Ideally, the patient would have follow-up endoscopy in a couple of months to confirm ulcer healing. Knowing that her residential situation may be changing, I have given her my card and advised her to call us to arrange that procedure after Christmas, and I think she has good understanding of that.  6. I will sign off. Please call  us if we can be of further assistance in this patient's care.  Florencia Reasonsobert V. Emerlyn Mehlhoff, M.D. Pager [phone] If no answer or after 5 PM call [phone]

## 2016-06-08 NOTE — Discharge Instructions (Signed)

## 2016-06-10 ENCOUNTER — Encounter (HOSPITAL_COMMUNITY): Payer: Self-pay | Admitting: Emergency Medicine

## 2016-06-10 ENCOUNTER — Inpatient Hospital Stay (HOSPITAL_COMMUNITY)
Admission: EM | Admit: 2016-06-10 | Discharge: 2016-06-12 | DRG: 384 | Disposition: A | Discharge status: Skilled Nursing Facility | Payer: Medicare HMO | Attending: Internal Medicine | Admitting: Internal Medicine

## 2016-06-10 DIAGNOSIS — Z87891 Personal history of nicotine dependence: Secondary | ICD-10-CM

## 2016-06-10 DIAGNOSIS — Z8711 Personal history of peptic ulcer disease: Secondary | ICD-10-CM

## 2016-06-10 DIAGNOSIS — R42 Dizziness and giddiness: Secondary | ICD-10-CM

## 2016-06-10 DIAGNOSIS — R109 Unspecified abdominal pain: Secondary | ICD-10-CM | POA: Diagnosis present

## 2016-06-10 DIAGNOSIS — F329 Major depressive disorder, single episode, unspecified: Secondary | ICD-10-CM | POA: Diagnosis present

## 2016-06-10 DIAGNOSIS — I951 Orthostatic hypotension: Secondary | ICD-10-CM

## 2016-06-10 DIAGNOSIS — F419 Anxiety disorder, unspecified: Secondary | ICD-10-CM | POA: Diagnosis present

## 2016-06-10 DIAGNOSIS — R197 Diarrhea, unspecified: Secondary | ICD-10-CM | POA: Diagnosis present

## 2016-06-10 DIAGNOSIS — K5900 Constipation, unspecified: Secondary | ICD-10-CM | POA: Diagnosis not present

## 2016-06-10 DIAGNOSIS — Z23 Encounter for immunization: Secondary | ICD-10-CM

## 2016-06-10 DIAGNOSIS — M1990 Unspecified osteoarthritis, unspecified site: Secondary | ICD-10-CM | POA: Diagnosis present

## 2016-06-10 DIAGNOSIS — Z888 Allergy status to other drugs, medicaments and biological substances status: Secondary | ICD-10-CM

## 2016-06-10 DIAGNOSIS — K5730 Diverticulosis of large intestine without perforation or abscess without bleeding: Secondary | ICD-10-CM | POA: Diagnosis present

## 2016-06-10 DIAGNOSIS — R531 Weakness: Secondary | ICD-10-CM | POA: Diagnosis present

## 2016-06-10 DIAGNOSIS — Z79899 Other long term (current) drug therapy: Secondary | ICD-10-CM

## 2016-06-10 DIAGNOSIS — R001 Bradycardia, unspecified: Secondary | ICD-10-CM | POA: Diagnosis present

## 2016-06-10 DIAGNOSIS — K259 Gastric ulcer, unspecified as acute or chronic, without hemorrhage or perforation: Principal | ICD-10-CM | POA: Diagnosis present

## 2016-06-10 DIAGNOSIS — I10 Essential (primary) hypertension: Secondary | ICD-10-CM | POA: Diagnosis present

## 2016-06-10 LAB — URINE MICROSCOPIC-ADD ON: WBC, UA: NONE SEEN WBC/hpf (ref 0–5)

## 2016-06-10 LAB — URINALYSIS, ROUTINE W REFLEX MICROSCOPIC
Bilirubin Urine: NEGATIVE
Glucose, UA: NEGATIVE mg/dL
Ketones, ur: NEGATIVE mg/dL
Leukocytes, UA: NEGATIVE
Nitrite: NEGATIVE
Protein, ur: NEGATIVE mg/dL
SPECIFIC GRAVITY, URINE: 1.009 (ref 1.005–1.030)
pH: 5.5 (ref 5.0–8.0)

## 2016-06-10 LAB — CBC WITH DIFFERENTIAL/PLATELET
Basophils Absolute: 0 10*3/uL (ref 0.0–0.1)
Basophils Relative: 0 %
EOS PCT: 3 %
Eosinophils Absolute: 0.3 10*3/uL (ref 0.0–0.7)
HEMATOCRIT: 35.2 % — AB (ref 36.0–46.0)
HEMOGLOBIN: 11.9 g/dL — AB (ref 12.0–15.0)
LYMPHS ABS: 2.7 10*3/uL (ref 0.7–4.0)
LYMPHS PCT: 29 %
MCH: 31.2 pg (ref 26.0–34.0)
MCHC: 33.8 g/dL (ref 30.0–36.0)
MCV: 92.1 fL (ref 78.0–100.0)
Monocytes Absolute: 0.8 10*3/uL (ref 0.1–1.0)
Monocytes Relative: 9 %
NEUTROS ABS: 5.3 10*3/uL (ref 1.7–7.7)
Neutrophils Relative %: 59 %
PLATELETS: 294 10*3/uL (ref 150–400)
RBC: 3.82 MIL/uL — AB (ref 3.87–5.11)
RDW: 12.4 % (ref 11.5–15.5)
WBC: 9 10*3/uL (ref 4.0–10.5)

## 2016-06-10 LAB — TROPONIN I

## 2016-06-10 LAB — TSH: TSH: 3.421 u[IU]/mL (ref 0.350–4.500)

## 2016-06-10 LAB — COMPREHENSIVE METABOLIC PANEL
ALK PHOS: 55 U/L (ref 38–126)
ALT: 14 U/L (ref 14–54)
AST: 21 U/L (ref 15–41)
Albumin: 3.4 g/dL — ABNORMAL LOW (ref 3.5–5.0)
Anion gap: 9 (ref 5–15)
BILIRUBIN TOTAL: 0.5 mg/dL (ref 0.3–1.2)
BUN: 15 mg/dL (ref 6–20)
CALCIUM: 9.4 mg/dL (ref 8.9–10.3)
CO2: 24 mmol/L (ref 22–32)
CREATININE: 0.9 mg/dL (ref 0.44–1.00)
Chloride: 107 mmol/L (ref 101–111)
GFR, EST NON AFRICAN AMERICAN: 59 mL/min — AB (ref >60)
Glucose, Bld: 96 mg/dL (ref 65–99)
Potassium: 4.1 mmol/L (ref 3.5–5.1)
Sodium: 140 mmol/L (ref 135–145)
TOTAL PROTEIN: 6.3 g/dL — AB (ref 6.5–8.1)

## 2016-06-10 LAB — MRSA PCR SCREENING: MRSA by PCR: NEGATIVE

## 2016-06-10 MED ORDER — ONDANSETRON HCL 4 MG/2ML IJ SOLN: 4.0000 mg | Freq: Four times a day (QID) | INTRAMUSCULAR | Status: DC | PRN

## 2016-06-10 MED ORDER — RISA-BID PROBIOTIC PO TABS: 1.0000 | ORAL_TABLET | Freq: Every day | ORAL | Status: DC

## 2016-06-10 MED ORDER — BACID PO TABS: 1.0000 | ORAL_TABLET | Freq: Every day | ORAL | Status: DC

## 2016-06-10 MED ORDER — ACETAMINOPHEN 650 MG RE SUPP: 650.0000 mg | Freq: Four times a day (QID) | RECTAL | Status: DC | PRN

## 2016-06-10 MED ORDER — SENNOSIDES-DOCUSATE SODIUM 8.6-50 MG PO TABS: 1.0000 | ORAL_TABLET | Freq: Every evening | ORAL | Status: DC | PRN

## 2016-06-10 MED ORDER — MECLIZINE HCL 25 MG PO TABS: 25.0000 mg | ORAL_TABLET | Freq: Three times a day (TID) | ORAL | Status: DC | PRN

## 2016-06-10 MED ORDER — INFLUENZA VAC SPLIT QUAD 0.5 ML IM SUSY
0.5000 mL | PREFILLED_SYRINGE | INTRAMUSCULAR | Status: AC
  Administered 2016-06-11: 0.5 mL via INTRAMUSCULAR
  Filled 2016-06-10: qty 0.5

## 2016-06-10 MED ORDER — SERTRALINE HCL 50 MG PO TABS
50.0000 mg | ORAL_TABLET | Freq: Every day | ORAL | Status: DC
Start: 2016-06-10 — End: 2016-06-12
  Administered 2016-06-10 – 2016-06-11 (×2): 50 mg via ORAL
  Filled 2016-06-10 (×2): qty 1

## 2016-06-10 MED ORDER — ONDANSETRON HCL 4 MG PO TABS: 4.0000 mg | ORAL_TABLET | Freq: Four times a day (QID) | ORAL | Status: DC | PRN

## 2016-06-10 MED ORDER — TRAZODONE HCL 50 MG PO TABS: 25.0000 mg | ORAL_TABLET | Freq: Every evening | ORAL | Status: DC | PRN

## 2016-06-10 MED ORDER — ENOXAPARIN SODIUM 40 MG/0.4ML ~~LOC~~ SOLN
40.0000 mg | SUBCUTANEOUS | Status: DC
  Administered 2016-06-10 – 2016-06-11 (×2): 40 mg via SUBCUTANEOUS
  Filled 2016-06-10 (×2): qty 0.4

## 2016-06-10 MED ORDER — SODIUM CHLORIDE 0.9 % IV BOLUS (SEPSIS)
500.0000 mL | Freq: Once | INTRAVENOUS | Status: AC
  Administered 2016-06-10: 500 mL via INTRAVENOUS

## 2016-06-10 MED ORDER — SODIUM CHLORIDE 0.9% FLUSH: 3.0000 mL | Freq: Two times a day (BID) | INTRAVENOUS | Status: DC

## 2016-06-10 MED ORDER — SODIUM CHLORIDE 0.9 % IV SOLN
INTRAVENOUS | Status: DC
  Administered 2016-06-10 – 2016-06-11 (×2): via INTRAVENOUS
  Administered 2016-06-11: 75 mL/h via INTRAVENOUS

## 2016-06-10 MED ORDER — RISAQUAD PO CAPS
1.0000 | ORAL_CAPSULE | Freq: Every day | ORAL | Status: DC
  Administered 2016-06-10 – 2016-06-12 (×3): 1 via ORAL
  Filled 2016-06-10 (×3): qty 1

## 2016-06-10 MED ORDER — PANTOPRAZOLE SODIUM 40 MG PO TBEC
40.0000 mg | DELAYED_RELEASE_TABLET | Freq: Two times a day (BID) | ORAL | Status: DC
  Administered 2016-06-10 – 2016-06-11 (×3): 40 mg via ORAL
  Filled 2016-06-10 (×3): qty 1

## 2016-06-10 MED ORDER — ACETAMINOPHEN 325 MG PO TABS: 650.0000 mg | ORAL_TABLET | Freq: Four times a day (QID) | ORAL | Status: DC | PRN

## 2016-06-10 MED ORDER — SUCRALFATE 1 G PO TABS
1.0000 g | ORAL_TABLET | Freq: Four times a day (QID) | ORAL | Status: DC
  Administered 2016-06-10 – 2016-06-12 (×7): 1 g via ORAL
  Filled 2016-06-10 (×7): qty 1

## 2016-06-10 NOTE — ED Notes (Signed)
Meal tray ordered 

## 2016-06-10 NOTE — ED Notes (Signed)
Pt hooked up and ready for transport.  Pt ate all of food and said it was "very good"

## 2016-06-10 NOTE — Progress Notes (Signed)
Please see below most recent admission and discharge on 11/17. Patient is from [facility] and is a LTC resident at facility. She ambulates independently with minimal assistance. She has no contacts listed and previously was in local shelter before placement [facility].  Per last note: she does not want to return, however there are no other options unless she was to be homeless again. She is in observation, and will return to ALF at [place]. Will need updated FL2 if there are changes to her medications.  Will pass on to unit CSW. [facility]   Clinical Social Work Assessment  Patient Details  Name: Cassidy [name] MRN: [id number] Date of Birth: 08/17/1933  Date of referral:  06/07/16               Reason for consult:  Discharge Planning                           Permission sought to share information with:  [employer] Contact Representative Permission granted to share information::  Yes, Verbal Permission Granted             Name::                   Agency::  [name]             Relationship::                Contact Information:     Housing/Transportation Living arrangements for the past 2 months:  Assisted Living Facility Source of Information:  Patient, Facility Patient Interpreter Needed:  None Criminal Activity/Legal Involvement Pertinent to Current Situation/Hospitalization:  No - Comment as needed Significant Relationships:  None Lives with:  Facility Resident Do you feel safe going back to the place where you live?  No Need for family participation in patient care:  No (Coment)  Care giving concerns:  CSW received consult for discharge planning. [facility]. She reports that it is a homeless shelter. Facility med tech states that patient has no family or friends. They [facility] and report that she has complained of diarrhea for months. They have placed a psych consult and a GI consult.   Social Worker assessment / plan:  Patient  will return to ALF at discharge.   Employment status:  Retired Database administratornsurance information:  Managed Medicare PT Recommendations:    Information / Referral to community resources:     Patient/Family's Response to care:  Patient does not want to return to ALF, but CSW explained that she does not have other options.  Patient/Family's Understanding of and Emotional Response to Diagnosis, Current Treatment, and Prognosis:  No questions/concerns at this time.  Emotional Assessment Appearance:  Appears stated age Attitude/Demeanor/Rapport:  Other, Complaining Affect (typically observed):  Irritable Orientation:  Oriented to Self, Oriented to Place, Oriented to  Time, Oriented to Situation Alcohol / Substance use:  Not Applicable Psych involvement (Current and /or in the community):  No (Comment)  Discharge Needs  Concerns to be addressed:  Care Coordination Readmission within the last 30 days:  Yes Current discharge risk:  None Barriers to Discharge:  Continued Medical Work up   [employer] Rayyan, LCSWA 06/07/2016, 2:54 PM

## 2016-06-10 NOTE — ED Provider Notes (Signed)
[facility]-EMERGENCY DEPT Provider Note   CSN: [id number] Arrival date & time: 06/10/16  0636  History   Chief Complaint Chief Complaint  Patient presents with  . Abdominal Pain    HPI Cassidy [name] is a 82 y.o. female with pmh of gastric ulcer (diagnosed on EGD during recent hospital admission 06/06/16-06/08/16) and HTN presents to ED with chief complaint of "sore", constant 2/10 R sided abdominal pain that radiates to L without fever, n/v/d/c.  Pt recently admitted to hospital 06/06/16-06/08/16 for abdominal pain and diarrhea x 2.5 months, found to have gastric ulcer on EGD.  Pt states current abdominal pain is similar to abdominal pain she presented with at ED on 06/06/16, but has since improved in severity and she is no longer having diarrhea.  Pt states the living facility where she lives does not provide her good medical care and they haven't given her the medications she was supposed to start taking after being discharged from hospital 2 days ago.    HPI  Past Medical History:  Diagnosis Date  . Arthritis   . Diarrhea 05/2016  . HTN (hypertension)     Patient Active Problem List   Diagnosis Date Noted  . Intractable abdominal pain 06/06/2016  . Diarrhea 06/06/2016  . Leukocytosis 06/06/2016  . Essential hypertension   . Chest pain, rule out acute myocardial infarction 05/10/2016  . Dizziness 05/10/2016  . Bradycardia 05/10/2016  . Subacute confusional state 05/10/2016  . Adjustment disorder with other symptoms 05/10/2016    Past Surgical History:  Procedure Laterality Date  . ABDOMINAL HYSTERECTOMY    . COLONOSCOPY WITH PROPOFOL N/A 06/06/2016   Procedure: COLONOSCOPY WITH PROPOFOL;  Surgeon: Bernette Redbirdobert Buccini, MD;  [facility];  Service: Endoscopy;  Laterality: N/A;  . ESOPHAGOGASTRODUODENOSCOPY (EGD) WITH PROPOFOL N/A 06/06/2016   Procedure: ESOPHAGOGASTRODUODENOSCOPY (EGD) WITH PROPOFOL;  Surgeon: Bernette Redbirdobert Buccini, MD;  [facility];  Service:  Endoscopy;  Laterality: N/A;  . TOTAL KNEE ARTHROPLASTY    . TUBAL LIGATION      OB History    No data available       Home Medications    Prior to Admission medications   Medication Sig Start Date End Date Taking? Authorizing Provider  Multiple Vitamin (MULTIVITAMIN WITH MINERALS) TABS tablet Take 1 tablet by mouth daily.   Yes Historical Provider, MD  Probiotic Product (RISA-BID PROBIOTIC) TABS Take 1 tablet by mouth daily.   Yes Historical Provider, MD  sertraline (ZOLOFT) 50 MG tablet Take 50 mg by mouth at bedtime.  05/09/16  Yes Historical Provider, MD  lactobacillus acidophilus (BACID) TABS tablet Take 1 tablet by mouth daily. For 30 days Patient not taking: Reported on 06/10/2016 06/08/16 07/07/16  Nita SellsMaryann Mikhail, DO  meclizine (ANTIVERT) 25 MG tablet Take 1 tablet (25 mg total) by mouth 3 (three) times daily as needed for dizziness. Patient not taking: Reported on 06/10/2016 05/12/16   Richarda OverlieNayana Abrol, MD  pantoprazole (PROTONIX) 40 MG tablet Take 1 tablet (40 mg total) by mouth 2 (two) times daily before a meal. Patient not taking: Reported on 06/10/2016 06/08/16   Maryann Mikhail, DO  sucralfate (CARAFATE) 1 g tablet Take 1 tablet (1 g total) by mouth 4 (four) times daily. Patient not taking: Reported on 06/10/2016 06/08/16   Nita SellsMaryann Mikhail, DO  traZODone (DESYREL) 50 MG tablet Take 0.5 tablets (25 mg total) by mouth at bedtime as needed for sleep. Patient not taking: Reported on 06/10/2016 05/12/16   Richarda OverlieNayana Abrol, MD    Family History  Family History  Problem Relation Age of Onset  . Adopted: Yes    Social History Social History  Substance Use Topics  . Smoking status: Former Smoker    Packs/day: 1.00    Years: 10.00    Types: Cigarettes    Quit date: 05/01/2016  . Smokeless tobacco: Never Used  . Alcohol use No     Allergies   Ciprofloxacin hcl; Prednisone; and Zantac [ranitidine]   Review of Systems Review of Systems  Constitutional: Negative for  appetite change, chills, diaphoresis, fatigue and fever.  HENT: Negative for congestion, sore throat and trouble swallowing.   Eyes: Negative for visual disturbance.  Respiratory: Negative for cough, chest tightness and shortness of breath.   Cardiovascular: Negative for chest pain, palpitations and leg swelling.  Gastrointestinal: Positive for abdominal pain. Negative for abdominal distention, blood in stool, constipation, diarrhea (pt denies diarrhea since hospital discharge 2 days ago), nausea and vomiting.  Genitourinary: Positive for dysuria and urgency. Negative for enuresis, flank pain, frequency, hematuria and pelvic pain.  Musculoskeletal: Negative for back pain and neck pain.  Skin: Negative for rash.  Neurological: Positive for light-headedness. Negative for dizziness, syncope, facial asymmetry, weakness and headaches.  Hematological: Negative.   Psychiatric/Behavioral: Negative.      Physical Exam Updated Vital Signs BP (!) 149/44   Pulse 63   Temp 98.7 F (37.1 C) (Oral)   Resp 20   SpO2 100%   Physical Exam  Constitutional: She is oriented to person, place, and time. She appears well-developed and well-nourished. No distress.  HENT:  Head: Normocephalic and atraumatic.  Nose: Nose normal.  Mouth/Throat: Oropharynx is clear and moist. No oropharyngeal exudate.  Eyes: Conjunctivae are normal. Pupils are equal, round, and reactive to light.  Neck: Neck supple.  Cardiovascular: Regular rhythm, normal heart sounds and intact distal pulses.   No murmur heard. HR 47 fluctuating 47-58  Pulmonary/Chest: Effort normal and breath sounds normal. She has no wheezes.  Abdominal: Soft. Bowel sounds are normal. She exhibits no distension and no mass. There is tenderness (RLQ, periumbilical and LLQ). There is no rebound and no guarding.  Musculoskeletal: Normal range of motion.  Lymphadenopathy:    She has cervical adenopathy (nontender submandibular adenopathy bilaterally).    Neurological: She is alert and oriented to person, place, and time.  Skin: Skin is warm and dry. Capillary refill takes less than 2 seconds.  Psychiatric: She has a normal mood and affect.     ED Treatments / Results  Labs (all labs ordered are listed, but only abnormal results are displayed) Labs Reviewed  URINALYSIS, ROUTINE W REFLEX MICROSCOPIC (NOT AT [facility]) - Abnormal; Notable for the following:       Result Value   Hgb urine dipstick SMALL (*)    All other components within normal limits  CBC WITH DIFFERENTIAL/PLATELET - Abnormal; Notable for the following:    RBC 3.82 (*)    Hemoglobin 11.9 (*)    HCT 35.2 (*)    All other components within normal limits  COMPREHENSIVE METABOLIC PANEL - Abnormal; Notable for the following:    Total Protein 6.3 (*)    Albumin 3.4 (*)    GFR calc non Af Amer 59 (*)    All other components within normal limits  URINE MICROSCOPIC-ADD ON - Abnormal; Notable for the following:    Squamous Epithelial / LPF 0-5 (*)    Bacteria, UA RARE (*)    All other components within normal limits  TROPONIN I  TSH    EKG  EKG Interpretation  Date/Time:  Sunday June 10 2016 07:53:36 EST Ventricular Rate:  47 PR Interval:    QRS Duration: 92 QT Interval:  488 QTC Calculation: 432 R Axis:   46 Text Interpretation:  Sinus bradycardia Confirmed by RAY MD, DANIELLE ([id number]) on 06/10/2016 8:24:46 AM       Radiology No results found.  Procedures Procedures (including critical care time)  Medications Ordered in ED Medications  sodium chloride 0.9 % bolus 500 mL (not administered)  sucralfate (CARAFATE) tablet 1 g (not administered)  pantoprazole (PROTONIX) EC tablet 40 mg (not administered)  lactobacillus acidophilus (BACID) tablet 1 tablet (not administered)  traZODone (DESYREL) tablet 25 mg (not administered)  meclizine (ANTIVERT) tablet 25 mg (not administered)  RISA-BID PROBIOTIC TABS 1 tablet (not administered)  sertraline (ZOLOFT)  tablet 50 mg (not administered)  0.9 %  sodium chloride infusion (not administered)  acetaminophen (TYLENOL) tablet 650 mg (not administered)    Or  acetaminophen (TYLENOL) suppository 650 mg (not administered)  senna-docusate (Senokot-S) tablet 1 tablet (not administered)  ondansetron (ZOFRAN) tablet 4 mg (not administered)    Or  ondansetron (ZOFRAN) injection 4 mg (not administered)  enoxaparin (LOVENOX) injection 40 mg (not administered)  sodium chloride flush (NS) 0.9 % injection 3 mL (not administered)     Initial Impression / Assessment and Plan / ED Course  I have reviewed the triage vital signs and the nursing notes.  Pertinent labs & imaging results that were available during my care of the patient were reviewed by me and considered in my medical decision making (see chart for details).  Clinical Course    1. 82 y.o female with pmh of HTN and gastric ulcer (diagnosed at recent admission 06/06/16-06/08/16) presents to ED with chief complaint of "sore", dull 2/10 R sided abdominal pain that radiates to L without fever, n/v/d/c.  Pt admitted on 06/06/16 for abdominal pain and diarrhea x 2.60months, during hospital admission EGD and colonoscopy were done and pt was diagnosed with gastric ulcer, pending biopsies.  Pt was discharged 2 days ago with instruction to take high dose PPI and sucralfate and d/c meloxicam.  Per pt, the living facility where she lives has not been given pt her medications since discharge 2 days ago.  On exam abdomen soft, no rebound or masses but mildly TTP with deep palpation at RLQ and LLQ, no suprapubic tenderness or CVAT.  Abdominal pain likely d/t gastric ulcer less likely due to cardiac etiology.  CMP unremarkable.  CBC remarkable for mild anemia.  Pt denied pain medication while in ED for abdominal pain.  Pt to start PPI and sucralfate as per GI recommendation at discharge.   2. Pt also reports urinary urgency and "odd" sensation with urination x 2 days.  U/A  negative today.  3. Pt found to have HR 47 during exam.  When asked pt denies cardiac conditions, previous heart attacks.  Pt admits to light-headedness when going from sitting to standing without palpitations, chest pain or shortness of breath.  On exam pt HR is soft, 47-55 during exam.  HR continues to fluctuate 49-59 while in ED. No JVD, carotid pulses 2+ bilaterally.  Good S1 and S2, no murmurs.  Previous EKG 06/07/16 with similar HR in the low end of normal.  EKG today remarkable for bradycardia no blocks, orthostatics + for drop in SBP >20.  Negative troponin. Consult to hospital for possible admission for symptomatic bradycardia.    Final Clinical Impressions(s) /  ED Diagnoses   Final diagnoses:  Bradycardia    New Prescriptions New Prescriptions   No medications on file     Liberty Handy, PA-C 06/10/16 1135    Liberty Handy, PA-C 06/10/16 1141    Margarita Grizzle, MD 06/10/16 [phone]

## 2016-06-10 NOTE — ED Notes (Signed)
Pt ambulated to bathroom with minimal assistance.

## 2016-06-10 NOTE — ED Notes (Signed)
ED Provider at bedside. 

## 2016-06-10 NOTE — ED Notes (Signed)
Attempted report 

## 2016-06-10 NOTE — H&P (Signed)
History and Physical    Cassidy [name] YQM:[id number] DOB: 03/10/1934 DOA: 06/10/2016   PCP: No primary care provider on file.   Patient coming from:   SNF    Chief Complaint: Dizziness                               Abdominal Pain and Diarrhea   HPI: Cassidy [name] is a 82 y.o. female  with multiple hospitalizations, last from 11/15 to 11/17 for intractable abdominal pain and gastric ulcer, and acute on chronic diarrhea,now presenting today with similar GI symptoms with chronic abdominal pain, nausea without  diarrhea,  accompanied by dizziness without vertigo or headaches. No syncope or presyncope. She reports feeling very weak, especially when trying to seat or stand. She reports having similar symptoms in October and at the time had extesive negative workup, that included CT head, and Echo.  She denies any BB use. She denies any fever, chills or night sweats. No chest pain or shortness of breath. Denies any myalgias. She was instructed to take Protonix and Sulfacrate but did not fill prescription. Denies dysuria or hematuria, but does report  Chronic frequency. No confusion is noted.   Denies tobacco or ETOH.  At the ED she was noted to be bradycardic, with rate in the 40s. BP and HR are normal at this time. She is being admitted for evaluation of dizziness.     ED Course:  BP (!) 149/44   Pulse 63   Temp 98.7 F (37.1 C) (Oral)   Resp 20   SpO2 100%    Hemoglobin 11.9 white count 9 platelets 294 sodium 140 potassium 4.1 creatinine 0.9 direct bilirubin 0.5 troponin less than 0.03 CRP from 06/06/2016 was 0.8 and sedimentation rate was 70 glucose 96  Urinalysis is negative. EKG shows sinus bradycardia without any depressions or elevations, rate 46 Last 2-D echo on 05/11/2016 showed normal LV function, moderate LVH, EF 55-60%, normal wall motion, grade 1 diastolic dysfunction. Last CT scan on 05/10/2016 was negative  Prior admission GI evaluation :  -s/p EGD: Normal esophagus,  nonbleeding gastric ulcer with pigmented material, erythematous duodenopathy -S/p Colonoscopy: Diverticulosis in the sigmoid and descending colon. No overt colitis, pseudomembranous, other constipation diarrhea -Biopsies from colonoscopy and EGD were negative  -Patient will need to follow up with GI regarding biopsy results -Hpylori negative (<0.9)   Review of Systems: As per HPI otherwise 10 point review of systems negative.   Past Medical History:  Diagnosis Date  . Arthritis   . Diarrhea 05/2016  . HTN (hypertension)     Past Surgical History:  Procedure Laterality Date  . ABDOMINAL HYSTERECTOMY    . COLONOSCOPY WITH PROPOFOL N/A 06/06/2016   Procedure: COLONOSCOPY WITH PROPOFOL;  Surgeon: Bernette Redbird, MD;  Location: [facility] ENDOSCOPY;  Service: Endoscopy;  Laterality: N/A;  . ESOPHAGOGASTRODUODENOSCOPY (EGD) WITH PROPOFOL N/A 06/06/2016   Procedure: ESOPHAGOGASTRODUODENOSCOPY (EGD) WITH PROPOFOL;  Surgeon: Bernette Redbird, MD;  Location: [facility] ENDOSCOPY;  Service: Endoscopy;  Laterality: N/A;  . TOTAL KNEE ARTHROPLASTY    . TUBAL LIGATION      Social History Social History   Social History  . Marital status: Married    Spouse name: N/A  . Number of children: N/A  . Years of education: N/A   Occupational History  . Not on file.   Social History Main Topics  . Smoking status: Former Smoker    Packs/day: 1.00  Years: 10.00    Types: Cigarettes    Quit date: 05/01/2016  . Smokeless tobacco: Never Used  . Alcohol use No  . Drug use: No  . Sexual activity: Not on file   Other Topics Concern  . Not on file   Social History Narrative  . No narrative on file     Allergies  Allergen Reactions  . Ciprofloxacin Hcl Other (See Comments)    Reaction: unknown   . Prednisone Other (See Comments)    Reaction: unknown   . Zantac [Ranitidine] Other (See Comments)    Reaction: unknown  "too strong"     Family History  Problem Relation Age of Onset  . Adopted: Yes        Prior to Admission medications   Medication Sig Start Date End Date Taking? Authorizing Provider  Multiple Vitamin (MULTIVITAMIN WITH MINERALS) TABS tablet Take 1 tablet by mouth daily.   Yes Historical Provider, MD  Probiotic Product (RISA-BID PROBIOTIC) TABS Take 1 tablet by mouth daily.   Yes Historical Provider, MD  sertraline (ZOLOFT) 50 MG tablet Take 50 mg by mouth at bedtime.  05/09/16  Yes Historical Provider, MD  lactobacillus acidophilus (BACID) TABS tablet Take 1 tablet by mouth daily. For 30 days Patient not taking: Reported on 06/10/2016 06/08/16 07/07/16  Nita [name] Mikhail, DO  meclizine (ANTIVERT) 25 MG tablet Take 1 tablet (25 mg total) by mouth 3 (three) times daily as needed for dizziness. Patient not taking: Reported on 06/10/2016 05/12/16   Richarda Overlie, MD  pantoprazole (PROTONIX) 40 MG tablet Take 1 tablet (40 mg total) by mouth 2 (two) times daily before a meal. Patient not taking: Reported on 06/10/2016 06/08/16   Cassidy Mikhail, DO  sucralfate (CARAFATE) 1 g tablet Take 1 tablet (1 g total) by mouth 4 (four) times daily. Patient not taking: Reported on 06/10/2016 06/08/16   Nita [name] Mikhail, DO  traZODone (DESYREL) 50 MG tablet Take 0.5 tablets (25 mg total) by mouth at bedtime as needed for sleep. Patient not taking: Reported on 06/10/2016 05/12/16   Richarda Overlie, MD    Physical Exam:    Vitals:   06/10/16 0900 06/10/16 0915 06/10/16 0951 06/10/16 1015  BP: (!) 131/44 (!) 138/47 (!) 142/48 (!) 149/44  Pulse: (!) 53 (!) 53 (!) 56 63  Resp: 11 11 19 20   Temp:      TempSrc:      SpO2: 97% 96% 97% 100%       Constitutional: NAD,  mildly anxious, unkempt  Vitals:   06/10/16 0900 06/10/16 0915 06/10/16 0951 06/10/16 1015  BP: (!) 131/44 (!) 138/47 (!) 142/48 (!) 149/44  Pulse: (!) 53 (!) 53 (!) 56 63  Resp: 11 11 19 20   Temp:      TempSrc:      SpO2: 97% 96% 97% 100%   Eyes: PERRL, lids and conjunctivae normal ENMT: Mucous membranes are moist.  Posterior pharynx clear of any exudate or lesions.multiple missing teeth Neck: normal, supple, no masses, no thyromegaly Respiratory: clear to auscultation bilaterally, no wheezing, no crackles. Normal respiratory effort. No accessory muscle use.  Cardiovascular: bradycardic, regular , no murmurs / rubs / gallops. No extremity edema. 2+ pedal pulses. No carotid bruits.  Abdomen:tender in the lower abdomen, chronic  no masses palpated. No hepatosplenomegaly. Bowel sounds positive.  Musculoskeletal: no clubbing / cyanosis. No joint deformity upper and lower extremities. Good ROM, no contractures. Normal muscle tone.  Skin: no rashes, lesions, ulcers.  Neurologic: CN 2-12 grossly  intact. Sensation intact, DTR normal. Strength 5/5 in all 4.  Psychiatric: Normal judgment and insight. Alert and oriented x 3. Anxious  mood.     Labs on Admission: I have personally reviewed following labs and imaging studies  CBC:  Recent Labs Lab 06/06/16 0459 06/07/16 0548 06/08/16 0739 06/10/16 0840  WBC 12.7* 9.6  --  9.0  NEUTROABS  --   --   --  5.3  HGB 13.5 11.8* 13.1 11.9*  HCT 39.9 36.1 39.3 35.2*  MCV 93.9 94.5  --  92.1  PLT 338 291  --  294    Basic Metabolic Panel:  Recent Labs Lab 06/06/16 0459 06/07/16 0548 06/10/16 0840  NA 139 139 140  K 5.0 4.2 4.1  CL 105 110 107  CO2 24 22 24   GLUCOSE 99 83 96  BUN 19 13 15   CREATININE 0.98 0.85 0.90  CALCIUM 9.9 8.9 9.4    GFR: Estimated Creatinine Clearance: 41.2 mL/min (by C-G formula based on SCr of 0.9 mg/dL).  Liver Function Tests:  Recent Labs Lab 06/06/16 0459 06/07/16 0548 06/10/16 0840  AST 19 15 21   ALT 16 12* 14  ALKPHOS 64 55 55  BILITOT 0.6 0.7 0.5  PROT 6.9 5.7* 6.3*  ALBUMIN 3.9 3.0* 3.4*    Recent Labs Lab 06/06/16 0459  LIPASE 63*   No results for input(s): AMMONIA in the last 168 hours.  Coagulation Profile: No results for input(s): INR, PROTIME in the last 168 hours.  Cardiac Enzymes:  Recent  Labs Lab 06/10/16 0840  TROPONINI <0.03    BNP (last 3 results) No results for input(s): PROBNP in the last 8760 hours.  HbA1C: No results for input(s): HGBA1C in the last 72 hours.  CBG:  Recent Labs Lab 06/07/16 1153 06/07/16 1711 06/07/16 2207 06/08/16 0750 06/08/16 1216  GLUCAP 149* 120* 111* 87 121*    Lipid Profile: No results for input(s): CHOL, HDL, LDLCALC, TRIG, CHOLHDL, LDLDIRECT in the last 72 hours.  Thyroid Function Tests: No results for input(s): TSH, T4TOTAL, FREET4, T3FREE, THYROIDAB in the last 72 hours.  Anemia Panel: No results for input(s): VITAMINB12, FOLATE, FERRITIN, TIBC, IRON, RETICCTPCT in the last 72 hours.  Urine analysis:    Component Value Date/Time   COLORURINE YELLOW 06/10/2016 0811   APPEARANCEUR CLEAR 06/10/2016 0811   LABSPEC 1.009 06/10/2016 0811   PHURINE 5.5 06/10/2016 0811   GLUCOSEU NEGATIVE 06/10/2016 0811   HGBUR SMALL (A) 06/10/2016 0811   BILIRUBINUR NEGATIVE 06/10/2016 0811   KETONESUR NEGATIVE 06/10/2016 0811   PROTEINUR NEGATIVE 06/10/2016 0811   NITRITE NEGATIVE 06/10/2016 0811   LEUKOCYTESUR NEGATIVE 06/10/2016 0811    Sepsis Labs: @LABRCNTIP (procalcitonin:4,lacticidven:4) ) Recent Results (from the past 240 hour(s))  Urine culture     Status: Abnormal   Collection Time: 06/06/16  4:58 AM  Result Value Ref Range Status   Specimen Description URINE, RANDOM  Final   Special Requests NONE  Final   Culture MULTIPLE SPECIES PRESENT, SUGGEST RECOLLECTION (A)  Final   Report Status 06/07/2016 FINAL  Final  Gastrointestinal Panel by PCR , Stool     Status: None   Collection Time: 06/06/16  7:30 AM  Result Value Ref Range Status   Campylobacter species NOT DETECTED NOT DETECTED Final   Plesimonas shigelloides NOT DETECTED NOT DETECTED Final   Salmonella species NOT DETECTED NOT DETECTED Final   Yersinia enterocolitica NOT DETECTED NOT DETECTED Final   Vibrio species NOT DETECTED NOT DETECTED Final   Vibrio  cholerae NOT DETECTED NOT DETECTED Final   Enteroaggregative E coli (EAEC) NOT DETECTED NOT DETECTED Final   Enteropathogenic E coli (EPEC) NOT DETECTED NOT DETECTED Final   Enterotoxigenic E coli (ETEC) NOT DETECTED NOT DETECTED Final   Shiga like toxin producing E coli (STEC) NOT DETECTED NOT DETECTED Final   Shigella/Enteroinvasive E coli (EIEC) NOT DETECTED NOT DETECTED Final   Cryptosporidium NOT DETECTED NOT DETECTED Final   Cyclospora cayetanensis NOT DETECTED NOT DETECTED Final   Entamoeba histolytica NOT DETECTED NOT DETECTED Final   Giardia lamblia NOT DETECTED NOT DETECTED Final   Adenovirus F40/41 NOT DETECTED NOT DETECTED Final   Astrovirus NOT DETECTED NOT DETECTED Final   Norovirus GI/GII NOT DETECTED NOT DETECTED Final   Rotavirus A NOT DETECTED NOT DETECTED Final   Sapovirus (I, II, IV, and V) NOT DETECTED NOT DETECTED Final  Urine culture     Status: Abnormal   Collection Time: 06/07/16  4:58 AM  Result Value Ref Range Status   Specimen Description URINE, RANDOM  Final   Special Requests NONE  Final   Culture MULTIPLE SPECIES PRESENT, SUGGEST RECOLLECTION (A)  Final   Report Status 06/08/2016 FINAL  Final     Radiological Exams on Admission: No results found.  EKG: Independently reviewed.  Assessment/Plan Active Problems:   Dizziness   Bradycardia   Intractable abdominal pain   Essential hypertension   Bradycardia. Patient reports a  history of same, no apparent Cards evaluation in the past  . K 4. EKG confirms rate 46, but since admsion has been improving at 53 currently. Patient not on BB. LAst 2 D echo 04/2016 showed normal LV function, moderate LVH, EF 55-60%, normal wall motion, grade 1 diastolic dysfunction. BUN/ CR unremarkable.   -Admit to tele obs  -Gentle IV fluids at 75 cc/h  Repeat EKG  Monitor electrolytes Check TSH  Will consider Cardiology consult if signs continue   Dizziness: Etiology unknown, ?secondary to above Patient is nauseous, weak,  and was unable to stand due to gait instability. Last CT head and 2 D echo  in 04/2016 negative  - Telemetry, observation - Meclizine, Zofran  when necessary dizziness - IVF  - Neurology consult if not improving in a.m. - orthostatics   Hypertension BP (!) 149/44   Pulse 63   Temp 98.7 F (37.1 C) (Oral)   Resp 20   SpO2 100%  Controlled Hold any anti-hypertensive medications due to dizziness   Depression Continue Zoloft  Anxiety Continue Desyrel   DVT prophylaxis: Lovenox  Code Status:   Full    Family Communication:  Discussed with patient Disposition Plan: Expect patient to be discharged to SNF  after condition improves Consults called:   Cards  Admission status:Tele  Obs     Select Specialty HospitalWERTMAN,Andrey Mccaskill E, PA-C [facility]   06/10/2016, 11:21 AM

## 2016-06-10 NOTE — ED Notes (Signed)
Admitting MD at bedside.

## 2016-06-10 NOTE — ED Provider Notes (Signed)
This an 82 year old female with a history of abdominal pain who was recently seen and admitted for abdominal pain. She was discharged returns today due to ongoing abdominal pain and dizziness. Here she is noted to have a bradycardia with a heart rate in the 40s. Orthostatics were positive with decrease in blood pressure by greater than 10 mmHg an increased heart rate patient is not on any medications which should obviously cause her heart rate to be low. Plan admission for further evaluation.  EKG Interpretation  Date/Time:  Sunday June 10 2016 07:53:36 EST Ventricular Rate:  47 PR Interval:    QRS Duration: 92 QT Interval:  488 QTC Calculation: 432 R Axis:   46 Text Interpretation:  Sinus bradycardia Confirmed by Jaeda Bruso MD, Duwayne HeckANIELLE [phone]([id number]) on 06/10/2016 8:24:46 AM        I performed a history and physical examination of Cassidy [name] and discussed her management with Ms. Margarette AsalGibbons.  I agree with the history, physical, assessment, and plan of care, with the following exceptions: None  I was present for the following procedures: None Time Spent in Critical Care of the patient: None Time spent in discussions with the patient and family: 10  Ruchy Wildrick Hilario QuarryS     Juel Bellerose, MD 06/10/16 1343

## 2016-06-10 NOTE — Progress Notes (Signed)
Patient arrived the unit from the ER on a stretcher, assesment completed see flowsheet placed on tele ccmd notified, patient oriented to room and staff, bed in lowest position, call light within reach will continue to monitor

## 2016-06-10 NOTE — ED Triage Notes (Signed)
Patient is [facility]. [facility].  Patient with abdominal pain, no nausea, no vomiting, no diarrhea. Patient recently discharged from our facility on Friday for same.  VSS.

## 2016-06-11 ENCOUNTER — Encounter (HOSPITAL_COMMUNITY): Payer: Self-pay | Admitting: Cardiology

## 2016-06-11 DIAGNOSIS — R001 Bradycardia, unspecified: Secondary | ICD-10-CM | POA: Diagnosis present

## 2016-06-11 DIAGNOSIS — I951 Orthostatic hypotension: Secondary | ICD-10-CM

## 2016-06-11 DIAGNOSIS — R109 Unspecified abdominal pain: Secondary | ICD-10-CM | POA: Diagnosis present

## 2016-06-11 DIAGNOSIS — R197 Diarrhea, unspecified: Secondary | ICD-10-CM | POA: Diagnosis present

## 2016-06-11 DIAGNOSIS — Z23 Encounter for immunization: Secondary | ICD-10-CM | POA: Diagnosis present

## 2016-06-11 DIAGNOSIS — I10 Essential (primary) hypertension: Secondary | ICD-10-CM | POA: Diagnosis present

## 2016-06-11 DIAGNOSIS — Z8711 Personal history of peptic ulcer disease: Secondary | ICD-10-CM | POA: Diagnosis not present

## 2016-06-11 DIAGNOSIS — K259 Gastric ulcer, unspecified as acute or chronic, without hemorrhage or perforation: Secondary | ICD-10-CM | POA: Diagnosis present

## 2016-06-11 DIAGNOSIS — R42 Dizziness and giddiness: Secondary | ICD-10-CM

## 2016-06-11 DIAGNOSIS — Z888 Allergy status to other drugs, medicaments and biological substances status: Secondary | ICD-10-CM | POA: Diagnosis not present

## 2016-06-11 DIAGNOSIS — K5730 Diverticulosis of large intestine without perforation or abscess without bleeding: Secondary | ICD-10-CM | POA: Diagnosis present

## 2016-06-11 DIAGNOSIS — F329 Major depressive disorder, single episode, unspecified: Secondary | ICD-10-CM | POA: Diagnosis present

## 2016-06-11 DIAGNOSIS — F419 Anxiety disorder, unspecified: Secondary | ICD-10-CM | POA: Diagnosis present

## 2016-06-11 DIAGNOSIS — Z87891 Personal history of nicotine dependence: Secondary | ICD-10-CM | POA: Diagnosis not present

## 2016-06-11 DIAGNOSIS — Z79899 Other long term (current) drug therapy: Secondary | ICD-10-CM | POA: Diagnosis not present

## 2016-06-11 DIAGNOSIS — K5900 Constipation, unspecified: Secondary | ICD-10-CM | POA: Diagnosis not present

## 2016-06-11 DIAGNOSIS — M1990 Unspecified osteoarthritis, unspecified site: Secondary | ICD-10-CM | POA: Diagnosis not present

## 2016-06-11 DIAGNOSIS — R531 Weakness: Secondary | ICD-10-CM | POA: Diagnosis present

## 2016-06-11 LAB — CBC
HCT: 35.7 % — ABNORMAL LOW (ref 36.0–46.0)
Hemoglobin: 12 g/dL (ref 12.0–15.0)
MCH: 31.4 pg (ref 26.0–34.0)
MCHC: 33.6 g/dL (ref 30.0–36.0)
MCV: 93.5 fL (ref 78.0–100.0)
PLATELETS: 289 10*3/uL (ref 150–400)
RBC: 3.82 MIL/uL — ABNORMAL LOW (ref 3.87–5.11)
RDW: 12.6 % (ref 11.5–15.5)
WBC: 9.3 10*3/uL (ref 4.0–10.5)

## 2016-06-11 LAB — COMPREHENSIVE METABOLIC PANEL
ALBUMIN: 3 g/dL — AB (ref 3.5–5.0)
ALK PHOS: 50 U/L (ref 38–126)
ALT: 17 U/L (ref 14–54)
AST: 21 U/L (ref 15–41)
Anion gap: 6 (ref 5–15)
BUN: 15 mg/dL (ref 6–20)
CALCIUM: 8.7 mg/dL — AB (ref 8.9–10.3)
CHLORIDE: 109 mmol/L (ref 101–111)
CO2: 25 mmol/L (ref 22–32)
CREATININE: 0.9 mg/dL (ref 0.44–1.00)
GFR calc non Af Amer: 59 mL/min — ABNORMAL LOW (ref >60)
GLUCOSE: 107 mg/dL — AB (ref 65–99)
Potassium: 4 mmol/L (ref 3.5–5.1)
SODIUM: 140 mmol/L (ref 135–145)
Total Bilirubin: 0.3 mg/dL (ref 0.3–1.2)
Total Protein: 5.6 g/dL — ABNORMAL LOW (ref 6.5–8.1)

## 2016-06-11 MED ORDER — SODIUM CHLORIDE 0.9 % IV BOLUS (SEPSIS)
500.0000 mL | Freq: Once | INTRAVENOUS | Status: AC
  Administered 2016-06-11: 500 mL via INTRAVENOUS

## 2016-06-11 NOTE — Progress Notes (Signed)
[name]    Progress Note  TATIANNA [name]  ZOX:[id number] DOB: Nov 18, 1933 DOA: 06/10/2016 PCP: No primary care provider on file.     Brief Narrative:   Cassidy [name] is an 82 y.o. female past medical history of chronic abdominal pain with recent hospitalization status post an EGD with ulcer comes in with several complaints of dizziness and lightheadedness with walking and going from the laying to standing position. She also relates chest pain with exertion which is intermittently. The patient changing her history she relates she doesn't have chest pain,  Then she relates she only has chest pain and dizziness when she walks. She denies any abdominal pain she is on Protonix and sucralfate her hemoglobin is stable she denies any melanotic stools or bright red blood per rectum.  Assessment/Plan:   Orthostatic hypotension/ Dizziness/Bradycardia Orthostatics were positive on admission and she was started on IV fluids, will check orthostatics again today. She states that she is dizzy with weak mass and some chest pain when she tries to get to the bathroom. Her heart rate has been in the 40s question if this is contributing to her symptomatology. She had a previous similar symptomatology back in October 21, Aricept was stopped and her bradycardia improved. But she continues to be bradycardic despite this. I will go ahead and consult cardiology.  Essential hypertension She is off all her antihypertensive medications.   Depression: Continue Zoloft.  Anxiety: Desrel  DVT prophylaxis: lovenoxc Family Communication:None Disposition Plan/Barrier to D/C: unable to detetrmine Code Status:     Code Status Orders        Start     Ordered   06/10/16 1126  Full code  Continuous     06/10/16 1129    Code Status History    Date Active Date Inactive Code Status Order ID Comments User Context   06/06/2016 11:04 AM 06/08/2016 10:04 PM Full Code [id number]  Marcos Eke, PA-C ED   05/10/2016 10:47 AM 05/12/2016  6:35 PM Full Code [id number]  Ozella Rocks, MD Inpatient   05/10/2016 10:47 AM 05/10/2016 10:47 AM Full Code [id number]  Ozella Rocks, MD Inpatient        IV Access:    Peripheral IV   Procedures and diagnostic studies:   No results found.   Medical Consultants:    None.  Anti-Infectives:   No  Subjective:    Cassidy [name] Still symptomatic   Objective:    Vitals:   06/10/16 1500 06/10/16 1900 06/10/16 2051 06/11/16 0534  BP:   (!) 138/50 (!) 138/44  Pulse:   64 (!) 46  Resp:   20 20  Temp:   98.6 F (37 C) 97.8 F (36.6 C)  TempSrc:   Oral Oral  SpO2:   99% 100%  Weight:  59.4 kg (130 lb 14.4 oz)  59.1 kg (130 lb 6.4 oz)  Height: 5\' 3"  (1.6 m)       Intake/Output Summary (Last 24 hours) at 06/11/16 0707 Last data filed at 06/11/16 0538  Gross per 24 hour  Intake             1920 ml  Output                0 ml  Net             1920 ml   Filed Weights   06/10/16 1900 06/11/16 0534  Weight: 59.4 kg (130 lb 14.4 oz) 59.1  kg (130 lb 6.4 oz)    Exam: General exam: In no acute distress. Respiratory system: Good air movement and clear to auscultation. Cardiovascular system: S1 & S2 heard, RRR. No JVD. Gastrointestinal system: Abdomen is nondistended, soft and nontender.  Central nervous system: Alert and oriented. No focal neurological deficits. Extremities: No pedal edema. Skin: No rashes, lesions or ulcers Psychiatry: Judgement and insight appear normal. Mood & affect appropriate.    Data Reviewed:    Labs: Basic Metabolic Panel:  Recent Labs Lab 06/06/16 0459 06/07/16 0548 06/10/16 0840 06/11/16 0242  NA 139 139 140 140  K 5.0 4.2 4.1 4.0  CL 105 110 107 109  CO2 24 22 24 25   GLUCOSE 99 83 96 107*  BUN 19 13 15 15   CREATININE 0.98 0.85 0.90 0.90  CALCIUM 9.9 8.9 9.4 8.7*   GFR Estimated Creatinine Clearance: 41.2 mL/min (by C-G formula based on SCr of 0.9 mg/dL). Liver  Function Tests:  Recent Labs Lab 06/06/16 0459 06/07/16 0548 06/10/16 0840 06/11/16 0242  AST 19 15 21 21   ALT 16 12* 14 17  ALKPHOS 64 55 55 50  BILITOT 0.6 0.7 0.5 0.3  PROT 6.9 5.7* 6.3* 5.6*  ALBUMIN 3.9 3.0* 3.4* 3.0*    Recent Labs Lab 06/06/16 0459  LIPASE 63*   No results for input(s): AMMONIA in the last 168 hours. Coagulation profile No results for input(s): INR, PROTIME in the last 168 hours.  CBC:  Recent Labs Lab 06/06/16 0459 06/07/16 0548 06/08/16 0739 06/10/16 0840 06/11/16 0242  WBC 12.7* 9.6  --  9.0 9.3  NEUTROABS  --   --   --  5.3  --   HGB 13.5 11.8* 13.1 11.9* 12.0  HCT 39.9 36.1 39.3 35.2* 35.7*  MCV 93.9 94.5  --  92.1 93.5  PLT 338 291  --  294 289   Cardiac Enzymes:  Recent Labs Lab 06/10/16 0840  TROPONINI <0.03   BNP (last 3 results) No results for input(s): PROBNP in the last 8760 hours. CBG:  Recent Labs Lab 06/07/16 1153 06/07/16 1711 06/07/16 2207 06/08/16 0750 06/08/16 1216  GLUCAP 149* 120* 111* 87 121*   D-Dimer: No results for input(s): DDIMER in the last 72 hours. Hgb A1c: No results for input(s): HGBA1C in the last 72 hours. Lipid Profile: No results for input(s): CHOL, HDL, LDLCALC, TRIG, CHOLHDL, LDLDIRECT in the last 72 hours. Thyroid function studies:  Recent Labs  06/10/16 1230  TSH 3.421   Anemia work up: No results for input(s): VITAMINB12, FOLATE, FERRITIN, TIBC, IRON, RETICCTPCT in the last 72 hours. Sepsis Labs:  Recent Labs Lab 06/06/16 0459 06/07/16 0548 06/10/16 0840 06/11/16 0242  WBC 12.7* 9.6 9.0 9.3   Microbiology Recent Results (from the past 240 hour(s))  Urine culture     Status: Abnormal   Collection Time: 06/06/16  4:58 AM  Result Value Ref Range Status   Specimen Description URINE, RANDOM  Final   Special Requests NONE  Final   Culture MULTIPLE SPECIES PRESENT, SUGGEST RECOLLECTION (A)  Final   Report Status 06/07/2016 FINAL  Final  Gastrointestinal Panel by PCR  , Stool     Status: None   Collection Time: 06/06/16  7:30 AM  Result Value Ref Range Status   Campylobacter species NOT DETECTED NOT DETECTED Final   Plesimonas shigelloides NOT DETECTED NOT DETECTED Final   Salmonella species NOT DETECTED NOT DETECTED Final   Yersinia enterocolitica NOT DETECTED NOT DETECTED Final   Vibrio  species NOT DETECTED NOT DETECTED Final   Vibrio cholerae NOT DETECTED NOT DETECTED Final   Enteroaggregative E coli (EAEC) NOT DETECTED NOT DETECTED Final   Enteropathogenic E coli (EPEC) NOT DETECTED NOT DETECTED Final   Enterotoxigenic E coli (ETEC) NOT DETECTED NOT DETECTED Final   Shiga like toxin producing E coli (STEC) NOT DETECTED NOT DETECTED Final   Shigella/Enteroinvasive E coli (EIEC) NOT DETECTED NOT DETECTED Final   Cryptosporidium NOT DETECTED NOT DETECTED Final   Cyclospora cayetanensis NOT DETECTED NOT DETECTED Final   Entamoeba histolytica NOT DETECTED NOT DETECTED Final   Giardia lamblia NOT DETECTED NOT DETECTED Final   Adenovirus F40/41 NOT DETECTED NOT DETECTED Final   Astrovirus NOT DETECTED NOT DETECTED Final   Norovirus GI/GII NOT DETECTED NOT DETECTED Final   Rotavirus A NOT DETECTED NOT DETECTED Final   Sapovirus (I, II, IV, and V) NOT DETECTED NOT DETECTED Final  Urine culture     Status: Abnormal   Collection Time: 06/07/16  4:58 AM  Result Value Ref Range Status   Specimen Description URINE, RANDOM  Final   Special Requests NONE  Final   Culture MULTIPLE SPECIES PRESENT, SUGGEST RECOLLECTION (A)  Final   Report Status 06/08/2016 FINAL  Final  MRSA PCR Screening     Status: None   Collection Time: 06/10/16  3:47 PM  Result Value Ref Range Status   MRSA by PCR NEGATIVE NEGATIVE Final    Comment:        The GeneXpert MRSA Assay (FDA approved for NASAL specimens only), is one component of a comprehensive MRSA colonization surveillance program. It is not intended to diagnose MRSA infection nor to guide or monitor treatment  for MRSA infections.      Medications:   . acidophilus  1 capsule Oral Daily  . enoxaparin (LOVENOX) injection  40 mg Subcutaneous Q24H  . Influenza vac split quadrivalent PF  0.5 mL Intramuscular Tomorrow-1000  . pantoprazole  40 mg Oral BID AC  . sertraline  50 mg Oral QHS  . sodium chloride  500 mL Intravenous Once  . sodium chloride flush  3 mL Intravenous Q12H  . sucralfate  1 g Oral QID   Continuous Infusions: . sodium chloride 75 mL/hr at 06/11/16 0539    Time spent: 25 min   LOS: 0 days   Marinda ElkFELIZ ORTIZ, ABRAHAM  [facility] Pager [phone]  *Please refer to [email or web address], password [email or web address] to get updated schedule on who will round on this patient, as hospitalists switch teams weekly. If 7PM-7AM, please contact night-coverage at [email or web address], password [email or web address] for any overnight needs.  06/11/2016, 7:07 AM

## 2016-06-11 NOTE — Consult Note (Signed)
CARDIOLOGY CONSULT NOTE  Patient ID: Cassidy [name] MRN: [id number] DOB/AGE: 03-03-1934 82 y.o.  Admit date: 06/10/2016 Primary Physician None Primary Cardiologist Dr. Ladona Ridgel Chief Complaint  Abdominal pain. Requesting  Dr. Robb Matar.   HPI:  The patient presented to the ED yesterday with abdominal pain.  She was noted to have bradycardia and to have orthostatic BP readings.  We are consulted for these findings as well as dizziness.  Of note she was seen by [place] in consultation last month for the same symptoms.   She had a normal 2D echo.  A Lexiscan Myoview was negative for ischemia. Her EF was normal and there was questionable old apical scar.   Aricept was discontinued as a possible component of her bradycardia.   She presents with abdominal complaints and diarrhea.  She was apparently having dizziness.  She denies any chest pain when I ask her.  She has not had syncope or presyncope.  The patient denies any new symptoms such as neck or arm discomfort. There has been no new shortness of breath, PND or orthopnea. There have been no reported palpitations, presyncope or syncope.  Past Medical History:  Diagnosis Date  . Arthritis   . Diarrhea 05/2016  . HTN (hypertension)     Past Surgical History:  Procedure Laterality Date  . ABDOMINAL HYSTERECTOMY    . COLONOSCOPY WITH PROPOFOL N/A 06/06/2016   Procedure: COLONOSCOPY WITH PROPOFOL;  Surgeon: Bernette Redbird, MD;  Location: [facility] ENDOSCOPY;  Service: Endoscopy;  Laterality: N/A;  . ESOPHAGOGASTRODUODENOSCOPY (EGD) WITH PROPOFOL N/A 06/06/2016   Procedure: ESOPHAGOGASTRODUODENOSCOPY (EGD) WITH PROPOFOL;  Surgeon: Bernette Redbird, MD;  Location: [facility] ENDOSCOPY;  Service: Endoscopy;  Laterality: N/A;  . TOTAL KNEE ARTHROPLASTY    . TUBAL LIGATION      Allergies  Allergen Reactions  . Ciprofloxacin Hcl Other (See Comments)    Reaction: unknown   . Prednisone Other (See Comments)    Reaction: unknown   . Zantac [Ranitidine] Other (See  Comments)    Reaction: unknown  "too strong"    Prescriptions Prior to Admission  Medication Sig Dispense Refill Last Dose  . Multiple Vitamin (MULTIVITAMIN WITH MINERALS) TABS tablet Take 1 tablet by mouth daily.   06/09/2016 at Unknown time  . Probiotic Product (RISA-BID PROBIOTIC) TABS Take 1 tablet by mouth daily.   06/09/2016 at Unknown time  . sertraline (ZOLOFT) 50 MG tablet Take 50 mg by mouth at bedtime.    Past Week at Unknown time  . lactobacillus acidophilus (BACID) TABS tablet Take 1 tablet by mouth daily. For 30 days (Patient not taking: Reported on 06/10/2016) 29 tablet 0 Not Taking at Unknown time  . meclizine (ANTIVERT) 25 MG tablet Take 1 tablet (25 mg total) by mouth 3 (three) times daily as needed for dizziness. (Patient not taking: Reported on 06/10/2016) 30 tablet 0 Not Taking at Unknown time  . pantoprazole (PROTONIX) 40 MG tablet Take 1 tablet (40 mg total) by mouth 2 (two) times daily before a meal. (Patient not taking: Reported on 06/10/2016) 30 tablet 0 Not Taking at Unknown time  . sucralfate (CARAFATE) 1 g tablet Take 1 tablet (1 g total) by mouth 4 (four) times daily. (Patient not taking: Reported on 06/10/2016) 120 tablet 0 Not Taking at Unknown time  . traZODone (DESYREL) 50 MG tablet Take 0.5 tablets (25 mg total) by mouth at bedtime as needed for sleep. (Patient not taking: Reported on 06/10/2016) 30 tablet 0 Not Taking at Unknown time   Family  History  Problem Relation Age of Onset  . Adopted: Yes    Social History   Social History  . Marital status: Married    Spouse name: N/A  . Number of children: N/A  . Years of education: N/A   Occupational History  . Not on file.   Social History Main Topics  . Smoking status: Former Smoker    Packs/day: 1.00    Years: 10.00    Types: Cigarettes    Quit date: 05/01/2016  . Smokeless tobacco: Never Used  . Alcohol use No  . Drug use: No  . Sexual activity: Not on file   Other Topics Concern  . Not on  file   Social History Narrative  . No narrative on file     ROS:    As stated in the HPI and negative for all other systems.  Physical Exam: Blood pressure (!) 138/44, pulse (!) 46, temperature 97.8 F (36.6 C), temperature source Oral, resp. rate 20, height 5\' 3"  (1.6 m), weight 130 lb 6.4 oz (59.1 kg), SpO2 100 %.  GENERAL:  Well appearing HEENT:  Pupils equal round and reactive, fundi not visualized, oral mucosa unremarkable NECK:  No jugular venous distention, waveform within normal limits, carotid upstroke brisk and symmetric, no bruits, no thyromegaly LYMPHATICS:  No cervical, inguinal adenopathy LUNGS:  Clear to auscultation bilaterally BACK:  No CVA tenderness CHEST:  Unremarkable HEART:  PMI not displaced or sustained,S1 and S2 within normal limits, no S3, no S4, no clicks, no rubs, no murmurs ABD:  Flat, positive bowel sounds normal in frequency in pitch, no bruits, no rebound, no guarding, no midline pulsatile mass, no hepatomegaly, no splenomegaly EXT:  2 plus pulses throughout, no edema, no cyanosis no clubbing SKIN:  No rashes no nodules NEURO:  Cranial nerves II through XII grossly intact, motor grossly intact throughout PSYCH:  Cognitively intact, oriented to person place and time  Labs: Lab Results  Component Value Date   BUN 15 06/11/2016   Lab Results  Component Value Date   CREATININE 0.90 06/11/2016   Lab Results  Component Value Date   NA 140 06/11/2016   K 4.0 06/11/2016   CL 109 06/11/2016   CO2 25 06/11/2016   Lab Results  Component Value Date   TROPONINI <0.03 06/10/2016   Lab Results  Component Value Date   WBC 9.3 06/11/2016   HGB 12.0 06/11/2016   HCT 35.7 (L) 06/11/2016   MCV 93.5 06/11/2016   PLT 289 06/11/2016   Lab Results  Component Value Date   CHOL 205 (H) 05/11/2016   HDL 33 (L) 05/11/2016   LDLCALC 143 (H) 05/11/2016   TRIG 146 05/11/2016   CHOLHDL 6.2 05/11/2016   Lab Results  Component Value Date   ALT 17  06/11/2016   AST 21 06/11/2016   ALKPHOS 50 06/11/2016   BILITOT 0.3 06/11/2016      EKG:  SB, rate 46, axis WNL, intervals WNL.  No acute ST T wave changes.   06/11/2016   ASSESSMENT AND PLAN:   DIZZINESS:  This seems to be improved with hydration.  Her orthostatic BPs were improved this morning.  No further work up.   BRADYCARDIA:   She has rates in the 50s on tele and no sustained pauses.  I will ask that she be walked around and make sure her heart rate goes up.  However, with no sustained symptomatic bradyarrhythmias there would be no indication for a pacemaker.  ORTHOSTATIC HYPOTENSION:  Improved.    CHEST PAIN:   She denies this and had a negative perfusion study in Oct.  Signed: Rollene RotundaJames Jeslynn Hollander 06/11/2016, 1:59 PM

## 2016-06-11 NOTE — Care Management Obs Status (Signed)
MEDICARE OBSERVATION STATUS NOTIFICATION   Patient Details  Name: Cassidy [name] MRN: [id number] Date of Birth: 10/07/1933   Medicare Observation Status Notification Given:  Yes    Darrold SpanWebster, Jymir Dunaj Hall, RN 06/11/2016, 4:14 PM

## 2016-06-11 NOTE — Evaluation (Signed)
Occupational Therapy Evaluation and Discharge Patient Details Name: Cassidy BeardDolores R Rawe MRN: [id number] DOB: 08/04/1933 Today's Date: 06/11/2016    History of Present Illness 82 y.o.femaleRecently hospitalized on 10/19-10/21 for dizziness and chest pain with essentially negative workup, now presenting with 1 day history of intractable abdominal pain, nausea and severe diarrhea, with 5 stools at the ED. PMHx: Arthritis, HTN, TKA.   Clinical Impression   This 82 yo female admitted with above presents to acute OT at a Mod I level for mobility with RW and for basic ADLs at a RW level. No further OT needs and no PT needs identified, we will sign off.    Follow Up Recommendations  No OT follow up    Equipment Recommendations  None recommended by OT       Precautions / Restrictions Precautions Precautions: Fall Precaution Comments: uses RW  Restrictions Weight Bearing Restrictions: No      Mobility Bed Mobility Overal bed mobility: Independent                Transfers Overall transfer level: Modified independent Equipment used: Rolling walker (2 wheeled)                  Balance Overall balance assessment: Needs assistance Sitting-balance support: No upper extremity supported;Feet supported Sitting balance-Leahy Scale: Normal     Standing balance support: Bilateral upper extremity supported Standing balance-Leahy Scale: Poor Standing balance comment: Uses RW for ambulation, does not need support in standing                            ADL Overall ADL's : Modified independent                                                       Pertinent Vitals/Pain Pain Assessment: No/denies pain     Hand Dominance Right   Extremity/Trunk Assessment Upper Extremity Assessment Upper Extremity Assessment: Overall WFL for tasks assessed   Lower Extremity Assessment Lower Extremity Assessment: Overall WFL for tasks assessed        Communication Communication Communication: No difficulties   Cognition Arousal/Alertness: Awake/alert Behavior During Therapy: WFL for tasks assessed/performed Overall Cognitive Status: Within Functional Limits for tasks assessed                                Home Living Family/patient expects to be discharged to:: Assisted living                             Home Equipment: Walker - 4 wheels;Shower seat - built in;Grab bars - toilet;Grab bars - tub/shower   Additional Comments: [facility]. Has her own room/bathroom but takes meals at dining hall. Staff dispense meds      Prior Functioning/Environment Level of Independence: Independent with assistive device(s)        Comments: Rollator for mobility. Completes BADL independently. Meals provided by ALF in dining hall.             OT Goals(Current goals can be found in the care plan section) Acute Rehab OT Goals Patient Stated Goal: to walk again after lunch  OT Frequency:  End of Session Equipment Utilized During Treatment: Gait belt;Rolling walker Nurse Communication: Mobility status (and also made NT aware that pt would like to ambulate post lunch)  Activity Tolerance: Patient tolerated treatment well Patient left: in chair;with call bell/phone within reach;with chair alarm set   Time: [id number] OT Time Calculation (min): 32 min Charges:  OT General Charges $OT Visit: 1 Procedure OT Evaluation $OT Eval Moderate Complexity: 1 Procedure OT Treatments $Self Care/Home Management : 8-22 mins G-Codes: OT G-codes **NOT FOR INPATIENT CLASS** Functional Assessment Tool Used: Clinical judgement Functional Limitation: Self care Self Care Current Status (M5784(G8987): At least 1 percent but less than 20 percent impaired, limited or restricted Self Care Goal Status (O9629(G8988): At least 1 percent but less than 20 percent impaired, limited or restricted Self Care Discharge Status  [phone]): At least 1 percent but less than 20 percent impaired, limited or restricted  Evette GeorgesLeonard, Lysander Calixte Eva [phone] 06/11/2016, 1:51 PM

## 2016-06-12 NOTE — Progress Notes (Signed)
Order received to discharge patient.  Telemetry monitor removed.  CCMD notified.  PIV access removed.  Transportation arranged.

## 2016-06-12 NOTE — Care Management Note (Addendum)
Case Management Note Donn PieriniKristi Marsalis Beaulieu RN, BSN Unit 2W-Case Manager [phone]  Patient Details  Name: Iona BeardDolores R Mayers MRN: [id number] Date of Birth: 05/08/1934  Subjective/Objective:   Pt admitted with bradycardia                 Action/Plan: PTA pt lived at at [facility], CSW consulted for return to ALF- (pt likes to report that she is from homeless shelter- however she is from ALF and is not homeless)  Expected Discharge Date:    06/12/16              Expected Discharge Plan:  Assisted Living / Rest Home  In-House Referral:  Clinical Social Work  Discharge planning Services  CM Consult  Post Acute Care Choice:    Choice offered to:     DME Arranged:    DME Agency:     HH Arranged:    HH Agency:     Status of Service:  Completed, signed off  If discussed at [employer] Length of Stay Meetings, dates discussed:    Discharge Disposition: assisted living facility   Additional Comments:  Darrold SpanWebster, Shalik Sanfilippo Hall, RN 06/12/2016, 10:45 AM

## 2016-06-12 NOTE — NC FL2 (Signed)
[place] MEDICAID FL2 LEVEL OF CARE SCREENING TOOL     IDENTIFICATION  Patient Name: Cassidy [name] Birthdate: 12/12/1933 Sex: female Admission Date (Current Location): 06/10/2016  [facility] [place]      Facility and Address:         Provider Number:    Attending Physician Name and Address:  Marinda ElkAbraham Feliz Ortiz, MD  Relative Name and Phone Number:       Current Level of Care:   Recommended Level of Care:   Prior Approval Number:    Date Approved/Denied:   PASRR Number:    Discharge Plan:      Current Diagnoses: Patient Active Problem List   Diagnosis Date Noted  . Orthostatic hypotension 06/11/2016  . Intractable abdominal pain 06/06/2016  . Diarrhea 06/06/2016  . Leukocytosis 06/06/2016  . Essential hypertension   . Chest pain, rule out acute myocardial infarction 05/10/2016  . Dizziness 05/10/2016  . Bradycardia 05/10/2016  . Subacute confusional state 05/10/2016  . Adjustment disorder with other symptoms 05/10/2016    Orientation RESPIRATION BLADDER Height & Weight            Weight: 135 lb 3.2 oz (61.3 kg) Height:  5\' 3"  (160 cm)  BEHAVIORAL SYMPTOMS/MOOD NEUROLOGICAL BOWEL NUTRITION STATUS           AMBULATORY STATUS COMMUNICATION OF NEEDS Skin                               Personal Care Assistance Level of Assistance              Functional Limitations Info             SPECIAL CARE FACTORS FREQUENCY                       Contractures      Additional Factors Info                  Current Medications (06/12/2016):  This is the current hospital active medication list Current Facility-Administered Medications  Medication Dose Route Frequency Provider Last Rate Last Dose  . 0.9 %  sodium chloride infusion   Intravenous Continuous Marcos EkeSara E Wertman, PA-C 75 mL/hr at 06/11/16 1340 75 mL/hr at 06/11/16 1340  . acetaminophen (TYLENOL) tablet 650 mg  650 mg Oral Q6H PRN Marcos EkeSara E Wertman, PA-C       Or  .  acetaminophen (TYLENOL) suppository 650 mg  650 mg Rectal Q6H PRN Marcos EkeSara E Wertman, PA-C      . acidophilus (RISAQUAD) capsule 1 capsule  1 capsule Oral Daily Rhona RaiderJacob J Stinson, DO   1 capsule at 06/11/16 1023  . enoxaparin (LOVENOX) injection 40 mg  40 mg Subcutaneous Q24H Marcos EkeSara E Wertman, PA-C   40 mg at 06/11/16 1634  . meclizine (ANTIVERT) tablet 25 mg  25 mg Oral TID PRN Marcos EkeSara E Wertman, PA-C      . ondansetron [facility](ZOFRAN) tablet 4 mg  4 mg Oral Q6H PRN Marcos EkeSara E Wertman, PA-C       Or  . ondansetron [facility](ZOFRAN) injection 4 mg  4 mg Intravenous Q6H PRN Marcos EkeSara E Wertman, PA-C      . pantoprazole (PROTONIX) EC tablet 40 mg  40 mg Oral BID AC Marcos EkeSara E Wertman, PA-C   40 mg at 06/11/16 1633  . senna-docusate (Senokot-S) tablet 1 tablet  1 tablet Oral QHS PRN Marcos EkeSara E Wertman,  PA-C      . sertraline (ZOLOFT) tablet 50 mg  50 mg Oral QHS Marcos EkeSara E Wertman, PA-C   50 mg at 06/11/16 2212  . sodium chloride flush (NS) 0.9 % injection 3 mL  3 mL Intravenous Q12H Marcos EkeSara E Wertman, PA-C      . sucralfate (CARAFATE) tablet 1 g  1 g Oral QID Marcos EkeSara E Wertman, PA-C   1 g at 06/11/16 2212  . traZODone (DESYREL) tablet 25 mg  25 mg Oral QHS PRN Marcos EkeSara E Wertman, PA-C         Discharge Medications: Please see discharge summary for a list of discharge medications.  Relevant Imaging Results:  Relevant Lab Results:   Additional Information     Marrianne Moodshley Athleen Feltner, MSW,  [employer]CSWA [phone]

## 2016-06-12 NOTE — Clinical Social Work Note (Signed)
Clinical Social Worker facilitated patient discharge including contacting patient and facility to confirm patient discharge plans.  Clinical information faxed to facility and patient agreeable with plan.  CS[facility]  RN to call report prior to discharge.  Clinical Social Worker will sign off for now as social work intervention is no longer needed. Please consult us again if new need arises.  Macario GoldsJesse Anjulie Dipierro, [place] [phone]

## 2016-06-12 NOTE — [place] FL2 (Signed)
[place] MEDICAID FL2 LEVEL OF CARE SCREENING TOOL     IDENTIFICATION  Patient Name: Cassidy BeardDolores R Creary Birthdate: 07/30/1933 Sex: female Admission Date (Current Location): 06/10/2016  [facility] [place]   Facility and Address:  [facility], [address], [place], [place] [address]      Provider Number: [id number]  Attending Physician Name and Address:  Marinda ElkAbraham Feliz Ortiz, MD  Relative Name and Phone Number:       Current Level of Care: Hospital Recommended Level of Care: Assisted Living Facility Prior Approval Number:    Date Approved/Denied:   PASRR Number:    Discharge Plan: Other (Comment) (ALF)    Current Diagnoses: Patient Active Problem List   Diagnosis Date Noted  . Orthostatic hypotension 06/11/2016  . Intractable abdominal pain 06/06/2016  . Diarrhea 06/06/2016  . Leukocytosis 06/06/2016  . Essential hypertension   . Chest pain, rule out acute myocardial infarction 05/10/2016  . Dizziness 05/10/2016  . Bradycardia 05/10/2016  . Subacute confusional state 05/10/2016  . Adjustment disorder with other symptoms 05/10/2016    Orientation RESPIRATION BLADDER Height & Weight     Self, Time, Situation, Place  Normal Continent Weight: 135 lb 3.2 oz (61.3 kg) Height:  5\' 3"  (160 cm)  BEHAVIORAL SYMPTOMS/MOOD NEUROLOGICAL BOWEL NUTRITION STATUS      Continent Diet (Heart Healthy / Thin Liquids)  AMBULATORY STATUS COMMUNICATION OF NEEDS Skin   Independent Verbally Normal                       Personal Care Assistance Level of Assistance  Bathing, Feeding, Dressing Bathing Assistance: Limited assistance Feeding assistance: Independent Dressing Assistance: Independent     Functional Limitations Info  Sight, Hearing, Speech Sight Info: Adequate Hearing Info: Adequate Speech Info: Adequate    SPECIAL CARE FACTORS FREQUENCY                       Contractures      Additional Factors Info   Code Status, Allergies, Psychotropic Code Status Info: Full Code Allergies Info: Ciprofloxacin Hcl, Prednisone, Zantac Ranitidine Psychotropic Info: Zoloft         Current Medications (06/12/2016):  This is the current hospital active medication list Current Facility-Administered Medications  Medication Dose Route Frequency Provider Last Rate Last Dose  . 0.9 %  sodium chloride infusion   Intravenous Continuous Marcos EkeSara E Wertman, PA-C 75 mL/hr at 06/11/16 1340 75 mL/hr at 06/11/16 1340  . acetaminophen (TYLENOL) tablet 650 mg  650 mg Oral Q6H PRN Marcos EkeSara E Wertman, PA-C       Or  . acetaminophen (TYLENOL) suppository 650 mg  650 mg Rectal Q6H PRN Marcos EkeSara E Wertman, PA-C      . acidophilus (RISAQUAD) capsule 1 capsule  1 capsule Oral Daily Rhona RaiderJacob J Stinson, DO   1 capsule at 06/12/16 1316  . enoxaparin (LOVENOX) injection 40 mg  40 mg Subcutaneous Q24H Marcos EkeSara E Wertman, PA-C   40 mg at 06/11/16 1634  . meclizine (ANTIVERT) tablet 25 mg  25 mg Oral TID PRN Marcos EkeSara E Wertman, PA-C      . ondansetron [facility](ZOFRAN) tablet 4 mg  4 mg Oral Q6H PRN Marcos EkeSara E Wertman, PA-C       Or  . ondansetron [facility](ZOFRAN) injection 4 mg  4 mg Intravenous Q6H PRN Marcos EkeSara E Wertman, PA-C      . pantoprazole (PROTONIX) EC tablet 40 mg  40 mg  Oral BID AC Marcos EkeSara E Wertman, PA-C   40 mg at 06/11/16 1633  . senna-docusate (Senokot-S) tablet 1 tablet  1 tablet Oral QHS PRN Marcos EkeSara E Wertman, PA-C      . sertraline (ZOLOFT) tablet 50 mg  50 mg Oral QHS Marcos EkeSara E Wertman, PA-C   50 mg at 06/11/16 2212  . sodium chloride flush (NS) 0.9 % injection 3 mL  3 mL Intravenous Q12H Sung AmabileSara E Wertman, PA-C      . sucralfate (CARAFATE) tablet 1 g  1 g Oral QID Marcos EkeSara E Wertman, PA-C   1 g at 06/12/16 1317  . traZODone (DESYREL) tablet 25 mg  25 mg Oral QHS PRN Marcos EkeSara E Wertman, PA-C         Discharge Medications: Please see discharge summary for a list of discharge medications.  Relevant Imaging Results:  Relevant Lab Results:   Additional Information SSN  [id number]   Macario GoldsJesse Nahmir Zeidman, [place] [phone]

## 2016-06-12 NOTE — Discharge Summary (Signed)
Physician Discharge Summary  LAKEN ROG ZOX:[id number] DOB: Mar 11, 1934 DOA: 06/10/2016  PCP: No primary care provider on file.  Admit date: 06/10/2016 Discharge date: 06/12/2016  Admitted From: home Disposition:  Home  Recommendations for Outpatient Follow-up:  1. Follow up with PCP in 1-2 weeks 2. Please obtain BMP/CBC in one week   Home Health:yes Equipment/Devices:None  Discharge Condition:stable CODE STATUS:full Diet recommendation: Heart Healthy   Brief/Interim Summary:  82 y.o. female  with multiple hospitalizations, last from 11/15 to 11/17 for intractable abdominal pain and gastric ulcer, and acute on chronic diarrhea,now presenting today with similar GI symptoms with chronic abdominal pain, nausea without  diarrhea,  accompanied by dizziness without vertigo or headaches. No syncope or presyncope. She reports feeling very weak, especially when trying to seat or stand. She reports having similar symptoms in October and at the time had extesive negative workup, that included CT head, and Echo.  She denies any BB use. She denies any fever, chills or night sweats. No chest pain or shortness of breath.  Discharge Diagnoses:  Active Problems:   Dizziness   Bradycardia   Intractable abdominal pain   Essential hypertension   Orthostatic hypotension  Orthostatic hypotension/dizziness/bradycardia: Orthostatics were positive and she was started on IV fluid hydration for orthostatic hypotension resolved. She relates her chest pain resolved, she had a heart rate in the 40s, cardiology was consulted and they recommended no intervention at this time is asymptomatic improved.  Depression: Continue Zoloft.  All other medical problems were stable no changes were made. Discharge Instructions  Discharge Instructions    Diet - low sodium heart healthy    Complete by:  As directed    Increase activity slowly    Complete by:  As directed        Medication List    TAKE these  medications   lactobacillus acidophilus Tabs tablet Take 1 tablet by mouth daily. For 30 days What changed:  Another medication with the same name was removed. Continue taking this medication, and follow the directions you see here.   meclizine 25 MG tablet Commonly known as:  ANTIVERT Take 1 tablet (25 mg total) by mouth 3 (three) times daily as needed for dizziness.   multivitamin with minerals Tabs tablet Take 1 tablet by mouth daily.   pantoprazole 40 MG tablet Commonly known as:  PROTONIX Take 1 tablet (40 mg total) by mouth 2 (two) times daily before a meal.   sertraline 50 MG tablet Commonly known as:  ZOLOFT Take 50 mg by mouth at bedtime.   sucralfate 1 g tablet Commonly known as:  CARAFATE Take 1 tablet (1 g total) by mouth 4 (four) times daily.   traZODone 50 MG tablet Commonly known as:  DESYREL Take 0.5 tablets (25 mg total) by mouth at bedtime as needed for sleep.       Allergies  Allergen Reactions  . Ciprofloxacin Hcl Other (See Comments)    Reaction: unknown   . Prednisone Other (See Comments)    Reaction: unknown   . Zantac [Ranitidine] Other (See Comments)    Reaction: unknown  "too strong"     Consultations:  Cardiology   Procedures/Studies: Ct Abdomen Pelvis W Contrast  Result Date: 06/06/2016 CLINICAL DATA:  Constant diarrhea for 3 months EXAM: CT ABDOMEN AND PELVIS WITH CONTRAST TECHNIQUE: Multidetector CT imaging of the abdomen and pelvis was performed using the standard protocol following bolus administration of intravenous contrast. CONTRAST:  1 ISOVUE-300 IOPAMIDOL (ISOVUE-300) INJECTION 61% COMPARISON:  None. FINDINGS: Lower chest: Lung bases are clear. Hepatobiliary: No focal liver abnormality is seen. No gallstones, gallbladder wall thickening, or biliary dilatation. Pancreas: Unremarkable. No pancreatic ductal dilatation or surrounding inflammatory changes. Spleen: Normal in size without focal abnormality. Adrenals/Urinary Tract:  Adrenal glands are unremarkable. Kidneys are normal, without renal calculi, focal lesion, or hydronephrosis. Bladder is unremarkable. Stomach/Bowel: Focal severe thickening of the antrum of the stomach. Air-fluid levels throughout the colon consistent with diarrhea. No pneumatosis, pneumoperitoneum or portal venous gas. Diverticulosis of the sigmoid colon without evidence of diverticulitis. Vascular/Lymphatic: Normal caliber abdominal aorta with atherosclerosis. No lymphadenopathy. Reproductive: Status post hysterectomy. No adnexal masses. Other: No abdominal wall hernia or abnormality. No abdominopelvic ascites. Musculoskeletal: No lytic or sclerotic osseous lesion. Degenerative disc disease with disc height loss at L5-S1 with bilateral facet arthropathy. Bilateral foraminal narrowing at L5-S1. IMPRESSION: 1. Focal severe thickening of the antrum of the stomach. The appearance is concerning for malignancy versus severe gastritis. Recommend further evaluation with upper endoscopy. 2. Air-fluid levels throughout the colon consistent with diarrhea. Electronically Signed   By: Elige Ko   On: 06/06/2016 09:37       Subjective: Symptoms are improved.  Discharge Exam: Vitals:   06/11/16 2039 06/12/16 0453  BP: (!) 149/48 (!) 137/39  Pulse: (!) 56 (!) 51  Resp: 18 20  Temp: 98.3 F (36.8 C) 98.4 F (36.9 C)   Vitals:   06/11/16 0534 06/11/16 1410 06/11/16 2039 06/12/16 0453  BP: (!) 138/44 99/80 (!) 149/48 (!) 137/39  Pulse: (!) 46 65 (!) 56 (!) 51  Resp: 20 18 18 20   Temp: 97.8 F (36.6 C)  98.3 F (36.8 C) 98.4 F (36.9 C)  TempSrc: Oral  Oral Oral  SpO2: 100% 99% 100% 98%  Weight: 59.1 kg (130 lb 6.4 oz)   61.3 kg (135 lb 3.2 oz)  Height:        General: Pt is alert, awake, not in acute distress Cardiovascular: RRR, S1/S2 +, no rubs, no gallops Respiratory: CTA bilaterally, no wheezing, no rhonchi Abdominal: Soft, NT, ND, bowel sounds + Extremities: no edema, no  cyanosis    The results of significant diagnostics from this hospitalization (including imaging, microbiology, ancillary and laboratory) are listed below for reference.     Microbiology: Recent Results (from the past 240 hour(s))  Urine culture     Status: Abnormal   Collection Time: 06/06/16  4:58 AM  Result Value Ref Range Status   Specimen Description URINE, RANDOM  Final   Special Requests NONE  Final   Culture MULTIPLE SPECIES PRESENT, SUGGEST RECOLLECTION (A)  Final   Report Status 06/07/2016 FINAL  Final  Gastrointestinal Panel by PCR , Stool     Status: None   Collection Time: 06/06/16  7:30 AM  Result Value Ref Range Status   Campylobacter species NOT DETECTED NOT DETECTED Final   Plesimonas shigelloides NOT DETECTED NOT DETECTED Final   Salmonella species NOT DETECTED NOT DETECTED Final   Yersinia enterocolitica NOT DETECTED NOT DETECTED Final   Vibrio species NOT DETECTED NOT DETECTED Final   Vibrio cholerae NOT DETECTED NOT DETECTED Final   Enteroaggregative E coli (EAEC) NOT DETECTED NOT DETECTED Final   Enteropathogenic E coli (EPEC) NOT DETECTED NOT DETECTED Final   Enterotoxigenic E coli (ETEC) NOT DETECTED NOT DETECTED Final   Shiga like toxin producing E coli (STEC) NOT DETECTED NOT DETECTED Final   Shigella/Enteroinvasive E coli (EIEC) NOT DETECTED NOT DETECTED Final   Cryptosporidium NOT DETECTED  NOT DETECTED Final   Cyclospora cayetanensis NOT DETECTED NOT DETECTED Final   Entamoeba histolytica NOT DETECTED NOT DETECTED Final   Giardia lamblia NOT DETECTED NOT DETECTED Final   Adenovirus F40/41 NOT DETECTED NOT DETECTED Final   Astrovirus NOT DETECTED NOT DETECTED Final   Norovirus GI/GII NOT DETECTED NOT DETECTED Final   Rotavirus A NOT DETECTED NOT DETECTED Final   Sapovirus (I, II, IV, and V) NOT DETECTED NOT DETECTED Final  Urine culture     Status: Abnormal   Collection Time: 06/07/16  4:58 AM  Result Value Ref Range Status   Specimen Description  URINE, RANDOM  Final   Special Requests NONE  Final   Culture MULTIPLE SPECIES PRESENT, SUGGEST RECOLLECTION (A)  Final   Report Status 06/08/2016 FINAL  Final  MRSA PCR Screening     Status: None   Collection Time: 06/10/16  3:47 PM  Result Value Ref Range Status   MRSA by PCR NEGATIVE NEGATIVE Final    Comment:        The GeneXpert MRSA Assay (FDA approved for NASAL specimens only), is one component of a comprehensive MRSA colonization surveillance program. It is not intended to diagnose MRSA infection nor to guide or monitor treatment for MRSA infections.      Labs: BNP (last 3 results)  Recent Labs  09/14/15 1337  BNP 72.4   Basic Metabolic Panel:  Recent Labs Lab 06/06/16 0459 06/07/16 0548 06/10/16 0840 06/11/16 0242  NA 139 139 140 140  K 5.0 4.2 4.1 4.0  CL 105 110 107 109  CO2 24 22 24 25   GLUCOSE 99 83 96 107*  BUN 19 13 15 15   CREATININE 0.98 0.85 0.90 0.90  CALCIUM 9.9 8.9 9.4 8.7*   Liver Function Tests:  Recent Labs Lab 06/06/16 0459 06/07/16 0548 06/10/16 0840 06/11/16 0242  AST 19 15 21 21   ALT 16 12* 14 17  ALKPHOS 64 55 55 50  BILITOT 0.6 0.7 0.5 0.3  PROT 6.9 5.7* 6.3* 5.6*  ALBUMIN 3.9 3.0* 3.4* 3.0*    Recent Labs Lab 06/06/16 0459  LIPASE 63*   No results for input(s): AMMONIA in the last 168 hours. CBC:  Recent Labs Lab 06/06/16 0459 06/07/16 0548 06/08/16 0739 06/10/16 0840 06/11/16 0242  WBC 12.7* 9.6  --  9.0 9.3  NEUTROABS  --   --   --  5.3  --   HGB 13.5 11.8* 13.1 11.9* 12.0  HCT 39.9 36.1 39.3 35.2* 35.7*  MCV 93.9 94.5  --  92.1 93.5  PLT 338 291  --  294 289   Cardiac Enzymes:  Recent Labs Lab 06/10/16 0840  TROPONINI <0.03   BNP: Invalid input(s): POCBNP CBG:  Recent Labs Lab 06/07/16 1153 06/07/16 1711 06/07/16 2207 06/08/16 0750 06/08/16 1216  GLUCAP 149* 120* 111* 87 121*   D-Dimer No results for input(s): DDIMER in the last 72 hours. Hgb A1c No results for input(s): HGBA1C  in the last 72 hours. Lipid Profile No results for input(s): CHOL, HDL, LDLCALC, TRIG, CHOLHDL, LDLDIRECT in the last 72 hours. Thyroid function studies  Recent Labs  06/10/16 1230  TSH 3.421   Anemia work up No results for input(s): VITAMINB12, FOLATE, FERRITIN, TIBC, IRON, RETICCTPCT in the last 72 hours. Urinalysis    Component Value Date/Time   COLORURINE YELLOW 06/10/2016 0811   APPEARANCEUR CLEAR 06/10/2016 0811   LABSPEC 1.009 06/10/2016 0811   PHURINE 5.5 06/10/2016 0811   GLUCOSEU NEGATIVE 06/10/2016 [id number]  HGBUR SMALL (A) 06/10/2016 0811   BILIRUBINUR NEGATIVE 06/10/2016 0811   KETONESUR NEGATIVE 06/10/2016 0811   PROTEINUR NEGATIVE 06/10/2016 0811   NITRITE NEGATIVE 06/10/2016 0811   LEUKOCYTESUR NEGATIVE 06/10/2016 0811   Sepsis Labs Invalid input(s): PROCALCITONIN,  WBC,  LACTICIDVEN Microbiology Recent Results (from the past 240 hour(s))  Urine culture     Status: Abnormal   Collection Time: 06/06/16  4:58 AM  Result Value Ref Range Status   Specimen Description URINE, RANDOM  Final   Special Requests NONE  Final   Culture MULTIPLE SPECIES PRESENT, SUGGEST RECOLLECTION (A)  Final   Report Status 06/07/2016 FINAL  Final  Gastrointestinal Panel by PCR , Stool     Status: None   Collection Time: 06/06/16  7:30 AM  Result Value Ref Range Status   Campylobacter species NOT DETECTED NOT DETECTED Final   Plesimonas shigelloides NOT DETECTED NOT DETECTED Final   Salmonella species NOT DETECTED NOT DETECTED Final   Yersinia enterocolitica NOT DETECTED NOT DETECTED Final   Vibrio species NOT DETECTED NOT DETECTED Final   Vibrio cholerae NOT DETECTED NOT DETECTED Final   Enteroaggregative E coli (EAEC) NOT DETECTED NOT DETECTED Final   Enteropathogenic E coli (EPEC) NOT DETECTED NOT DETECTED Final   Enterotoxigenic E coli (ETEC) NOT DETECTED NOT DETECTED Final   Shiga like toxin producing E coli (STEC) NOT DETECTED NOT DETECTED Final   Shigella/Enteroinvasive E  coli (EIEC) NOT DETECTED NOT DETECTED Final   Cryptosporidium NOT DETECTED NOT DETECTED Final   Cyclospora cayetanensis NOT DETECTED NOT DETECTED Final   Entamoeba histolytica NOT DETECTED NOT DETECTED Final   Giardia lamblia NOT DETECTED NOT DETECTED Final   Adenovirus F40/41 NOT DETECTED NOT DETECTED Final   Astrovirus NOT DETECTED NOT DETECTED Final   Norovirus GI/GII NOT DETECTED NOT DETECTED Final   Rotavirus A NOT DETECTED NOT DETECTED Final   Sapovirus (I, II, IV, and V) NOT DETECTED NOT DETECTED Final  Urine culture     Status: Abnormal   Collection Time: 06/07/16  4:58 AM  Result Value Ref Range Status   Specimen Description URINE, RANDOM  Final   Special Requests NONE  Final   Culture MULTIPLE SPECIES PRESENT, SUGGEST RECOLLECTION (A)  Final   Report Status 06/08/2016 FINAL  Final  MRSA PCR Screening     Status: None   Collection Time: 06/10/16  3:47 PM  Result Value Ref Range Status   MRSA by PCR NEGATIVE NEGATIVE Final    Comment:        The GeneXpert MRSA Assay (FDA approved for NASAL specimens only), is one component of a comprehensive MRSA colonization surveillance program. It is not intended to diagnose MRSA infection nor to guide or monitor treatment for MRSA infections.     Time coordinating discharge: Over 30 minutes  SIGNED:   Marinda ElkFELIZ ORTIZ, ABRAHAM, MD  [facility] 06/12/2016, 7:57 AM Pager   If 7PM-7AM, please contact night-coverage [email or web address] Password [email or web address]

## 2016-10-15 ENCOUNTER — Encounter: Payer: Self-pay | Admitting: Obstetrics & Gynecology

## 2016-10-26 ENCOUNTER — Encounter: Payer: Self-pay | Admitting: Obstetrics & Gynecology

## 2016-10-26 ENCOUNTER — Emergency Department (HOSPITAL_COMMUNITY)
Admission: EM | Admit: 2016-10-26 | Discharge: 2016-10-26 | Disposition: A | Discharge status: Home / Self Care | Payer: Medicare HMO | Attending: Emergency Medicine | Admitting: Emergency Medicine

## 2016-10-26 ENCOUNTER — Encounter (HOSPITAL_COMMUNITY): Payer: Self-pay | Admitting: Emergency Medicine

## 2016-10-26 DIAGNOSIS — Z96659 Presence of unspecified artificial knee joint: Secondary | ICD-10-CM | POA: Diagnosis not present

## 2016-10-26 DIAGNOSIS — I10 Essential (primary) hypertension: Secondary | ICD-10-CM | POA: Diagnosis not present

## 2016-10-26 DIAGNOSIS — Z87891 Personal history of nicotine dependence: Secondary | ICD-10-CM | POA: Diagnosis not present

## 2016-10-26 DIAGNOSIS — N760 Acute vaginitis: Secondary | ICD-10-CM

## 2016-10-26 DIAGNOSIS — Z79899 Other long term (current) drug therapy: Secondary | ICD-10-CM | POA: Diagnosis not present

## 2016-10-26 LAB — URINALYSIS, ROUTINE W REFLEX MICROSCOPIC
BILIRUBIN URINE: NEGATIVE
Bacteria, UA: NONE SEEN
GLUCOSE, UA: NEGATIVE mg/dL
Ketones, ur: NEGATIVE mg/dL
LEUKOCYTES UA: NEGATIVE
NITRITE: NEGATIVE
PH: 5 (ref 5.0–8.0)
Protein, ur: NEGATIVE mg/dL
Specific Gravity, Urine: 1.016 (ref 1.005–1.030)

## 2016-10-26 LAB — WET PREP, GENITAL
Clue Cells Wet Prep HPF POC: NONE SEEN
Sperm: NONE SEEN
Trich, Wet Prep: NONE SEEN
WBC, Wet Prep HPF POC: NONE SEEN
Yeast Wet Prep HPF POC: NONE SEEN

## 2016-10-26 MED ORDER — CLOTRIMAZOLE 1 % VA CREA
1.0000 | TOPICAL_CREAM | Freq: Every day | VAGINAL | Status: DC
  Administered 2016-10-26: 1 via VAGINAL
  Filled 2016-10-26: qty 21

## 2016-10-26 NOTE — ED Triage Notes (Signed)
Pt via EMS from [place]. Cassidy [name].  A&Ox4, ambulatory w/steady gait using walker.  Hx UTIs.  Pt reports yeast infection, vaginal burning sensation but denies discharge for 3 wks. VS: 160/70, 70 bpm, 16 RR

## 2016-10-26 NOTE — ED Notes (Signed)
Bed: WA04 Expected date:  Expected time:  Means of arrival:  Comments: 83 yeast infection.

## 2016-10-26 NOTE — ED Provider Notes (Signed)
[facility]-EMERGENCY DEPT Provider Note: Lowella Dell, MD, FACEP  CSN: [id number] MRN: [id number] ARRIVAL: 10/26/16 at 0515 ROOM: WA04/WA04   CHIEF COMPLAINT  Vaginitis   HISTORY OF PRESENT ILLNESS  Cassidy [name] is a 83 y.o. female who complains of vague fall vaginal symptoms for the past 3 weeks. She states it feels "hot down there" but she denies pain or burning with urination. She does not know if it is changed in appearance because she has not "looked at myself for the mayor". She denies fever, chills, nausea, vomiting, diarrhea, vaginal bleeding or vaginal discharge. She has been using over-the-counter hygiene wipes without relief.   Past Medical History:  Diagnosis Date  . Arthritis   . HTN (hypertension)     Past Surgical History:  Procedure Laterality Date  . ABDOMINAL HYSTERECTOMY    . COLONOSCOPY WITH PROPOFOL N/A 06/06/2016   Procedure: COLONOSCOPY WITH PROPOFOL;  Surgeon: Bernette Redbird, MD;  Location[facility] ENDOSCOPY;  Service: Endoscopy;  Laterality: N/A;  . ESOPHAGOGASTRODUODENOSCOPY (EGD) WITH PROPOFOL N/A 06/06/2016   Procedure: ESOPHAGOGASTRODUODENOSCOPY (EGD) WITH PROPOFOL;  Surgeon: Bernette Redbird, MD;  Location: [facility] ENDOSCOPY;  Service: Endoscopy;  Laterality: N/A;  . TOTAL KNEE ARTHROPLASTY    . TUBAL LIGATION      Family History  Problem Relation Age of Onset  . Adopted: Yes    Social History  Substance Use Topics  . Smoking status: Former Smoker    Packs/day: 1.00    Years: 10.00    Types: Cigarettes    Quit date: 05/01/2016  . Smokeless tobacco: Never Used  . Alcohol use No    Prior to Admission medications   Medication Sig Start Date End Date Taking? Authorizing Provider  meclizine (ANTIVERT) 25 MG tablet Take 1 tablet (25 mg total) by mouth 3 (three) times daily as needed for dizziness. Patient not taking: Reported on 06/10/2016 05/12/16   Richarda Overlie, MD  Multiple Vitamin (MULTIVITAMIN WITH MINERALS) TABS tablet Take 1 tablet by mouth daily.     Historical Provider, MD  pantoprazole (PROTONIX) 40 MG tablet Take 1 tablet (40 mg total) by mouth 2 (two) times daily before a meal. Patient not taking: Reported on 06/10/2016 06/08/16   Maryann Mikhail, DO  sertraline (ZOLOFT) 50 MG tablet Take 50 mg by mouth at bedtime.  05/09/16   Historical Provider, MD  sucralfate (CARAFATE) 1 g tablet Take 1 tablet (1 g total) by mouth 4 (four) times daily. Patient not taking: Reported on 06/10/2016 06/08/16   Nita Sells Mikhail, DO  traZODone (DESYREL) 50 MG tablet Take 0.5 tablets (25 mg total) by mouth at bedtime as needed for sleep. Patient not taking: Reported on 06/10/2016 05/12/16   Richarda Overlie, MD    Allergies Ciprofloxacin hcl; Prednisone; and Zantac [ranitidine]   REVIEW OF SYSTEMS  Negative except as noted here or in the History of Present Illness.   PHYSICAL EXAMINATION  Initial Vital Signs Blood pressure 129/88, pulse (!) 55, temperature 98 F (36.7 C), temperature source Oral, resp. rate 18, height  (1.626 m), weight 135 lb (61.2 kg), SpO2 99 %.  Examination General: Well-developed, well-nourished female in no acute distress; appearance consistent with age of record HENT: normocephalic; atraumatic Eyes: pupils equal, round and reactive to light; extraocular muscles intact Neck: supple Heart: regular rate and rhythm Lungs: clear to auscultation bilaterally Abdomen: soft; nondistended; nontender; no masses or hepatosplenomegaly; bowel sounds present GU: Inflammation of vulvovaginal mucosa without vaginal discharge Extremities: No deformity; full range of motion; pulses normal  Neurologic: Awake, alert and oriented; motor function intact in all extremities and symmetric; no facial droop Skin: Warm and dry Psychiatric: Normal mood and affect   RESULTS  Summary of this visit's results, reviewed by myself:   EKG Interpretation  Date/Time:    Ventricular Rate:    PR Interval:    QRS Duration:   QT Interval:    QTC  Calculation:   R Axis:     Text Interpretation:        Laboratory Studies: Results for orders placed or performed during the hospital encounter of 10/26/16 (from the past 24 hour(s))  Wet prep, genital     Status: None   Collection Time: 10/26/16  5:19 AM  Result Value Ref Range   Yeast Wet Prep HPF POC NONE SEEN NONE SEEN   Trich, Wet Prep NONE SEEN NONE SEEN   Clue Cells Wet Prep HPF POC NONE SEEN NONE SEEN   WBC, Wet Prep HPF POC NONE SEEN NONE SEEN   Sperm NONE SEEN   Urinalysis, Routine w reflex microscopic     Status: Abnormal   Collection Time: 10/26/16  6:11 AM  Result Value Ref Range   Color, Urine YELLOW YELLOW   APPearance CLEAR CLEAR   Specific Gravity, Urine 1.016 1.005 - 1.030   pH 5.0 5.0 - 8.0   Glucose, UA NEGATIVE NEGATIVE mg/dL   Hgb urine dipstick SMALL (A) NEGATIVE   Bilirubin Urine NEGATIVE NEGATIVE   Ketones, ur NEGATIVE NEGATIVE mg/dL   Protein, ur NEGATIVE NEGATIVE mg/dL   Nitrite NEGATIVE NEGATIVE   Leukocytes, UA NEGATIVE NEGATIVE   RBC / HPF 0-5 0 - 5 RBC/hpf   WBC, UA 0-5 0 - 5 WBC/hpf   Bacteria, UA NONE SEEN NONE SEEN   Squamous Epithelial / LPF 0-5 (A) NONE SEEN   Mucous PRESENT    Imaging Studies: No results found.  ED COURSE  Nursing notes and initial vitals signs, including pulse oximetry, reviewed.  Vitals:   10/26/16 0526 10/26/16 0527  BP: 129/88   Pulse: (!) 55   Resp: 18   Temp: 98 F (36.7 C)   TempSrc: Oral   SpO2: 99%   Weight:  135 lb (61.2 kg)  Height:   (1.626 m)   We'll treat for vulvovaginal candidiasis.  PROCEDURES    ED DIAGNOSES     ICD-9-CM ICD-10-CM   1. Vulvovaginitis 616.10 N76.0        Paula Libra, MD 10/26/16 [phone]

## 2016-10-26 NOTE — ED Notes (Signed)
Attempted to call [facility] for report. No answer.

## 2016-12-21 ENCOUNTER — Emergency Department (HOSPITAL_COMMUNITY): Payer: Medicare HMO

## 2016-12-21 ENCOUNTER — Emergency Department (HOSPITAL_COMMUNITY)
Admission: EM | Admit: 2016-12-21 | Discharge: 2016-12-21 | Disposition: A | Discharge status: Skilled Nursing Facility | Payer: Medicare HMO | Attending: Physician Assistant | Admitting: Physician Assistant

## 2016-12-21 DIAGNOSIS — R42 Dizziness and giddiness: Secondary | ICD-10-CM | POA: Insufficient documentation

## 2016-12-21 DIAGNOSIS — Z87891 Personal history of nicotine dependence: Secondary | ICD-10-CM | POA: Diagnosis not present

## 2016-12-21 DIAGNOSIS — Z79899 Other long term (current) drug therapy: Secondary | ICD-10-CM | POA: Insufficient documentation

## 2016-12-21 DIAGNOSIS — R1030 Lower abdominal pain, unspecified: Secondary | ICD-10-CM | POA: Diagnosis not present

## 2016-12-21 DIAGNOSIS — Z96659 Presence of unspecified artificial knee joint: Secondary | ICD-10-CM | POA: Insufficient documentation

## 2016-12-21 DIAGNOSIS — I10 Essential (primary) hypertension: Secondary | ICD-10-CM | POA: Insufficient documentation

## 2016-12-21 LAB — COMPREHENSIVE METABOLIC PANEL
ALBUMIN: 4 g/dL (ref 3.5–5.0)
ALT: 11 U/L — ABNORMAL LOW (ref 14–54)
ANION GAP: 8 (ref 5–15)
AST: 24 U/L (ref 15–41)
Alkaline Phosphatase: 59 U/L (ref 38–126)
BILIRUBIN TOTAL: 0.5 mg/dL (ref 0.3–1.2)
BUN: 15 mg/dL (ref 6–20)
CO2: 23 mmol/L (ref 22–32)
Calcium: 9.4 mg/dL (ref 8.9–10.3)
Chloride: 107 mmol/L (ref 101–111)
Creatinine, Ser: 0.98 mg/dL (ref 0.44–1.00)
GFR calc non Af Amer: 53 mL/min — ABNORMAL LOW (ref >60)
GLUCOSE: 95 mg/dL (ref 65–99)
POTASSIUM: 4 mmol/L (ref 3.5–5.1)
Sodium: 138 mmol/L (ref 135–145)
TOTAL PROTEIN: 6.9 g/dL (ref 6.5–8.1)

## 2016-12-21 LAB — URINALYSIS, ROUTINE W REFLEX MICROSCOPIC
Bilirubin Urine: NEGATIVE
Glucose, UA: NEGATIVE mg/dL
Hgb urine dipstick: NEGATIVE
Ketones, ur: NEGATIVE mg/dL
Leukocytes, UA: NEGATIVE
NITRITE: NEGATIVE
PROTEIN: NEGATIVE mg/dL
SPECIFIC GRAVITY, URINE: 1.009 (ref 1.005–1.030)
pH: 6 (ref 5.0–8.0)

## 2016-12-21 LAB — CBC WITH DIFFERENTIAL/PLATELET
BASOS ABS: 0 10*3/uL (ref 0.0–0.1)
Basophils Relative: 0 %
Eosinophils Absolute: 0.1 10*3/uL (ref 0.0–0.7)
Eosinophils Relative: 1 %
HEMATOCRIT: 39.8 % (ref 36.0–46.0)
HEMOGLOBIN: 13.5 g/dL (ref 12.0–15.0)
LYMPHS PCT: 14 %
Lymphs Abs: 2 10*3/uL (ref 0.7–4.0)
MCH: 32.1 pg (ref 26.0–34.0)
MCHC: 33.9 g/dL (ref 30.0–36.0)
MCV: 94.8 fL (ref 78.0–100.0)
MONO ABS: 1.4 10*3/uL — AB (ref 0.1–1.0)
Monocytes Relative: 10 %
NEUTROS ABS: 10.8 10*3/uL — AB (ref 1.7–7.7)
NEUTROS PCT: 75 %
Platelets: 271 10*3/uL (ref 150–400)
RBC: 4.2 MIL/uL (ref 3.87–5.11)
RDW: 12.9 % (ref 11.5–15.5)
WBC: 14.4 10*3/uL — AB (ref 4.0–10.5)

## 2016-12-21 LAB — I-STAT TROPONIN, ED: Troponin i, poc: 0 ng/mL (ref 0.00–0.08)

## 2016-12-21 LAB — POC OCCULT BLOOD, ED: FECAL OCCULT BLD: NEGATIVE

## 2016-12-21 MED ORDER — IOPAMIDOL (ISOVUE-300) INJECTION 61%
INTRAVENOUS | Status: AC
Start: 2016-12-21 — End: 2016-12-21
  Administered 2016-12-21: 100 mL
  Filled 2016-12-21: qty 100

## 2016-12-21 NOTE — ED Notes (Signed)
[facility] called for transport by [employer] operational officersecretary.

## 2016-12-21 NOTE — ED Triage Notes (Signed)
[facility] where pt suddenly felt dizzy and lightheaded approx 9am while ambulating. Pt states did not pass out. C/o lower abdominal pain. Last BM yesterday. Denies fever. Alert oriented x4. VSS. +orthostatic hypotension for EMS. 20g LAC, cbg 123, 500ml NS PTA.

## 2016-12-21 NOTE — ED Notes (Signed)
Pt ambulated to and from restroom with walker; steady gait

## 2016-12-21 NOTE — ED Provider Notes (Signed)
[facility] Provider Note   CSN: [id number] Arrival date & time: 12/21/16  1052     History   Chief Complaint Chief Complaint  Patient presents with  . Dizziness    HPI Cassidy [name] is a 83 y.o. female.  HPI  Patient is a 83 year old female with multiple different complaints. Patient reports that she was walking after breakfast and went to watch ED with her friend. When she is watching TV she felt mildly lightheaded. She stood up and walked out of the room and then felt weak and came to the ground. Patient says "the people at work there are lying I didn't pass out".She also says that she had one episode of dark stool yesterday. Patient reports feeling fatigued for the last several days.   Patient also complained to the nurse of vaginal issues that she reports were not addressed during her last visit.    Past Medical History:  Diagnosis Date  . Arthritis   . HTN (hypertension)     Patient Active Problem List   Diagnosis Date Noted  . Orthostatic hypotension 06/11/2016  . Intractable abdominal pain 06/06/2016  . Diarrhea 06/06/2016  . Leukocytosis 06/06/2016  . Essential hypertension   . Chest pain, rule out acute myocardial infarction 05/10/2016  . Dizziness 05/10/2016  . Bradycardia 05/10/2016  . Subacute confusional state 05/10/2016  . Adjustment disorder with other symptoms 05/10/2016    Past Surgical History:  Procedure Laterality Date  . ABDOMINAL HYSTERECTOMY    . COLONOSCOPY WITH PROPOFOL N/A 06/06/2016   Procedure: COLONOSCOPY WITH PROPOFOL;  Surgeon: Bernette Redbird, MD;  Location[facility] ENDOSCOPY;  Service: Endoscopy;  Laterality: N/A;  . ESOPHAGOGASTRODUODENOSCOPY (EGD) WITH PROPOFOL N/A 06/06/2016   Procedure: ESOPHAGOGASTRODUODENOSCOPY (EGD) WITH PROPOFOL;  Surgeon: Bernette Redbird, MD;  Location: [facility] ENDOSCOPY;  Service: Endoscopy;  Laterality: N/A;  . TOTAL KNEE ARTHROPLASTY    . TUBAL LIGATION      OB History    No data available        Home Medications    Prior to Admission medications   Medication Sig Start Date End Date Taking? Authorizing Provider  donepezil (ARICEPT) 10 MG tablet Take 10 mg by mouth at bedtime.   Yes [provider]  meclizine (ANTIVERT) 25 MG tablet Take 25 mg by mouth 3 (three) times daily as needed for dizziness.   Yes [provider]  Melatonin 3 MG TABS Take 3 mg by mouth at bedtime.   Yes [provider]  Multiple Vitamins-Minerals (MULTIVITAMIN WITH MINERALS) tablet Take 1 tablet by mouth daily.   Yes [provider]  pantoprazole (PROTONIX) 40 MG tablet Take 40 mg by mouth daily.   Yes [provider]  Propylene Glycol (SYSTANE BALANCE) 0.6 % SOLN Apply 1 drop to eye 2 (two) times daily.   Yes [provider]  sertraline (ZOLOFT) 25 MG tablet Take 25 mg by mouth daily.   Yes [provider]  sucralfate (CARAFATE) 1 g tablet Take 1 g by mouth 4 (four) times daily.   Yes [provider]  Multiple Vitamin (MULTIVITAMIN WITH MINERALS) TABS tablet Take 1 tablet by mouth daily.    [provider]  sertraline (ZOLOFT) 50 MG tablet Take 50 mg by mouth at bedtime.  05/09/16   [provider]    Family History Family History  Problem Relation Age of Onset  . Adopted: Yes    Social History Social History  Substance Use Topics  . Smoking status: Former  Smoker    Packs/day: 1.00    Years: 10.00    Types: Cigarettes    Quit date: 05/01/2016  . Smokeless tobacco: Never Used  . Alcohol use No     Allergies   Ciprofloxacin hcl; Prednisone; and Zantac [ranitidine]   Review of Systems Review of Systems  Constitutional: Negative for chills and fever.  Eyes: Negative for pain.  Respiratory: Negative for cough.   Cardiovascular: Negative for chest pain.  Gastrointestinal: Negative for abdominal pain and vomiting.  Genitourinary: Negative for hematuria.  Musculoskeletal: Negative for back pain.   Neurological: Positive for dizziness. Negative for seizures.  All other systems reviewed and are negative.    Physical Exam Updated Vital Signs BP 140/85 (BP Location: Right Arm)   Pulse 65   Temp 98.6 F (37 C) (Oral)   Resp 14   SpO2 99%   Physical Exam  Constitutional: She is oriented to person, place, and time. She appears well-developed and well-nourished.  HENT:  Head: Normocephalic and atraumatic.  Eyes: Right eye exhibits no discharge.  Cardiovascular: Normal rate and regular rhythm.   No murmur heard. Pulmonary/Chest: Effort normal and breath sounds normal. No respiratory distress. She has no wheezes.  Abdominal: Soft. There is tenderness.  Genitourinary:  Genitourinary Comments: Normal vaginal appearance  Retcum with normal colored stool  Neurological: She is oriented to person, place, and time. No cranial nerve deficit.  Skin: Skin is warm and dry. She is not diaphoretic.  Psychiatric: She has a normal mood and affect.  Nursing note and vitals reviewed.    ED Treatments / Results  Labs (all labs ordered are listed, but only abnormal results are displayed) Labs Reviewed  COMPREHENSIVE METABOLIC PANEL - Abnormal; Notable for the following:       Result Value   ALT 11 (*)    GFR calc non Af Amer 53 (*)    All other components within normal limits  CBC WITH DIFFERENTIAL/PLATELET - Abnormal; Notable for the following:    WBC 14.4 (*)    Neutro Abs 10.8 (*)    Monocytes Absolute 1.4 (*)    All other components within normal limits  URINALYSIS, ROUTINE W REFLEX MICROSCOPIC - Abnormal; Notable for the following:    APPearance HAZY (*)    All other components within normal limits  I-STAT TROPOININ, ED  POC OCCULT BLOOD, ED    EKG  EKG Interpretation  Date/Time:  Friday December 21 2016 10:58:49 EDT Ventricular Rate:  62 PR Interval:    QRS Duration: 100 QT Interval:  454 QTC Calculation: 462 R Axis:   32 Text Interpretation:  Sinus rhythm Normal sinus  rhythm Confirmed by Latamara Melder ([id number]) on 12/21/2016 11:00:54 AM       Radiology No results found.  Procedures Procedures (including critical care time)  Medications Ordered in ED Medications  iopamidol (ISOVUE-300) 61 % injection (100 mLs  Contrast Given 12/21/16 1312)     Initial Impression / Assessment and Plan / ED Course  I have reviewed the triage vital signs and the nursing notes.  Pertinent labs & imaging results that were available during my care of the patient were reviewed by me and considered in my medical decision making (see chart for details).    Patient did very well appearing female presenting with multiple complaints. We'll do broad-spectrum workup. Patient said like she might of been presyncopal today. We will check for urine, pneumonia and x-ray, and basic labs. Patient had tenderness on abdominal exam so will  get a CT abdomen.  Patient complained of vaginal complaints which she has been treating with hydrogen peroxide. We recommended that she use more moisturizing product. However she disagrees, and does not want advice on treatemtn. . Would not give any perscription treatment right now given that she has normal appearing vaginal mucosa.  Ct negative.Patient's labs reassuring. She's continued to have normal vital signs here. EKG normal. Normal heart activity on telemetry. Patient ambulated and ate without issue.  We'll have her follow-up with primary care physician as an outpatient.  Final Clinical Impressions(s) / ED Diagnoses   Final diagnoses:  Dizziness    New Prescriptions Discharge Medication List as of 12/21/2016  5:29 PM       Abelino Derrick, MD 12/25/16 1549

## 2016-12-21 NOTE — ED Notes (Signed)
EDP at bedside  

## 2016-12-21 NOTE — ED Notes (Signed)
Pt ambulatory to restroom with walker; steady gait

## 2016-12-21 NOTE — ED Notes (Signed)
Pt ambulatory to restroom with walker accompanied by this nurse, pt with steady gait and no difficulty.

## 2016-12-21 NOTE — ED Notes (Signed)
Pt finished carton of milk and is now resting. Pt denies any N/V.

## 2017-01-08 ENCOUNTER — Encounter (HOSPITAL_COMMUNITY): Payer: Self-pay

## 2017-01-08 ENCOUNTER — Emergency Department (HOSPITAL_COMMUNITY): Payer: Medicare HMO

## 2017-01-08 ENCOUNTER — Emergency Department (HOSPITAL_COMMUNITY)
Admission: EM | Admit: 2017-01-08 | Discharge: 2017-01-08 | Disposition: A | Discharge status: Home / Self Care | Payer: Medicare HMO | Attending: Emergency Medicine | Admitting: Emergency Medicine

## 2017-01-08 DIAGNOSIS — I10 Essential (primary) hypertension: Secondary | ICD-10-CM | POA: Insufficient documentation

## 2017-01-08 DIAGNOSIS — R42 Dizziness and giddiness: Secondary | ICD-10-CM | POA: Diagnosis not present

## 2017-01-08 DIAGNOSIS — Z87891 Personal history of nicotine dependence: Secondary | ICD-10-CM | POA: Insufficient documentation

## 2017-01-08 DIAGNOSIS — M25531 Pain in right wrist: Secondary | ICD-10-CM | POA: Diagnosis not present

## 2017-01-08 HISTORY — DX: Other disorders of lung: J98.4

## 2017-01-08 HISTORY — DX: Urinary tract infection, site not specified: N39.0

## 2017-01-08 HISTORY — DX: Osteoarthritis of knee, unspecified: M17.9

## 2017-01-08 HISTORY — DX: Chest pain, unspecified: R07.9

## 2017-01-08 HISTORY — DX: Unilateral primary osteoarthritis, unspecified knee: M17.10

## 2017-01-08 HISTORY — DX: Bradycardia, unspecified: R00.1

## 2017-01-08 LAB — BASIC METABOLIC PANEL
ANION GAP: 14 (ref 5–15)
BUN: 12 mg/dL (ref 6–20)
CALCIUM: 9.5 mg/dL (ref 8.9–10.3)
CO2: 19 mmol/L — ABNORMAL LOW (ref 22–32)
CREATININE: 0.85 mg/dL (ref 0.44–1.00)
Chloride: 106 mmol/L (ref 101–111)
Glucose, Bld: 88 mg/dL (ref 65–99)
Potassium: 4 mmol/L (ref 3.5–5.1)
Sodium: 139 mmol/L (ref 135–145)

## 2017-01-08 LAB — URINALYSIS, ROUTINE W REFLEX MICROSCOPIC
BILIRUBIN URINE: NEGATIVE
Glucose, UA: NEGATIVE mg/dL
Hgb urine dipstick: NEGATIVE
Ketones, ur: NEGATIVE mg/dL
Leukocytes, UA: NEGATIVE
NITRITE: NEGATIVE
PROTEIN: NEGATIVE mg/dL
Specific Gravity, Urine: 1.01 (ref 1.005–1.030)
pH: 6 (ref 5.0–8.0)

## 2017-01-08 LAB — CBC
HCT: 36.9 % (ref 36.0–46.0)
HEMOGLOBIN: 12.5 g/dL (ref 12.0–15.0)
MCH: 31.6 pg (ref 26.0–34.0)
MCHC: 33.9 g/dL (ref 30.0–36.0)
MCV: 93.2 fL (ref 78.0–100.0)
PLATELETS: 299 10*3/uL (ref 150–400)
RBC: 3.96 MIL/uL (ref 3.87–5.11)
RDW: 12.4 % (ref 11.5–15.5)
WBC: 8.3 10*3/uL (ref 4.0–10.5)

## 2017-01-08 LAB — CBG MONITORING, ED: GLUCOSE-CAPILLARY: 94 mg/dL (ref 65–99)

## 2017-01-08 NOTE — ED Triage Notes (Signed)
[facility] Pt has complaint of unwitnessed fall. Denies LOC and was on the floor for a few minutes. Right wrist pain reported with no hitting of the head. Cleared spinal exam. No blood thinners. Alert and Oriented x4. Pt called for help and staff moved her to common area. Pt has had generalized weakness and dizziness x3 weeks. Reports hx of the same 2.5 weeks ago. Pt was told that she has orthostatic hypotension. Pt is on medication induced bradycardia. Vitals per EMS: 160/110, 60 HR, 132/80 with Standing. 20 RR, 92 % on RA. 106 CBG. 20 Gauge in R Hand.

## 2017-01-08 NOTE — ED Notes (Signed)
Pt continues to wait for PTAR 

## 2017-01-08 NOTE — ED Notes (Signed)
Spoke with care manager st's they will speak with pt

## 2017-01-08 NOTE — ED Notes (Signed)
Called Xray. To come get patient. Pt updated and verbalized understanding.

## 2017-01-08 NOTE — Discharge Planning (Signed)
EDCM consulted to assist with PCP establishment.  [facility] Anselm JunglingMary Hatchett, Adult-Geriatric Nurse Practitioner    Telephone: [phone]  HIPAA fax: [phone].  Corrie DandyMary will call/visit pt in pt home.

## 2017-01-08 NOTE — ED Notes (Signed)
Pt updated. Given food and drink. Pt remains on the monitor. Waiting for Xray

## 2017-01-08 NOTE — ED Notes (Signed)
Pt reports she is unable to urinate at this time.

## 2017-01-08 NOTE — ED Notes (Signed)
Pt taken to Xray.

## 2017-01-08 NOTE — ED Notes (Signed)
Pt returned from X-ray.  

## 2017-01-08 NOTE — ED Notes (Signed)
MD Campos at the bedside  

## 2017-01-08 NOTE — ED Provider Notes (Signed)
[facility] Provider Note   CSN: [id number] Arrival date & time: 01/08/17  0906     History   Chief Complaint Chief Complaint  Patient presents with  . Fall    HPI Cassidy [name] is a 83 y.o. female.  HPI Patient presents to the emergency department after developing some "dizziness" today and lightheadedness which resulted in an unwitnessed fall today at her assisted living center.  She reports no loss of consciousness but reports that she did feel lightheaded and weak and went to the ground.  She reports some pain in her right wrist.  She denies head injury or headache.  She has a history of similar symptoms and was seen in emergency department tonight weeks ago for similar symptoms.  RA for EMS was in the 60s.     Past Medical History:  Diagnosis Date  . Arthritis   . Bradycardia   . Chest pain   . Chronic pulmonary disease   . HTN (hypertension)   . Osteoarthritis of knee   . UTI (urinary tract infection)     Patient Active Problem List   Diagnosis Date Noted  . Orthostatic hypotension 06/11/2016  . Intractable abdominal pain 06/06/2016  . Diarrhea 06/06/2016  . Leukocytosis 06/06/2016  . Essential hypertension   . Chest pain, rule out acute myocardial infarction 05/10/2016  . Dizziness 05/10/2016  . Bradycardia 05/10/2016  . Subacute confusional state 05/10/2016  . Adjustment disorder with other symptoms 05/10/2016    Past Surgical History:  Procedure Laterality Date  . ABDOMINAL HYSTERECTOMY    . COLONOSCOPY WITH PROPOFOL N/A 06/06/2016   Procedure: COLONOSCOPY WITH PROPOFOL;  Surgeon: Bernette Redbird, MD;  Location: [facility] ENDOSCOPY;  Service: Endoscopy;  Laterality: N/A;  . ESOPHAGOGASTRODUODENOSCOPY (EGD) WITH PROPOFOL N/A 06/06/2016   Procedure: ESOPHAGOGASTRODUODENOSCOPY (EGD) WITH PROPOFOL;  Surgeon: Bernette Redbird, MD;  Location: [facility] ENDOSCOPY;  Service: Endoscopy;  Laterality: N/A;  . TOTAL KNEE ARTHROPLASTY    . TUBAL LIGATION      OB History     No data available       Home Medications    Prior to Admission medications   Medication Sig Start Date End Date Taking? Authorizing Provider  donepezil (ARICEPT) 10 MG tablet Take 10 mg by mouth at bedtime.    [provider]  meclizine (ANTIVERT) 25 MG tablet Take 25 mg by mouth 3 (three) times daily as needed for dizziness.    [provider]  Melatonin 3 MG TABS Take 3 mg by mouth at bedtime.    [provider]  Multiple Vitamin (MULTIVITAMIN WITH MINERALS) TABS tablet Take 1 tablet by mouth daily.    [provider]  Multiple Vitamins-Minerals (MULTIVITAMIN WITH MINERALS) tablet Take 1 tablet by mouth daily.    [provider]  pantoprazole (PROTONIX) 40 MG tablet Take 40 mg by mouth daily.    [provider]  Propylene Glycol (SYSTANE BALANCE) 0.6 % SOLN Apply 1 drop to eye 2 (two) times daily.    [provider]  sertraline (ZOLOFT) 25 MG tablet Take 25 mg by mouth daily.    [provider]  sertraline (ZOLOFT) 50 MG tablet Take 50 mg by mouth at bedtime.  05/09/16   [provider]  sucralfate (CARAFATE) 1 g tablet Take 1 g by mouth 4 (four) times daily.    [provider]    Family History Family History  Problem Relation Age of Onset  . Adopted: Yes    Social History  Social History  Substance Use Topics  . Smoking status: Former Smoker    Packs/day: 1.00    Years: 10.00    Types: Cigarettes    Quit date: 05/01/2016  . Smokeless tobacco: Never Used  . Alcohol use No     Allergies   Ciprofloxacin hcl; Prednisone; and Zantac [ranitidine]   Review of Systems Review of Systems  All other systems reviewed and are negative.    Physical Exam Updated Vital Signs BP (!) 116/50   Pulse 66   Temp 97.9 F (36.6 C) (Tympanic)   Resp 14   Ht 5\' 4"  (1.626 m)   Wt 63.5 kg (140 lb)   SpO2 95%   BMI 24.03 kg/m   Physical Exam  Constitutional: She is oriented to person,  place, and time. She appears well-developed and well-nourished. No distress.  HENT:  Head: Normocephalic and atraumatic.  Eyes: EOM are normal. Pupils are equal, round, and reactive to light.  Neck: Normal range of motion.  Cardiovascular: Normal rate, regular rhythm and normal heart sounds.   Pulmonary/Chest: Effort normal and breath sounds normal.  Abdominal: Soft. She exhibits no distension. There is no tenderness.  Musculoskeletal:  Mild pain with range of motion the right wrist without obvious deformity.  Otherwise full range of motion bilateral shoulders, elbows, left wrist.  Full range of motion bilateral hips, knees, ankles.  Neurological: She is alert and oriented to person, place, and time.  5/5 strength in major muscle groups of  bilateral upper and lower extremities. Speech normal. No facial asymetry.   Skin: Skin is warm and dry.  Psychiatric: She has a normal mood and affect. Judgment normal.  Nursing note and vitals reviewed.    ED Treatments / Results  Labs (all labs ordered are listed, but only abnormal results are displayed) Labs Reviewed  BASIC METABOLIC PANEL - Abnormal; Notable for the following:       Result Value   CO2 19 (*)    All other components within normal limits  CBC  URINALYSIS, ROUTINE W REFLEX MICROSCOPIC  CBG MONITORING, ED    EKG  EKG Interpretation  Date/Time:  Tuesday January 08 2017 09:13:40 EDT Ventricular Rate:  52 PR Interval:    QRS Duration: 87 QT Interval:  461 QTC Calculation: 429 R Axis:   51 Text Interpretation:  Sinus rhythm Nonspecific T abnrm, anterolateral leads Baseline wander in lead(s) V1 No significant change was found Confirmed by Azalia Bilis ([id number]) on 01/08/2017 3:45:01 PM       Radiology Dg Wrist Complete Right  Result Date: 01/08/2017 CLINICAL DATA:  Right wrist pain after fall today. EXAM: RIGHT WRIST - COMPLETE 3+ VIEW COMPARISON:  None. FINDINGS: There is no evidence of fracture or dislocation. There is no  evidence of arthropathy or other focal bone abnormality. Soft tissues are unremarkable. IMPRESSION: Normal right wrist. Electronically Signed   By: Lupita Raider, M.D.   On: 01/08/2017 14:20    Procedures Procedures (including critical care time)  Medications Ordered in ED Medications - No data to display   Initial Impression / Assessment and Plan / ED Course  I have reviewed the triage vital signs and the nursing notes.  Pertinent labs & imaging results that were available during my care of the patient were reviewed by me and considered in my medical decision making (see chart for details).     Overall well-appearing.  X-ray of the right wrist is normal.  Mild orthostasis noted with orthostatic vital signs.  Tolerating oral fluids.  Overall well-appearing.  Labs without significant abnormalities.  Nonfocal neuro exam.  I do not believe she needs acute imaging of her head.  Discharge home with primary care follow-up.  She understands return to the ER for new or worsening symptoms  Final Clinical Impressions(s) / ED Diagnoses   Final diagnoses:  Dizziness    New Prescriptions New Prescriptions   No medications on file     Azalia Bilisampos, Delberta Folts, MD 01/08/17 1545

## 2017-01-08 NOTE — ED Notes (Signed)
Case manager in to talk with pt.  St's they have set up a nurse to do in home care for her.  [facility] [place]

## 2017-12-12 IMAGING — CT CT ABD-PELV W/ CM
2 of 5 series · 16 of 46 positions shown, 18 images · IV contrast (APPLIED)
Comparison: None.

CLINICAL DATA: Constant diarrhea for 3 months

EXAM:
CT ABDOMEN AND PELVIS WITH CONTRAST
TECHNIQUE: Multidetector CT imaging of the abdomen and pelvis was performed
using the standard protocol following bolus administration of
intravenous contrast.
CONTRAST:  1 [id number]-Z99 IOPAMIDOL ([id number]-Z99) INJECTION 61%

[Series 2: abd/ pelvis 5.0 i30f 1 · axial · 0.71mm/px · z∈[+859,+1224]mm · 13 of 83 slices shown, 15 images]
[im 5/83  soft-tissue]
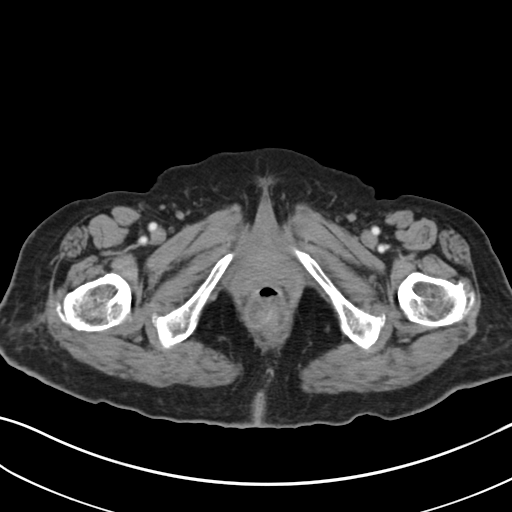
[im 5/83  bone]
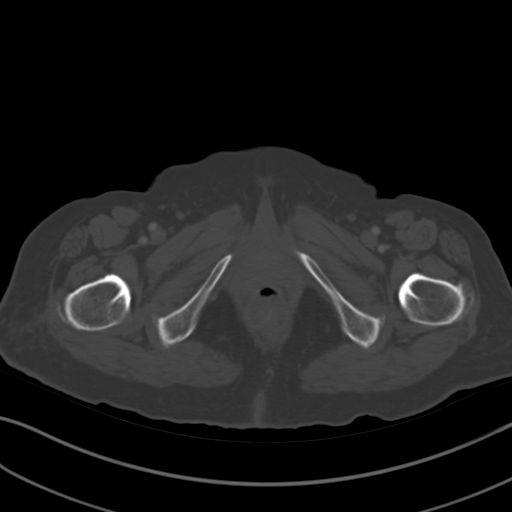
[im 13/83  soft-tissue]
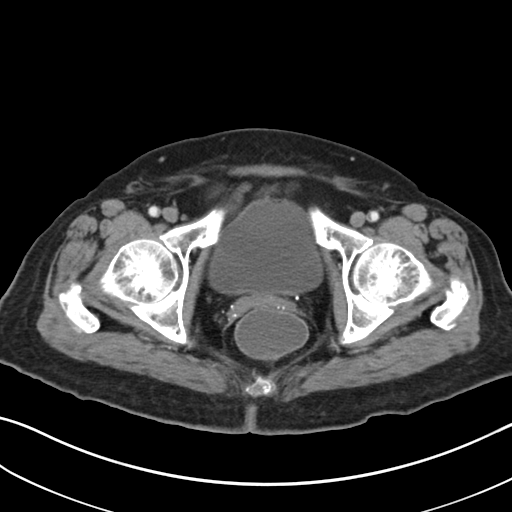
[im 17/83  soft-tissue]
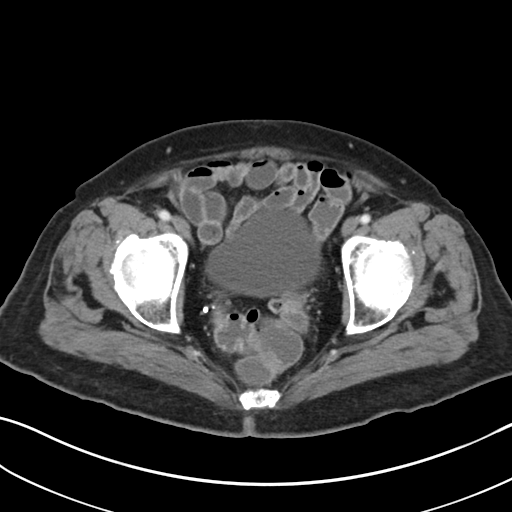
[im 25/83  soft-tissue]
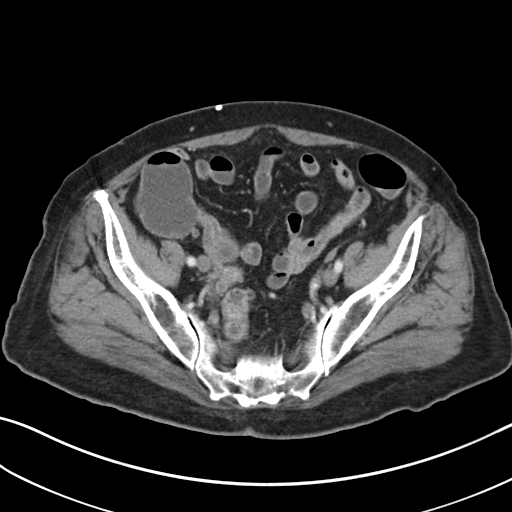
[im 29/83  soft-tissue]
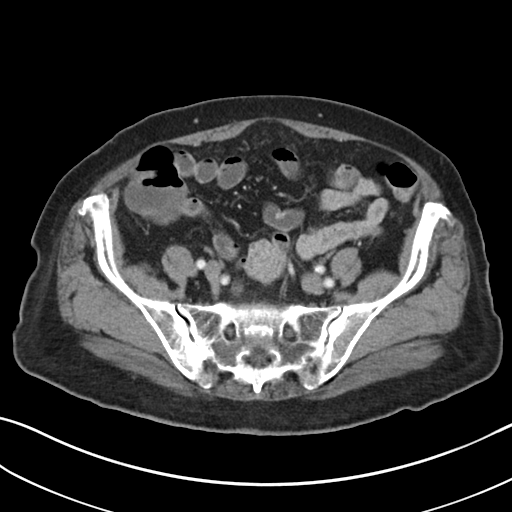
[im 37/83  soft-tissue]
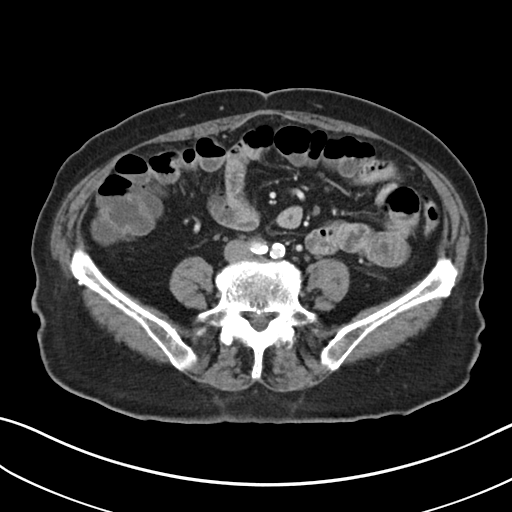
[im 42/83  soft-tissue]
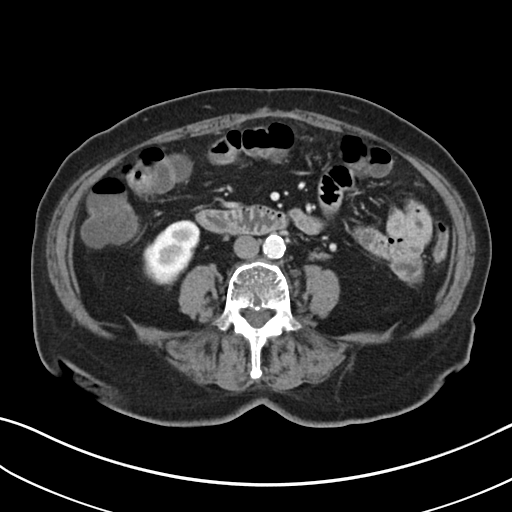
[im 46/83  soft-tissue]
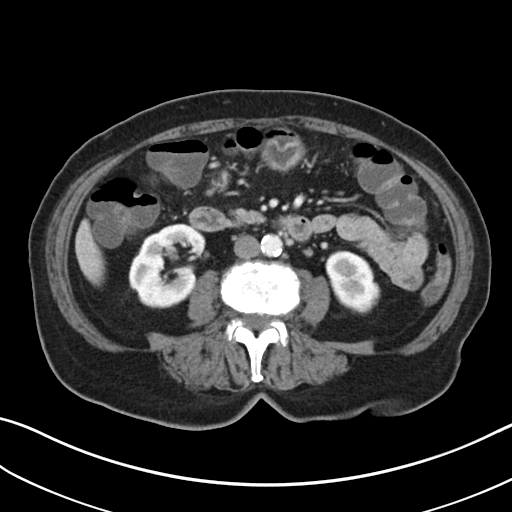
[im 54/83  soft-tissue]
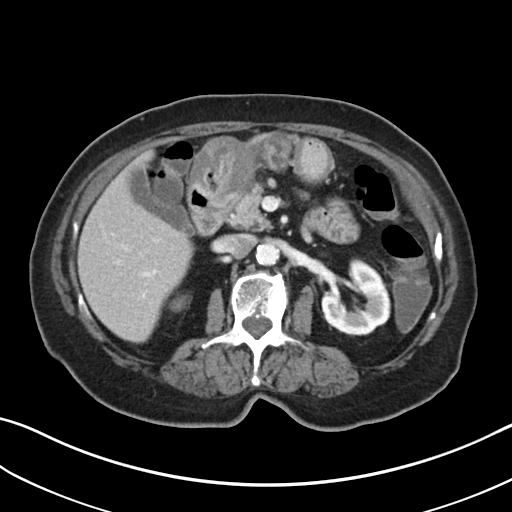
[im 54/83  bone]
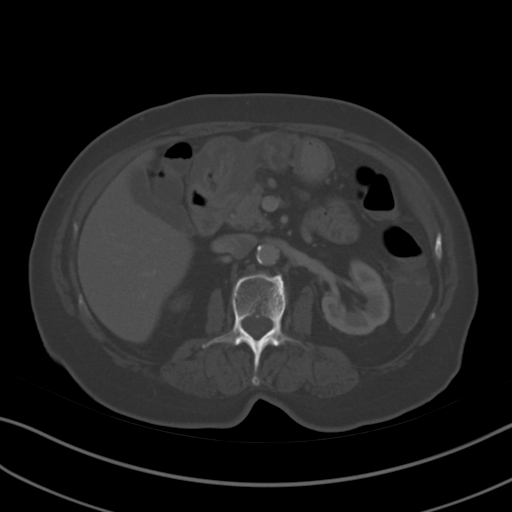
[im 58/83  soft-tissue]
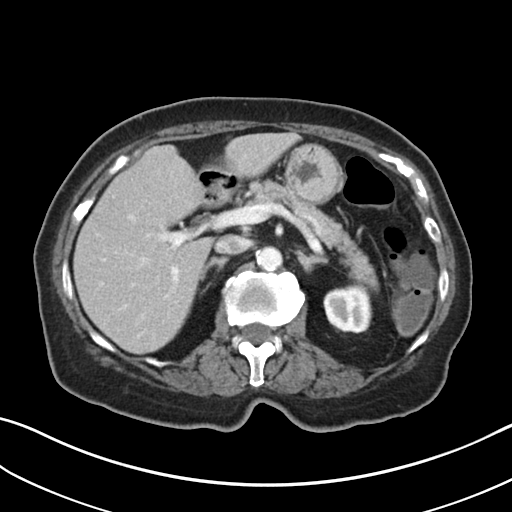
[im 66/83  soft-tissue]
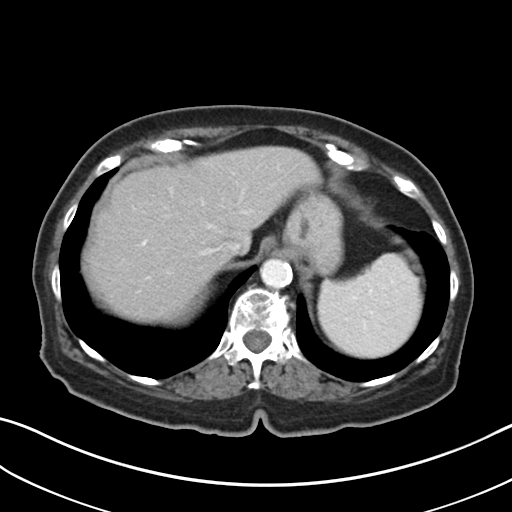
[im 70/83  soft-tissue]
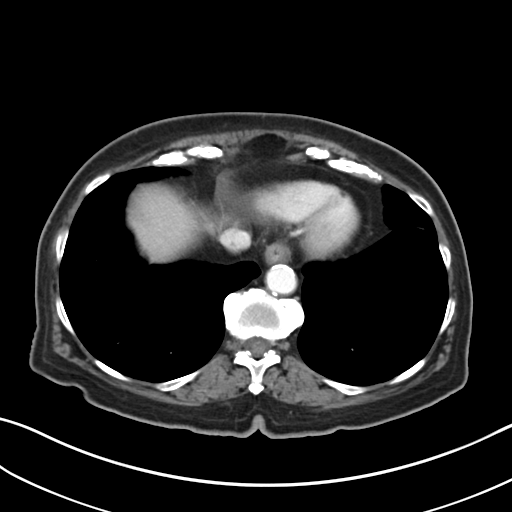
[im 78/83  soft-tissue]
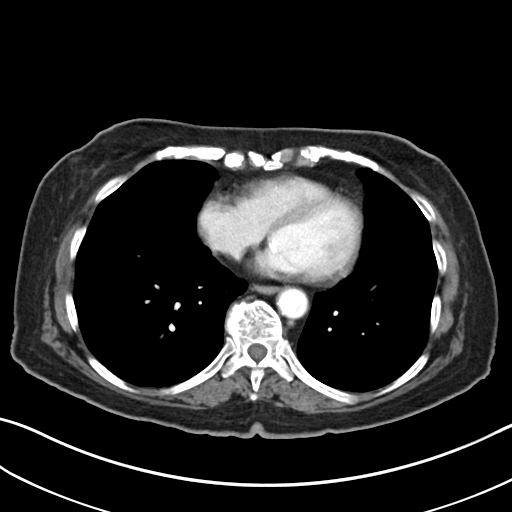

[Series 5: coronal soft tissue · coronal · 0.73mm/px · 3 of 95 slices shown]
[im 32/95  soft-tissue]
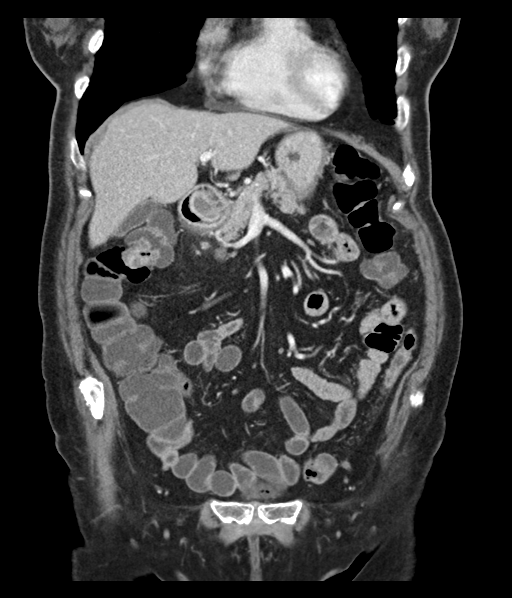
[im 42/95  soft-tissue]
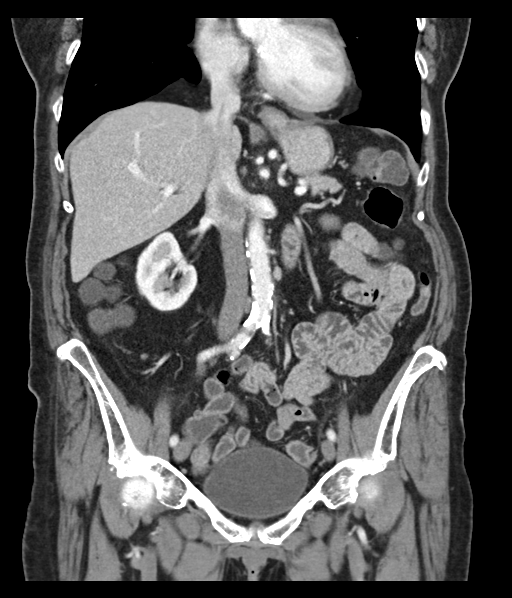
[im 53/95  soft-tissue]
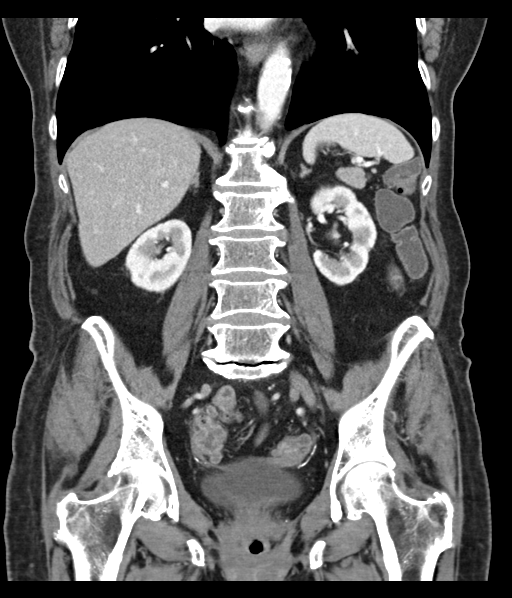

[16 of 46 positions shown; findings below may reference images not displayed]

FINDINGS: Lower chest: Lung bases are clear.

Hepatobiliary: No focal liver abnormality is seen. No gallstones,
gallbladder wall thickening, or biliary dilatation.

Pancreas: Unremarkable. No pancreatic ductal dilatation or
surrounding inflammatory changes.

Spleen: Normal in size without focal abnormality.

Adrenals/Urinary Tract: Adrenal glands are unremarkable. Kidneys are
normal, without renal calculi, focal lesion, or hydronephrosis.
Bladder is unremarkable.

Stomach/Bowel: Focal severe thickening of the antrum of the stomach.
Air-fluid levels throughout the colon consistent with diarrhea. No
pneumatosis, pneumoperitoneum or portal venous gas. Diverticulosis
of the sigmoid colon without evidence of diverticulitis.

Vascular/Lymphatic: Normal caliber abdominal aorta with
atherosclerosis. No lymphadenopathy.

Reproductive: Status post hysterectomy. No adnexal masses.

Other: No abdominal wall hernia or abnormality. No abdominopelvic
ascites.

Musculoskeletal: No lytic or sclerotic osseous lesion. Degenerative
disc disease with disc height loss at L5-S1 with bilateral facet
arthropathy. Bilateral foraminal narrowing at L5-S1.
IMPRESSION: 1. Focal severe thickening of the antrum of the stomach. The
appearance is concerning for malignancy versus severe gastritis.
Recommend further evaluation with upper endoscopy.
2. Air-fluid levels throughout the colon consistent with diarrhea.

## 2022-04-26 ENCOUNTER — Inpatient Hospital Stay (HOSPITAL_COMMUNITY)
Admission: EM | Admit: 2022-04-26 | Discharge: 2022-05-03 | DRG: 481 | Disposition: A | Discharge status: Skilled Nursing Facility | Payer: Medicare Other | Attending: Family Medicine | Admitting: Family Medicine

## 2022-04-26 ENCOUNTER — Other Ambulatory Visit: Payer: Self-pay

## 2022-04-26 ENCOUNTER — Inpatient Hospital Stay (HOSPITAL_COMMUNITY): Payer: Medicare Other

## 2022-04-26 ENCOUNTER — Emergency Department (HOSPITAL_COMMUNITY): Payer: Medicare Other

## 2022-04-26 ENCOUNTER — Encounter (HOSPITAL_COMMUNITY): Payer: Self-pay

## 2022-04-26 DIAGNOSIS — W1830XA Fall on same level, unspecified, initial encounter: Secondary | ICD-10-CM | POA: Diagnosis present

## 2022-04-26 DIAGNOSIS — Z79899 Other long term (current) drug therapy: Secondary | ICD-10-CM

## 2022-04-26 DIAGNOSIS — W19XXXA Unspecified fall, initial encounter: Secondary | ICD-10-CM

## 2022-04-26 DIAGNOSIS — F0390 Unspecified dementia without behavioral disturbance: Secondary | ICD-10-CM | POA: Diagnosis not present

## 2022-04-26 DIAGNOSIS — D62 Acute posthemorrhagic anemia: Secondary | ICD-10-CM | POA: Diagnosis not present

## 2022-04-26 DIAGNOSIS — D72829 Elevated white blood cell count, unspecified: Secondary | ICD-10-CM | POA: Diagnosis not present

## 2022-04-26 DIAGNOSIS — F03918 Unspecified dementia, unspecified severity, with other behavioral disturbance: Secondary | ICD-10-CM | POA: Diagnosis present

## 2022-04-26 DIAGNOSIS — Z96651 Presence of right artificial knee joint: Secondary | ICD-10-CM | POA: Diagnosis present

## 2022-04-26 DIAGNOSIS — Z888 Allergy status to other drugs, medicaments and biological substances status: Secondary | ICD-10-CM

## 2022-04-26 DIAGNOSIS — E559 Vitamin D deficiency, unspecified: Secondary | ICD-10-CM | POA: Diagnosis present

## 2022-04-26 DIAGNOSIS — I10 Essential (primary) hypertension: Secondary | ICD-10-CM | POA: Diagnosis present

## 2022-04-26 DIAGNOSIS — I4510 Unspecified right bundle-branch block: Secondary | ICD-10-CM | POA: Diagnosis not present

## 2022-04-26 DIAGNOSIS — Z90710 Acquired absence of both cervix and uterus: Secondary | ICD-10-CM

## 2022-04-26 DIAGNOSIS — N179 Acute kidney failure, unspecified: Secondary | ICD-10-CM | POA: Diagnosis not present

## 2022-04-26 DIAGNOSIS — S72141A Displaced intertrochanteric fracture of right femur, initial encounter for closed fracture: Principal | ICD-10-CM

## 2022-04-26 DIAGNOSIS — M1990 Unspecified osteoarthritis, unspecified site: Secondary | ICD-10-CM | POA: Diagnosis present

## 2022-04-26 DIAGNOSIS — Z87891 Personal history of nicotine dependence: Secondary | ICD-10-CM | POA: Diagnosis not present

## 2022-04-26 DIAGNOSIS — N289 Disorder of kidney and ureter, unspecified: Secondary | ICD-10-CM | POA: Diagnosis not present

## 2022-04-26 DIAGNOSIS — S72001A Fracture of unspecified part of neck of right femur, initial encounter for closed fracture: Secondary | ICD-10-CM | POA: Diagnosis present

## 2022-04-26 DIAGNOSIS — Z881 Allergy status to other antibiotic agents status: Secondary | ICD-10-CM | POA: Diagnosis not present

## 2022-04-26 DIAGNOSIS — Y92099 Unspecified place in other non-institutional residence as the place of occurrence of the external cause: Secondary | ICD-10-CM | POA: Diagnosis not present

## 2022-04-26 DIAGNOSIS — J449 Chronic obstructive pulmonary disease, unspecified: Secondary | ICD-10-CM | POA: Diagnosis present

## 2022-04-26 DIAGNOSIS — I452 Bifascicular block: Secondary | ICD-10-CM | POA: Diagnosis present

## 2022-04-26 LAB — CBC WITH DIFFERENTIAL/PLATELET
Abs Immature Granulocytes: 0.11 10*3/uL — ABNORMAL HIGH (ref 0.00–0.07)
Basophils Absolute: 0 10*3/uL (ref 0.0–0.1)
Basophils Relative: 0 %
Eosinophils Absolute: 0.1 10*3/uL (ref 0.0–0.5)
Eosinophils Relative: 0 %
HCT: 35.4 % — ABNORMAL LOW (ref 36.0–46.0)
Hemoglobin: 11.9 g/dL — ABNORMAL LOW (ref 12.0–15.0)
Immature Granulocytes: 1 %
Lymphocytes Relative: 19 %
Lymphs Abs: 2.6 10*3/uL (ref 0.7–4.0)
MCH: 32.6 pg (ref 26.0–34.0)
MCHC: 33.6 g/dL (ref 30.0–36.0)
MCV: 97 fL (ref 80.0–100.0)
Monocytes Absolute: 1.7 10*3/uL — ABNORMAL HIGH (ref 0.1–1.0)
Monocytes Relative: 13 %
Neutro Abs: 9.2 10*3/uL — ABNORMAL HIGH (ref 1.7–7.7)
Neutrophils Relative %: 67 %
Platelets: 378 10*3/uL (ref 150–400)
RBC: 3.65 MIL/uL — ABNORMAL LOW (ref 3.87–5.11)
RDW: 12.6 % (ref 11.5–15.5)
WBC: 13.7 10*3/uL — ABNORMAL HIGH (ref 4.0–10.5)
nRBC: 0 % (ref 0.0–0.2)

## 2022-04-26 LAB — CBG MONITORING, ED: Glucose-Capillary: 140 mg/dL — ABNORMAL HIGH (ref 70–99)

## 2022-04-26 LAB — COMPREHENSIVE METABOLIC PANEL
ALT: 21 U/L (ref 0–44)
AST: 31 U/L (ref 15–41)
Albumin: 3.3 g/dL — ABNORMAL LOW (ref 3.5–5.0)
Alkaline Phosphatase: 45 U/L (ref 38–126)
Anion gap: 13 (ref 5–15)
BUN: 25 mg/dL — ABNORMAL HIGH (ref 8–23)
CO2: 21 mmol/L — ABNORMAL LOW (ref 22–32)
Calcium: 9.5 mg/dL (ref 8.9–10.3)
Chloride: 102 mmol/L (ref 98–111)
Creatinine, Ser: 1.12 mg/dL — ABNORMAL HIGH (ref 0.44–1.00)
GFR, Estimated: 48 mL/min — ABNORMAL LOW (ref >60)
Glucose, Bld: 122 mg/dL — ABNORMAL HIGH (ref 70–99)
Potassium: 4.4 mmol/L (ref 3.5–5.1)
Sodium: 136 mmol/L (ref 135–145)
Total Bilirubin: 0.5 mg/dL (ref 0.3–1.2)
Total Protein: 6.6 g/dL (ref 6.5–8.1)

## 2022-04-26 LAB — ABO/RH: ABO/RH(D): A POS

## 2022-04-26 MED ORDER — PROSIGHT PO TABS
1.0000 | ORAL_TABLET | Freq: Every day | ORAL | Status: DC
  Administered 2022-04-26 – 2022-05-03 (×7): 1 via ORAL
  Filled 2022-04-26 (×9): qty 1

## 2022-04-26 MED ORDER — FENTANYL CITRATE PF 50 MCG/ML IJ SOSY
25.0000 ug | PREFILLED_SYRINGE | INTRAMUSCULAR | Status: DC | PRN
  Administered 2022-04-27 (×2): 25 ug via INTRAVENOUS
  Filled 2022-04-26 (×2): qty 1

## 2022-04-26 MED ORDER — ENOXAPARIN SODIUM 40 MG/0.4ML IJ SOSY
40.0000 mg | PREFILLED_SYRINGE | INTRAMUSCULAR | Status: DC
  Administered 2022-04-26: 40 mg via SUBCUTANEOUS
  Filled 2022-04-26: qty 0.4

## 2022-04-26 MED ORDER — PAROXETINE HCL 10 MG PO TABS
10.0000 mg | ORAL_TABLET | Freq: Every day | ORAL | Status: DC
  Administered 2022-04-26 – 2022-05-03 (×7): 10 mg via ORAL
  Filled 2022-04-26 (×9): qty 1

## 2022-04-26 MED ORDER — DIVALPROEX SODIUM 250 MG PO DR TAB
250.0000 mg | DELAYED_RELEASE_TABLET | Freq: Two times a day (BID) | ORAL | Status: DC
  Administered 2022-04-26 – 2022-05-03 (×12): 250 mg via ORAL
  Filled 2022-04-26 (×12): qty 1

## 2022-04-26 MED ORDER — FENTANYL CITRATE PF 50 MCG/ML IJ SOSY: 50.0000 ug | PREFILLED_SYRINGE | INTRAMUSCULAR | Status: DC | PRN

## 2022-04-26 MED ORDER — HYDROCODONE-ACETAMINOPHEN 5-325 MG PO TABS: 1.0000 | ORAL_TABLET | Freq: Four times a day (QID) | ORAL | Status: DC | PRN

## 2022-04-26 MED ORDER — LACTATED RINGERS IV SOLN: INTRAVENOUS | Status: AC

## 2022-04-26 MED ORDER — LATANOPROST 0.005 % OP SOLN
1.0000 [drp] | Freq: Every day | OPHTHALMIC | Status: DC
  Administered 2022-04-27 – 2022-05-01 (×5): 1 [drp] via OPHTHALMIC
  Filled 2022-04-26 (×2): qty 2.5

## 2022-04-26 MED ORDER — PRESERVISION AREDS 2 PO CAPS: 1.0000 | ORAL_CAPSULE | Freq: Two times a day (BID) | ORAL | Status: DC

## 2022-04-26 MED ORDER — SODIUM CHLORIDE 0.9 % IV SOLN: INTRAVENOUS | Status: DC

## 2022-04-26 MED ORDER — DONEPEZIL HCL 5 MG PO TABS
5.0000 mg | ORAL_TABLET | Freq: Every day | ORAL | Status: DC
  Administered 2022-04-26 – 2022-05-01 (×6): 5 mg via ORAL
  Filled 2022-04-26 (×6): qty 1

## 2022-04-26 MED ORDER — SENNOSIDES-DOCUSATE SODIUM 8.6-50 MG PO TABS
1.0000 | ORAL_TABLET | Freq: Every day | ORAL | Status: DC
  Administered 2022-04-26 – 2022-04-28 (×3): 1 via ORAL
  Filled 2022-04-26 (×3): qty 1

## 2022-04-26 MED ORDER — LOPERAMIDE HCL 2 MG PO CAPS: 2.0000 mg | ORAL_CAPSULE | ORAL | Status: DC | PRN

## 2022-04-26 MED ORDER — OCUVITE-LUTEIN PO CAPS
1.0000 | ORAL_CAPSULE | Freq: Every day | ORAL | Status: DC
  Filled 2022-04-26: qty 1

## 2022-04-26 MED ORDER — TRIPLE ANTIBIOTIC 3.5-400-5000 EX OINT: 1.0000 | TOPICAL_OINTMENT | CUTANEOUS | Status: DC | PRN

## 2022-04-26 MED ORDER — HYDROCODONE-ACETAMINOPHEN 5-325 MG PO TABS
1.0000 | ORAL_TABLET | Freq: Four times a day (QID) | ORAL | Status: DC | PRN
  Administered 2022-04-28 – 2022-05-02 (×5): 1 via ORAL
  Filled 2022-04-26 (×6): qty 1

## 2022-04-26 MED ORDER — CELECOXIB 200 MG PO CAPS
200.0000 mg | ORAL_CAPSULE | Freq: Every day | ORAL | Status: DC
  Administered 2022-04-28 – 2022-05-03 (×6): 200 mg via ORAL
  Filled 2022-04-26 (×7): qty 1

## 2022-04-26 MED ORDER — FENTANYL CITRATE (PF) 100 MCG/2ML IJ SOLN
INTRAMUSCULAR | Status: AC
  Filled 2022-04-26: qty 2

## 2022-04-26 NOTE — H&P (Signed)
History and Physical    Patient: Cassidy [name] [id number] DOB: Dec 25, 1933 DOA: 04/26/2022 DOS: the patient was seen and examined on 04/26/2022 PCP: Patient, No Pcp Per  Patient coming from: [place] care facility via EMS  Chief Complaint: Right hip pain  HPI: Cassidy [name] is a 88 y.o. female with medical history significant of hypertension, COPD, osteoarthritis, and dementia who presents with complaints of right hip pain.  Unable to obtain any significant history from the patient as she is confused.  She is unsure of where she is or why she is here.  Patient is under the legal guardianship of [facility] and are not able to be reached at this time by phone.  [facility] staff noted patient was found to be performing oral sex,  fell, and was unable to stand with complaints of right hip pain.  There was no reported loss of consciousness or trauma to her head.  Upon admission into the emergency department patient was noted to be afebrile with pulse 51-99, and all other vital signs relatively maintained at this time.  Labs significant for WBC 13.7, hemoglobin 11.9, BUN 25, and creatinine 1.12.  X-rays a right femoral intertrochanteric fracture with mild superior displacement of the distal fracture component, and right knee arthroplasty hardware intact.  CT imaging of the head showed no acute abnormality.   Review of Systems: unable to review all systems due to the inability of the patient to answer questions due to dementia. Past Medical History:  Diagnosis Date   Arthritis    Bradycardia    Chest pain    Chronic pulmonary disease    HTN (hypertension)    Osteoarthritis of knee    UTI (urinary tract infection)    Past Surgical History:  Procedure Laterality Date   ABDOMINAL HYSTERECTOMY     COLONOSCOPY WITH PROPOFOL N/A 06/06/2016   Procedure: COLONOSCOPY WITH PROPOFOL;  Surgeon: Ronald Lobo, MD;  Location: [facility] ENDOSCOPY;  Service: Endoscopy;  Laterality: N/A;    ESOPHAGOGASTRODUODENOSCOPY (EGD) WITH PROPOFOL N/A 06/06/2016   Procedure: ESOPHAGOGASTRODUODENOSCOPY (EGD) WITH PROPOFOL;  Surgeon: Ronald Lobo, MD;  Location: [facility] ENDOSCOPY;  Service: Endoscopy;  Laterality: N/A;   TOTAL KNEE ARTHROPLASTY     TUBAL LIGATION     Social History:  reports that she quit smoking about 5 years ago. Her smoking use included cigarettes. She has a 10.00 pack-year smoking history. She has never used smokeless tobacco. She reports that she does not drink alcohol and does not use drugs.  Allergies  Allergen Reactions   Ciprofloxacin Hcl     Present on the patient's MAR as an allergy, but she disputes it   Prednisone     Present on the patient's MAR as an allergy, but she disputes it   Zantac [Ranitidine] Other (See Comments)    "Too strong," per the patient    Family History  Adopted: Yes    Prior to Admission medications   Medication Sig Start Date End Date Taking? Authorizing Provider  donepezil (ARICEPT) 10 MG tablet Take 10 mg by mouth daily.     [provider]  meclizine (ANTIVERT) 25 MG tablet Take 25 mg by mouth 3 (three) times daily as needed for dizziness.    [provider]  Melatonin 3 MG TABS Take 6 mg by mouth at bedtime.     [provider]  Multiple Vitamins-Minerals (MULTIVITAMIN WITH MINERALS) tablet Take 1 tablet by mouth daily.    [provider]  pantoprazole (  PROTONIX) 40 MG tablet Take 40 mg by mouth 2 (two) times daily.     [provider]  Propylene Glycol (SYSTANE BALANCE) 0.6 % SOLN Apply 1 drop to eye 2 (two) times daily.    [provider]  sertraline (ZOLOFT) 25 MG tablet Take 25 mg by mouth daily.    [provider]  sucralfate (CARAFATE) 1 g tablet Take 1 g by mouth 4 (four) times daily.    [provider]  traZODone (DESYREL) 50 MG tablet Take 25 mg by mouth at bedtime as needed for sleep.    [provider]    Physical Exam: Vitals:    04/26/22 0934 04/26/22 1412 04/26/22 1457 04/26/22 1544  BP: 126/83 (!) 131/111  (!) 121/107  Pulse: (!) 51 99  91  Resp: 18 17  17   Temp: 98.1 F (36.7 C)  97.8 F (36.6 C)   TempSrc:   Oral   SpO2: (!) 79% 99%  93%   Constitutional: Elderly female currently in no acute distress Eyes: PERRL, lids and conjunctivae normal ENMT: Mucous membranes are moist.   Neck: normal, supple Respiratory: clear to auscultation bilaterally, no wheezing, no crackles.   Cardiovascular: Regular rate and rhythm.  No significant Abdomen: no tenderness, no masses palpated. No hepatosplenomegaly. Bowel sounds positive.  Musculoskeletal: no clubbing / cyanosis.  Right leg appears shortened, but currently in in immobilizer Skin: no rashes, lesions, ulcers.   Neurologic: CN 2-12 grossly intact.  Strength 5/5 in all 4.  Psychiatric: Alert and oriented to person only. \ Data Reviewed:  EKG revealed sinus rhythm at 95 bpm with premature atrial complexes, RBBB, LAFB and reviewed labs, imaging, and pertinent records as noted above in the HPI.  Assessment and Plan: Right intertrochanteric fracture secondary to fall Patient reportedly fell and developed right hip pain for which she was unable to get up.  X-rays revealed an acute right femoral intertrochanteric fracture with mild superior displacement of the distal fracture component.  [facility] is patient's legal guardian and were not able to be reached at this time. -Admit to a telemetry bed -Hip fracture order set utilized -N.p.o. after midnight -Orders placed for nerve block, but unable to be done due to lack of consent. -Check chest x-ray -Hydrocodone/fentanyl IV as needed for moderate to severe pain respectively -Orthopedics consulted, will follow-up for any further recommendations  Leukocytosis Acute.  WBC elevated at 13.7.  Suspect secondary to acute fracture, but question possibility of underlying infection. -Check urinalysis  Right bundle  branch block Acute.  EKG noted new right bundle branch and old left anterior fascicular block.  Last nuclear stress test was in 2017 that was noted to be of low risk.  EF at that time was 55-60% with grade 1 diastolic dysfunction. -Question need of further work-up with echocardiogram  Renal insufficiency Creatinine was noted to be 1.12 with BUN 25.  Patient baseline creatinine previously had been 0.8-0.9.  This is not greater than 94.3 increased from baseline to suggest acute kidney injury -Check CK given fall -Follow-up urinalysis -Gentle IV fluids at 75 mL/h  Dementia with behavioral disturbance Patient appears to have dementia at baseline. -Continue Aricept, Paxil, and Depakote   Advance Care Planning:   Code Status: Full Code   Consults: Ortho  Family Communication: Left message on number available to call the hospital when able  Severity of Illness: The appropriate patient status for this patient is INPATIENT. Inpatient status is judged to be reasonable and necessary in  order to provide the required intensity of service to ensure the patient's safety. The patient's presenting symptoms, physical exam findings, and initial radiographic and laboratory data in the context of their chronic comorbidities is felt to place them at high risk for further clinical deterioration. Furthermore, it is not anticipated that the patient will be medically stable for discharge from the hospital within 2 midnights of admission.   * I certify that at the point of admission it is my clinical judgment that the patient will require inpatient hospital care spanning beyond 2 midnights from the point of admission due to high intensity of service, high risk for further deterioration and high frequency of surveillance required.*  Author: Clydie Braun, MD 04/26/2022 2:04 PM  For on call review [email or web address].

## 2022-04-26 NOTE — ED Notes (Signed)
Informed phlebotomy that Type and screen was ordered, pt difficult stick, iv would not draw back for labs. Phlebotomist states she will obtain the T&S.

## 2022-04-26 NOTE — ED Provider Triage Note (Signed)
Emergency Medicine Provider Triage Evaluation Note  Cassidy [name] , a 88 y.o. female  was evaluated in triage.  Pt complains of right leg pain.  Patient reported to EMS that she fell, however is alert but not oriented at this time and does not remember what happened.  Patient is also shaking uncontrollably, which she says is not normal for her.  Review of Systems  Positive: Hip pain, memory loss Negative: Headache, chest pain, shortness of breath  Physical Exam  BP 126/83 (BP Location: Right Arm)   Pulse (!) 51   Temp 98.1 F (36.7 C)   Resp 18   SpO2 (!) 79%  Gen:   Awake, no distress   Resp:  Normal effort  MSK:   Moves extremities without difficulty  Other:    Medical Decision Making  Medically screening exam initiated at 9:58 AM.  Appropriate orders placed.  STEPHANA MORELL was informed that the remainder of the evaluation will be completed by another provider, this initial triage assessment does not replace that evaluation, and the importance of remaining in the ED until their evaluation is complete.  We will obtain x-rays of right lower extremity, begin altered mental status work-up including CT head due to loss of memory   Roylene Reason, PA-C 04/26/22 1001

## 2022-04-26 NOTE — ED Notes (Signed)
Phlebotomy at bedside attempting to draw type and screen.

## 2022-04-26 NOTE — H&P (View-Only) (Signed)
Reason for Consult:Right hip fx Referring Physician: Ankit Nanavati Time called: 1349 Time at bedside: 1410  Cassidy [name] is an 88 y.o. female.  HPI: Cassidy [name] fell and had right hip pain. She was brought to the ED where x-rays showed a right hip fx and orthopedic surgery was consulted. Circumstances of the fall are unclear as pt seems altered.  Past Medical History:  Diagnosis Date   Arthritis    Bradycardia    Chest pain    Chronic pulmonary disease    HTN (hypertension)    Osteoarthritis of knee    UTI (urinary tract infection)     Past Surgical History:  Procedure Laterality Date   ABDOMINAL HYSTERECTOMY     COLONOSCOPY WITH PROPOFOL N/A 06/06/2016   Procedure: COLONOSCOPY WITH PROPOFOL;  Surgeon: Robert Buccini, MD;  Location: [facility] ENDOSCOPY;  Service: Endoscopy;  Laterality: N/A;   ESOPHAGOGASTRODUODENOSCOPY (EGD) WITH PROPOFOL N/A 06/06/2016   Procedure: ESOPHAGOGASTRODUODENOSCOPY (EGD) WITH PROPOFOL;  Surgeon: Robert Buccini, MD;  Location: [facility] ENDOSCOPY;  Service: Endoscopy;  Laterality: N/A;   TOTAL KNEE ARTHROPLASTY     TUBAL LIGATION      Family History  Adopted: Yes    Social History:  reports that she quit smoking about 5 years ago. Her smoking use included cigarettes. She has a 10.00 pack-year smoking history. She has never used smokeless tobacco. She reports that she does not drink alcohol and does not use drugs.  Allergies:  Allergies  Allergen Reactions   Ciprofloxacin Hcl     Present on the patient's MAR as an allergy, but she disputes it   Prednisone     Present on the patient's MAR as an allergy, but she disputes it   Zantac [Ranitidine] Other (See Comments)    "Too strong," per the patient    Medications: I have reviewed the patient's current medications.  Results for orders placed or performed during the hospital encounter of 04/26/22 (from the past 48 hour(s))  CBC WITH DIFFERENTIAL     Status: Abnormal   Collection Time: 04/26/22 10:09 AM   Result Value Ref Range   WBC 13.7 (H) 4.0 - 10.5 K/uL   RBC 3.65 (L) 3.87 - 5.11 MIL/uL   Hemoglobin 11.9 (L) 12.0 - 15.0 g/dL   HCT 35.4 (L) 36.0 - 46.0 %   MCV 97.0 80.0 - 100.0 fL   MCH 32.6 26.0 - 34.0 pg   MCHC 33.6 30.0 - 36.0 g/dL    Comment: CORRECTED FOR COLD AGGLUTININS   RDW 12.6 11.5 - 15.5 %   Platelets 378 150 - 400 K/uL   nRBC 0.0 0.0 - 0.2 %   Neutrophils Relative % 67 %   Neutro Abs 9.2 (H) 1.7 - 7.7 K/uL   Lymphocytes Relative 19 %   Lymphs Abs 2.6 0.7 - 4.0 K/uL   Monocytes Relative 13 %   Monocytes Absolute 1.7 (H) 0.1 - 1.0 K/uL   Eosinophils Relative 0 %   Eosinophils Absolute 0.1 0.0 - 0.5 K/uL   Basophils Relative 0 %   Basophils Absolute 0.0 0.0 - 0.1 K/uL   Immature Granulocytes 1 %   Abs Immature Granulocytes 0.11 (H) 0.00 - 0.07 K/uL    Comment: Performed at [facility], [address] [place], [place] [address]  Comprehensive metabolic panel     Status: Abnormal   Collection Time: 04/26/22 10:09 AM  Result Value Ref Range   Sodium 136 135 - 145 mmol/L   Potassium 4.4 3.5 -   5.1 mmol/L   Chloride 102 98 - 111 mmol/L   CO2 21 (L) 22 - 32 mmol/L   Glucose, Bld 122 (H) 70 - 99 mg/dL    Comment: Glucose reference range applies only to samples taken after fasting for at least 8 hours.   BUN 25 (H) 8 - 23 mg/dL   Creatinine, Ser 1.12 (H) 0.44 - 1.00 mg/dL   Calcium 9.5 8.9 - 10.3 mg/dL   Total Protein 6.6 6.5 - 8.1 g/dL   Albumin 3.3 (L) 3.5 - 5.0 g/dL   AST 31 15 - 41 U/L   ALT 21 0 - 44 U/L   Alkaline Phosphatase 45 38 - 126 U/L   Total Bilirubin 0.5 0.3 - 1.2 mg/dL   GFR, Estimated 48 (L) >60 mL/min    Comment: (NOTE) Calculated using the CKD-EPI Creatinine Equation (2021)    Anion gap 13 5 - 15    Comment: Performed at [facility], [address] [place], [place] [address]    CT HEAD WO CONTRAST  Result Date: 04/26/2022 CLINICAL DATA:  Memory loss, fall. EXAM: CT HEAD WITHOUT CONTRAST TECHNIQUE: Contiguous axial images  were obtained from the base of the skull through the vertex without intravenous contrast. RADIATION DOSE REDUCTION: This exam was performed according to the departmental dose-optimization program which includes automated exposure control, adjustment of the mA and/or kV according to patient size and/or use of iterative reconstruction technique. COMPARISON:  March 28, 2018. FINDINGS: Brain: No evidence of acute infarction, hemorrhage, hydrocephalus, extra-axial collection or mass lesion/mass effect. Vascular: No hyperdense vessel or unexpected calcification. Skull: Normal. Negative for fracture or focal lesion. Sinuses/Orbits: No acute finding. Other: None. IMPRESSION: No acute intracranial abnormality seen. Electronically Signed   By: James  Green Jr M.D.   On: 04/26/2022 11:26   DG Knee Complete 4 Views Right  Result Date: 04/26/2022 CLINICAL DATA:  Fall.  Pain. EXAM: PELVIS - 1-2 VIEW; RIGHT KNEE - COMPLETE 4+ VIEW; RIGHT FEMUR 2 VIEWS COMPARISON:  CT abdomen and pelvis 12/21/2016 FINDINGS: Pelvis: There is diffuse decreased bone mineralization. Mild-to-moderate bilateral femoroacetabular joint space narrowing and superolateral acetabular degenerative osteophytosis. Acute, oblique right intertrochanteric fracture with mild superior displacement of the distal fracture component. There is medial dislocation of the lesser trochanter. Right femur: Redemonstration of right femoral intertrochanteric fracture with approximately 2 cm superior displacement of the distal fracture component. There is medial displacement of the lesser trochanter. No additional acute fracture is seen within the more distal aspect of the right femur. Right knee: Postsurgical changes of total right knee arthroplasty. No perihardware lucency is seen to indicate hardware failure or loosening. Possible small knee joint effusion. Mild chronic enthesopathic change at the quadriceps insertion on the patella. IMPRESSION: 1. Acute right femoral  intertrochanteric fracture with mild superior displacement of the distal fracture component. 2. Status post total right knee arthroplasty without evidence of hardware failure. Electronically Signed   By: Ronald  Viola M.D.   On: 04/26/2022 10:57   DG FEMUR, MIN 2 VIEWS RIGHT  Result Date: 04/26/2022 CLINICAL DATA:  Fall.  Pain. EXAM: PELVIS - 1-2 VIEW; RIGHT KNEE - COMPLETE 4+ VIEW; RIGHT FEMUR 2 VIEWS COMPARISON:  CT abdomen and pelvis 12/21/2016 FINDINGS: Pelvis: There is diffuse decreased bone mineralization. Mild-to-moderate bilateral femoroacetabular joint space narrowing and superolateral acetabular degenerative osteophytosis. Acute, oblique right intertrochanteric fracture with mild superior displacement of the distal fracture component. There is medial dislocation of the lesser trochanter. Right femur: Redemonstration of right femoral intertrochanteric   fracture with approximately 2 cm superior displacement of the distal fracture component. There is medial displacement of the lesser trochanter. No additional acute fracture is seen within the more distal aspect of the right femur. Right knee: Postsurgical changes of total right knee arthroplasty. No perihardware lucency is seen to indicate hardware failure or loosening. Possible small knee joint effusion. Mild chronic enthesopathic change at the quadriceps insertion on the patella. IMPRESSION: 1. Acute right femoral intertrochanteric fracture with mild superior displacement of the distal fracture component. 2. Status post total right knee arthroplasty without evidence of hardware failure. Electronically Signed   By: Yvonne Kendall M.D.   On: 04/26/2022 10:57   DG Pelvis 1-2 Views  Result Date: 04/26/2022 CLINICAL DATA:  Fall.  Pain. EXAM: PELVIS - 1-2 VIEW; RIGHT KNEE - COMPLETE 4+ VIEW; RIGHT FEMUR 2 VIEWS COMPARISON:  CT abdomen and pelvis 12/21/2016 FINDINGS: Pelvis: There is diffuse decreased bone mineralization. Mild-to-moderate bilateral  femoroacetabular joint space narrowing and superolateral acetabular degenerative osteophytosis. Acute, oblique right intertrochanteric fracture with mild superior displacement of the distal fracture component. There is medial dislocation of the lesser trochanter. Right femur: Redemonstration of right femoral intertrochanteric fracture with approximately 2 cm superior displacement of the distal fracture component. There is medial displacement of the lesser trochanter. No additional acute fracture is seen within the more distal aspect of the right femur. Right knee: Postsurgical changes of total right knee arthroplasty. No perihardware lucency is seen to indicate hardware failure or loosening. Possible small knee joint effusion. Mild chronic enthesopathic change at the quadriceps insertion on the patella. IMPRESSION: 1. Acute right femoral intertrochanteric fracture with mild superior displacement of the distal fracture component. 2. Status post total right knee arthroplasty without evidence of hardware failure. Electronically Signed   By: Yvonne Kendall M.D.   On: 04/26/2022 10:57    Review of Systems  Unable to perform ROS: Mental status change  Musculoskeletal:  Positive for arthralgias (Right hip).   Blood pressure (!) 131/111, pulse 99, temperature 98.1 F (36.7 C), resp. rate 17, SpO2 99 %. Physical Exam Constitutional:      General: She is not in acute distress.    Appearance: She is well-developed. She is not diaphoretic.  HENT:     Head: Normocephalic and atraumatic.  Eyes:     General: No scleral icterus.       Right eye: No discharge.        Left eye: No discharge.     Conjunctiva/sclera: Conjunctivae normal.  Cardiovascular:     Rate and Rhythm: Normal rate and regular rhythm.  Pulmonary:     Effort: Pulmonary effort is normal. No respiratory distress.  Musculoskeletal:     Cervical back: Normal range of motion.     Comments: RLE No traumatic wounds, ecchymosis, or rash  Mod TTP  hip  No knee or ankle effusion  Knee stable to varus/ valgus and anterior/posterior stress  Sens DPN, SPN, TN intact  Motor EHL, ext, flex, evers 5/5  DP 1+, PT 1+, No significant edema  Skin:    General: Skin is warm and dry.  Neurological:     Mental Status: She is alert.  Psychiatric:        Mood and Affect: Mood normal.        Behavior: Behavior normal.     Assessment/Plan: Right hip fx -- Plan IMN tomorrow with Dr. Doreatha Martin. Please keep NPO after MN.    Lisette Abu, PA-C Orthopedic Surgery [phone] 04/26/2022, 2:20 PM

## 2022-04-26 NOTE — ED Notes (Signed)
Pt returns from PACU

## 2022-04-26 NOTE — ED Provider Notes (Addendum)
[facility] EMERGENCY DEPARTMENT Provider Note   CSN: [id number] Arrival date & time: 04/26/22  8413     History  No chief complaint on file.   Cassidy [name] is a 88 y.o. female.  HPI     88 year old female comes in chief complaint of hip injury. According to the nursing triage note, patient was having oral intercourse, had a mechanical fall and is in pain.  During my encounter, patient indicates that she is unsure why she fell and unable to provide any details.  She denies any pain. Patient's records reviewed.  She is not on any blood thinners.  Past medical history of hypertension, arthritis, bradycardia.  Home Medications Prior to Admission medications   Medication Sig Start Date End Date Taking? Authorizing Provider  donepezil (ARICEPT) 10 MG tablet Take 10 mg by mouth daily.     [provider]  meclizine (ANTIVERT) 25 MG tablet Take 25 mg by mouth 3 (three) times daily as needed for dizziness.    [provider]  Melatonin 3 MG TABS Take 6 mg by mouth at bedtime.     [provider]  Multiple Vitamins-Minerals (MULTIVITAMIN WITH MINERALS) tablet Take 1 tablet by mouth daily.    [provider]  pantoprazole (PROTONIX) 40 MG tablet Take 40 mg by mouth 2 (two) times daily.     [provider]  Propylene Glycol (SYSTANE BALANCE) 0.6 % SOLN Apply 1 drop to eye 2 (two) times daily.    [provider]  sertraline (ZOLOFT) 25 MG tablet Take 25 mg by mouth daily.    [provider]  sucralfate (CARAFATE) 1 g tablet Take 1 g by mouth 4 (four) times daily.    [provider]  traZODone (DESYREL) 50 MG tablet Take 25 mg by mouth at bedtime as needed for sleep.    [provider]      Allergies    Ciprofloxacin hcl, Prednisone, and Zantac [ranitidine]    Review of Systems   Review of Systems  All other systems reviewed and are negative.   Physical Exam Updated Vital Signs BP  (!) 131/111   Pulse 99   Temp 98.1 F (36.7 C)   Resp 17   SpO2 99%  Physical Exam Vitals and nursing note reviewed.  Constitutional:      Appearance: She is well-developed.  HENT:     Head: Atraumatic.  Neck:     Comments: No midline c-spine tenderness, pt able to turn head to 45 degrees bilaterally without any pain and able to flex neck to the chest and extend without any pain or neurologic symptoms.  Cardiovascular:     Rate and Rhythm: Normal rate.  Pulmonary:     Effort: Pulmonary effort is normal.  Musculoskeletal:     Cervical back: Normal range of motion and neck supple.     Comments: Shortened and externally rotated right lower extremity, tenderness over the pelvic area  Skin:    General: Skin is warm and dry.  Neurological:     Mental Status: She is alert and oriented to person, place, and time.     ED Results / Procedures / Treatments   Labs (all labs ordered are listed, but only abnormal results are displayed) Labs Reviewed  CBC WITH DIFFERENTIAL/PLATELET - Abnormal; Notable for the following components:      Result Value   WBC 13.7 (*)    RBC 3.65 (*)    Hemoglobin 11.9 (*)  HCT 35.4 (*)    Neutro Abs 9.2 (*)    Monocytes Absolute 1.7 (*)    Abs Immature Granulocytes 0.11 (*)    All other components within normal limits  COMPREHENSIVE METABOLIC PANEL - Abnormal; Notable for the following components:   CO2 21 (*)    Glucose, Bld 122 (*)    BUN 25 (*)    Creatinine, Ser 1.12 (*)    Albumin 3.3 (*)    GFR, Estimated 48 (*)    All other components within normal limits  CBG MONITORING, ED - Abnormal; Notable for the following components:   Glucose-Capillary 140 (*)    All other components within normal limits  URINALYSIS, ROUTINE W REFLEX MICROSCOPIC    EKG EKG Interpretation  Date/Time:  Thursday April 26 2022 11:24:01 EDT Ventricular Rate:  95 PR Interval:  164 QRS Duration: 118 QT Interval:  396 QTC Calculation: 497 R Axis:   -77 Text  Interpretation: Sinus rhythm with Premature supraventricular complexes Right bundle branch block - new Left anterior fascicular block Anterior infarct , age undetermined Abnormal ECG new bundle branch block Confirmed by Derwood Kaplan [phone]) on 04/26/2022 2:25:32 PM  Radiology CT HEAD WO CONTRAST  Result Date: 04/26/2022 CLINICAL DATA:  Memory loss, fall. EXAM: CT HEAD WITHOUT CONTRAST TECHNIQUE: Contiguous axial images were obtained from the base of the skull through the vertex without intravenous contrast. RADIATION DOSE REDUCTION: This exam was performed according to the departmental dose-optimization program which includes automated exposure control, adjustment of the mA and/or kV according to patient size and/or use of iterative reconstruction technique. COMPARISON:  March 28, 2018. FINDINGS: Brain: No evidence of acute infarction, hemorrhage, hydrocephalus, extra-axial collection or mass lesion/mass effect. Vascular: No hyperdense vessel or unexpected calcification. Skull: Normal. Negative for fracture or focal lesion. Sinuses/Orbits: No acute finding. Other: None. IMPRESSION: No acute intracranial abnormality seen. Electronically Signed   By: Lupita Raider M.D.   On: 04/26/2022 11:26   DG Knee Complete 4 Views Right  Result Date: 04/26/2022 CLINICAL DATA:  Fall.  Pain. EXAM: PELVIS - 1-2 VIEW; RIGHT KNEE - COMPLETE 4+ VIEW; RIGHT FEMUR 2 VIEWS COMPARISON:  CT abdomen and pelvis 12/21/2016 FINDINGS: Pelvis: There is diffuse decreased bone mineralization. Mild-to-moderate bilateral femoroacetabular joint space narrowing and superolateral acetabular degenerative osteophytosis. Acute, oblique right intertrochanteric fracture with mild superior displacement of the distal fracture component. There is medial dislocation of the lesser trochanter. Right femur: Redemonstration of right femoral intertrochanteric fracture with approximately 2 cm superior displacement of the distal fracture component. There  is medial displacement of the lesser trochanter. No additional acute fracture is seen within the more distal aspect of the right femur. Right knee: Postsurgical changes of total right knee arthroplasty. No perihardware lucency is seen to indicate hardware failure or loosening. Possible small knee joint effusion. Mild chronic enthesopathic change at the quadriceps insertion on the patella. IMPRESSION: 1. Acute right femoral intertrochanteric fracture with mild superior displacement of the distal fracture component. 2. Status post total right knee arthroplasty without evidence of hardware failure. Electronically Signed   By: Neita Garnet M.D.   On: 04/26/2022 10:57   DG FEMUR, MIN 2 VIEWS RIGHT  Result Date: 04/26/2022 CLINICAL DATA:  Fall.  Pain. EXAM: PELVIS - 1-2 VIEW; RIGHT KNEE - COMPLETE 4+ VIEW; RIGHT FEMUR 2 VIEWS COMPARISON:  CT abdomen and pelvis 12/21/2016 FINDINGS: Pelvis: There is diffuse decreased bone mineralization. Mild-to-moderate bilateral femoroacetabular joint space narrowing and superolateral acetabular degenerative osteophytosis. Acute, oblique right intertrochanteric  fracture with mild superior displacement of the distal fracture component. There is medial dislocation of the lesser trochanter. Right femur: Redemonstration of right femoral intertrochanteric fracture with approximately 2 cm superior displacement of the distal fracture component. There is medial displacement of the lesser trochanter. No additional acute fracture is seen within the more distal aspect of the right femur. Right knee: Postsurgical changes of total right knee arthroplasty. No perihardware lucency is seen to indicate hardware failure or loosening. Possible small knee joint effusion. Mild chronic enthesopathic change at the quadriceps insertion on the patella. IMPRESSION: 1. Acute right femoral intertrochanteric fracture with mild superior displacement of the distal fracture component. 2. Status post total right  knee arthroplasty without evidence of hardware failure. Electronically Signed   By: Yvonne Kendall M.D.   On: 04/26/2022 10:57   DG Pelvis 1-2 Views  Result Date: 04/26/2022 CLINICAL DATA:  Fall.  Pain. EXAM: PELVIS - 1-2 VIEW; RIGHT KNEE - COMPLETE 4+ VIEW; RIGHT FEMUR 2 VIEWS COMPARISON:  CT abdomen and pelvis 12/21/2016 FINDINGS: Pelvis: There is diffuse decreased bone mineralization. Mild-to-moderate bilateral femoroacetabular joint space narrowing and superolateral acetabular degenerative osteophytosis. Acute, oblique right intertrochanteric fracture with mild superior displacement of the distal fracture component. There is medial dislocation of the lesser trochanter. Right femur: Redemonstration of right femoral intertrochanteric fracture with approximately 2 cm superior displacement of the distal fracture component. There is medial displacement of the lesser trochanter. No additional acute fracture is seen within the more distal aspect of the right femur. Right knee: Postsurgical changes of total right knee arthroplasty. No perihardware lucency is seen to indicate hardware failure or loosening. Possible small knee joint effusion. Mild chronic enthesopathic change at the quadriceps insertion on the patella. IMPRESSION: 1. Acute right femoral intertrochanteric fracture with mild superior displacement of the distal fracture component. 2. Status post total right knee arthroplasty without evidence of hardware failure. Electronically Signed   By: Yvonne Kendall M.D.   On: 04/26/2022 10:57    Procedures Procedures    Medications Ordered in ED Medications  fentaNYL (SUBLIMAZE) injection 50 mcg (has no administration in time range)    ED Course/ Medical Decision Making/ A&P                           Medical Decision Making Risk Prescription drug management. Decision regarding hospitalization.   This patient presents to the ED with chief complaint(s) of fall with pertinent past medical history of  hypertension, no blood thinner use.The complaint involves an extensive differential diagnosis and also carries with it a high risk of complications and morbidity.    The differential diagnosis includes right hip fracture, hip dislocation, traumatic brain injury/subdural hematoma.  CT scan of the brain and x-ray of the hip interpreted independently.  Patient has no brain bleed.  She does have right hip fracture.   Additional history obtained: Reviewed records from care everywhere.  Patient really has not seen a doctor at [place], [place] or [place] since 2018. There is no emergency phone contact listed.  Independent labs interpretation:  The following labs were independently interpreted: Overall reassuring CBC, CMP.  Consultation: - Consulted or discussed management/test interpretation with external professional: Orthopedic surgery.  Discussed case with Hilbert Odor, who will see the patient from orthopedic perspective. Medicine consulted for admission.   Final Clinical Impression(s) / ED Diagnoses Final diagnoses:  Closed right hip fracture, initial encounter [facility])    Rx / DC Orders ED Discharge Orders  None         Derwood Kaplan, MD 04/26/22 1424    Derwood Kaplan, MD 04/26/22 1425

## 2022-04-26 NOTE — ED Notes (Signed)
PT refuses pain meds at this time

## 2022-04-26 NOTE — Consult Note (Signed)
Reason for Consult:Right hip fx Referring Physician: Derwood Kaplan Time called: 1349 Time at bedside: [address] Cassidy [name] is an 88 y.o. female.  HPI: Cassidy [name] fell and had right hip pain. She was brought to the ED where x-rays showed a right hip fx and orthopedic surgery was consulted. Circumstances of the fall are unclear as pt seems altered.  Past Medical History:  Diagnosis Date   Arthritis    Bradycardia    Chest pain    Chronic pulmonary disease    HTN (hypertension)    Osteoarthritis of knee    UTI (urinary tract infection)     Past Surgical History:  Procedure Laterality Date   ABDOMINAL HYSTERECTOMY     COLONOSCOPY WITH PROPOFOL N/A 06/06/2016   Procedure: COLONOSCOPY WITH PROPOFOL;  Surgeon: Bernette Redbird, MD;  Location[facility] ENDOSCOPY;  Service: Endoscopy;  Laterality: N/A;   ESOPHAGOGASTRODUODENOSCOPY (EGD) WITH PROPOFOL N/A 06/06/2016   Procedure: ESOPHAGOGASTRODUODENOSCOPY (EGD) WITH PROPOFOL;  Surgeon: Bernette Redbird, MD;  Location: [facility] ENDOSCOPY;  Service: Endoscopy;  Laterality: N/A;   TOTAL KNEE ARTHROPLASTY     TUBAL LIGATION      Family History  Adopted: Yes    Social History:  reports that she quit smoking about 5 years ago. Her smoking use included cigarettes. She has a 10.00 pack-year smoking history. She has never used smokeless tobacco. She reports that she does not drink alcohol and does not use drugs.  Allergies:  Allergies  Allergen Reactions   Ciprofloxacin Hcl     Present on the patient's MAR as an allergy, but she disputes it   Prednisone     Present on the patient's MAR as an allergy, but she disputes it   Zantac [Ranitidine] Other (See Comments)    "Too strong," per the patient    Medications: I have reviewed the patient's current medications.  Results for orders placed or performed during the hospital encounter of 04/26/22 (from the past 48 hour(s))  CBC WITH DIFFERENTIAL     Status: Abnormal   Collection Time: 04/26/22 10:09 AM   Result Value Ref Range   WBC 13.7 (H) 4.0 - 10.5 K/uL   RBC 3.65 (L) 3.87 - 5.11 MIL/uL   Hemoglobin 11.9 (L) 12.0 - 15.0 g/dL   HCT 50.0 (L) 93.8 - 18.2 %   MCV 97.0 80.0 - 100.0 fL   MCH 32.6 26.0 - 34.0 pg   MCHC 33.6 30.0 - 36.0 g/dL    Comment: CORRECTED FOR COLD AGGLUTININS   RDW 12.6 11.5 - 15.5 %   Platelets 378 150 - 400 K/uL   nRBC 0.0 0.0 - 0.2 %   Neutrophils Relative % 67 %   Neutro Abs 9.2 (H) 1.7 - 7.7 K/uL   Lymphocytes Relative 19 %   Lymphs Abs 2.6 0.7 - 4.0 K/uL   Monocytes Relative 13 %   Monocytes Absolute 1.7 (H) 0.1 - 1.0 K/uL   Eosinophils Relative 0 %   Eosinophils Absolute 0.1 0.0 - 0.5 K/uL   Basophils Relative 0 %   Basophils Absolute 0.0 0.0 - 0.1 K/uL   Immature Granulocytes 1 %   Abs Immature Granulocytes 0.11 (H) 0.00 - 0.07 K/uL    Comment: Performed at [facility], [address] [place], [place] [address]  Comprehensive metabolic panel     Status: Abnormal   Collection Time: 04/26/22 10:09 AM  Result Value Ref Range   Sodium 136 135 - 145 mmol/L   Potassium 4.4 3.5 -  5.1 mmol/L   Chloride 102 98 - 111 mmol/L   CO2 21 (L) 22 - 32 mmol/L   Glucose, Bld 122 (H) 70 - 99 mg/dL    Comment: Glucose reference range applies only to samples taken after fasting for at least 8 hours.   BUN 25 (H) 8 - 23 mg/dL   Creatinine, Ser 0.09 (H) 0.44 - 1.00 mg/dL   Calcium 9.5 8.9 - 38.1 mg/dL   Total Protein 6.6 6.5 - 8.1 g/dL   Albumin 3.3 (L) 3.5 - 5.0 g/dL   AST 31 15 - 41 U/L   ALT 21 0 - 44 U/L   Alkaline Phosphatase 45 38 - 126 U/L   Total Bilirubin 0.5 0.3 - 1.2 mg/dL   GFR, Estimated 48 (L) >60 mL/min    Comment: (NOTE) Calculated using the CKD-EPI Creatinine Equation (2021)    Anion gap 13 5 - 15    Comment: Performed at [facility], [address], [place], [place] [address]    CT HEAD WO CONTRAST  Result Date: 04/26/2022 CLINICAL DATA:  Memory loss, fall. EXAM: CT HEAD WITHOUT CONTRAST TECHNIQUE: Contiguous axial images  were obtained from the base of the skull through the vertex without intravenous contrast. RADIATION DOSE REDUCTION: This exam was performed according to the departmental dose-optimization program which includes automated exposure control, adjustment of the mA and/or kV according to patient size and/or use of iterative reconstruction technique. COMPARISON:  March 28, 2018. FINDINGS: Brain: No evidence of acute infarction, hemorrhage, hydrocephalus, extra-axial collection or mass lesion/mass effect. Vascular: No hyperdense vessel or unexpected calcification. Skull: Normal. Negative for fracture or focal lesion. Sinuses/Orbits: No acute finding. Other: None. IMPRESSION: No acute intracranial abnormality seen. Electronically Signed   By: Lupita Raider M.D.   On: 04/26/2022 11:26   DG Knee Complete 4 Views Right  Result Date: 04/26/2022 CLINICAL DATA:  Fall.  Pain. EXAM: PELVIS - 1-2 VIEW; RIGHT KNEE - COMPLETE 4+ VIEW; RIGHT FEMUR 2 VIEWS COMPARISON:  CT abdomen and pelvis 12/21/2016 FINDINGS: Pelvis: There is diffuse decreased bone mineralization. Mild-to-moderate bilateral femoroacetabular joint space narrowing and superolateral acetabular degenerative osteophytosis. Acute, oblique right intertrochanteric fracture with mild superior displacement of the distal fracture component. There is medial dislocation of the lesser trochanter. Right femur: Redemonstration of right femoral intertrochanteric fracture with approximately 2 cm superior displacement of the distal fracture component. There is medial displacement of the lesser trochanter. No additional acute fracture is seen within the more distal aspect of the right femur. Right knee: Postsurgical changes of total right knee arthroplasty. No perihardware lucency is seen to indicate hardware failure or loosening. Possible small knee joint effusion. Mild chronic enthesopathic change at the quadriceps insertion on the patella. IMPRESSION: 1. Acute right femoral  intertrochanteric fracture with mild superior displacement of the distal fracture component. 2. Status post total right knee arthroplasty without evidence of hardware failure. Electronically Signed   By: Neita Garnet M.D.   On: 04/26/2022 10:57   DG FEMUR, MIN 2 VIEWS RIGHT  Result Date: 04/26/2022 CLINICAL DATA:  Fall.  Pain. EXAM: PELVIS - 1-2 VIEW; RIGHT KNEE - COMPLETE 4+ VIEW; RIGHT FEMUR 2 VIEWS COMPARISON:  CT abdomen and pelvis 12/21/2016 FINDINGS: Pelvis: There is diffuse decreased bone mineralization. Mild-to-moderate bilateral femoroacetabular joint space narrowing and superolateral acetabular degenerative osteophytosis. Acute, oblique right intertrochanteric fracture with mild superior displacement of the distal fracture component. There is medial dislocation of the lesser trochanter. Right femur: Redemonstration of right femoral intertrochanteric  fracture with approximately 2 cm superior displacement of the distal fracture component. There is medial displacement of the lesser trochanter. No additional acute fracture is seen within the more distal aspect of the right femur. Right knee: Postsurgical changes of total right knee arthroplasty. No perihardware lucency is seen to indicate hardware failure or loosening. Possible small knee joint effusion. Mild chronic enthesopathic change at the quadriceps insertion on the patella. IMPRESSION: 1. Acute right femoral intertrochanteric fracture with mild superior displacement of the distal fracture component. 2. Status post total right knee arthroplasty without evidence of hardware failure. Electronically Signed   By: Yvonne Kendall M.D.   On: 04/26/2022 10:57   DG Pelvis 1-2 Views  Result Date: 04/26/2022 CLINICAL DATA:  Fall.  Pain. EXAM: PELVIS - 1-2 VIEW; RIGHT KNEE - COMPLETE 4+ VIEW; RIGHT FEMUR 2 VIEWS COMPARISON:  CT abdomen and pelvis 12/21/2016 FINDINGS: Pelvis: There is diffuse decreased bone mineralization. Mild-to-moderate bilateral  femoroacetabular joint space narrowing and superolateral acetabular degenerative osteophytosis. Acute, oblique right intertrochanteric fracture with mild superior displacement of the distal fracture component. There is medial dislocation of the lesser trochanter. Right femur: Redemonstration of right femoral intertrochanteric fracture with approximately 2 cm superior displacement of the distal fracture component. There is medial displacement of the lesser trochanter. No additional acute fracture is seen within the more distal aspect of the right femur. Right knee: Postsurgical changes of total right knee arthroplasty. No perihardware lucency is seen to indicate hardware failure or loosening. Possible small knee joint effusion. Mild chronic enthesopathic change at the quadriceps insertion on the patella. IMPRESSION: 1. Acute right femoral intertrochanteric fracture with mild superior displacement of the distal fracture component. 2. Status post total right knee arthroplasty without evidence of hardware failure. Electronically Signed   By: Yvonne Kendall M.D.   On: 04/26/2022 10:57    Review of Systems  Unable to perform ROS: Mental status change  Musculoskeletal:  Positive for arthralgias (Right hip).   Blood pressure (!) 131/111, pulse 99, temperature 98.1 F (36.7 C), resp. rate 17, SpO2 99 %. Physical Exam Constitutional:      General: She is not in acute distress.    Appearance: She is well-developed. She is not diaphoretic.  HENT:     Head: Normocephalic and atraumatic.  Eyes:     General: No scleral icterus.       Right eye: No discharge.        Left eye: No discharge.     Conjunctiva/sclera: Conjunctivae normal.  Cardiovascular:     Rate and Rhythm: Normal rate and regular rhythm.  Pulmonary:     Effort: Pulmonary effort is normal. No respiratory distress.  Musculoskeletal:     Cervical back: Normal range of motion.     Comments: RLE No traumatic wounds, ecchymosis, or rash  Mod TTP  hip  No knee or ankle effusion  Knee stable to varus/ valgus and anterior/posterior stress  Sens DPN, SPN, TN intact  Motor EHL, ext, flex, evers 5/5  DP 1+, PT 1+, No significant edema  Skin:    General: Skin is warm and dry.  Neurological:     Mental Status: She is alert.  Psychiatric:        Mood and Affect: Mood normal.        Behavior: Behavior normal.     Assessment/Plan: Right hip fx -- Plan IMN tomorrow with Dr. Doreatha Martin. Please keep NPO after MN.    Lisette Abu, PA-C Orthopedic Surgery [phone] 04/26/2022, 2:20 PM

## 2022-04-26 NOTE — ED Triage Notes (Addendum)
EMS stated, she had a fall and is in pain . No LOC pain in rt. Leg. No deformity or swelling. Does not take any blood thinners. It was she was having oral sex when this happened. Pt is shaking with mostly her right hand uncontrollable. She said this is abnormal.

## 2022-04-26 NOTE — Progress Notes (Signed)
Orthopedic Tech Progress Note Patient Details:  Cassidy [name] 1934/03/03 [id number]  Ortho Devices Type of Ortho Device: Knee Immobilizer Ortho Device/Splint Location: RLE Ortho Device/Splint Interventions: Ordered, Application, Adjustment   Post Interventions Patient Tolerated: Fair Instructions Provided: Care of device  [name] 04/26/2022, 4:03 PM

## 2022-04-26 NOTE — Progress Notes (Signed)
Attempted to call [place], legal guardian, x4 with no answer, voicemail is full. [place] for secondary number and no additional number on file. Patient will return to ED and will not receive block.

## 2022-04-26 NOTE — ED Notes (Signed)
PT was transported to PACU for nerve block by Alexa, RN

## 2022-04-26 NOTE — Progress Notes (Signed)
According to [facility] (Director of [place]) Cassidy [name] is the legal guardian [phone].

## 2022-04-27 ENCOUNTER — Other Ambulatory Visit: Payer: Self-pay

## 2022-04-27 ENCOUNTER — Encounter (HOSPITAL_COMMUNITY)
Admission: EM | Disposition: A | Discharge status: Skilled Nursing Facility | Payer: Self-pay | Source: Home / Self Care | Attending: Internal Medicine

## 2022-04-27 ENCOUNTER — Inpatient Hospital Stay (HOSPITAL_COMMUNITY): Payer: Medicare Other | Admitting: Anesthesiology

## 2022-04-27 ENCOUNTER — Encounter (HOSPITAL_COMMUNITY): Payer: Self-pay | Admitting: Internal Medicine

## 2022-04-27 ENCOUNTER — Inpatient Hospital Stay (HOSPITAL_COMMUNITY): Payer: Medicare Other

## 2022-04-27 DIAGNOSIS — M1990 Unspecified osteoarthritis, unspecified site: Secondary | ICD-10-CM

## 2022-04-27 DIAGNOSIS — S72141A Displaced intertrochanteric fracture of right femur, initial encounter for closed fracture: Secondary | ICD-10-CM

## 2022-04-27 DIAGNOSIS — F0390 Unspecified dementia without behavioral disturbance: Secondary | ICD-10-CM

## 2022-04-27 DIAGNOSIS — S72001A Fracture of unspecified part of neck of right femur, initial encounter for closed fracture: Secondary | ICD-10-CM | POA: Diagnosis present

## 2022-04-27 DIAGNOSIS — I10 Essential (primary) hypertension: Secondary | ICD-10-CM

## 2022-04-27 HISTORY — PX: INTRAMEDULLARY (IM) NAIL INTERTROCHANTERIC: SHX5875

## 2022-04-27 LAB — CBC
HCT: 24.2 % — ABNORMAL LOW (ref 36.0–46.0)
Hemoglobin: 8.2 g/dL — ABNORMAL LOW (ref 12.0–15.0)
MCH: 33.3 pg (ref 26.0–34.0)
MCHC: 33.9 g/dL (ref 30.0–36.0)
MCV: 98.4 fL (ref 80.0–100.0)
Platelets: 299 10*3/uL (ref 150–400)
RBC: 2.46 MIL/uL — ABNORMAL LOW (ref 3.87–5.11)
RDW: 13.2 % (ref 11.5–15.5)
WBC: 19.1 10*3/uL — ABNORMAL HIGH (ref 4.0–10.5)
nRBC: 0 % (ref 0.0–0.2)

## 2022-04-27 LAB — SURGICAL PCR SCREEN
MRSA, PCR: NEGATIVE
Staphylococcus aureus: NEGATIVE

## 2022-04-27 SURGERY — FIXATION, FRACTURE, INTERTROCHANTERIC, WITH INTRAMEDULLARY ROD
Anesthesia: General | Site: Thigh | Laterality: Right

## 2022-04-27 MED ORDER — ONDANSETRON HCL 4 MG/2ML IJ SOLN
INTRAMUSCULAR | Status: DC | PRN
  Administered 2022-04-27: 4 mg via INTRAVENOUS

## 2022-04-27 MED ORDER — 0.9 % SODIUM CHLORIDE (POUR BTL) OPTIME
TOPICAL | Status: DC | PRN
  Administered 2022-04-27: 1000 mL

## 2022-04-27 MED ORDER — MENTHOL 3 MG MT LOZG: 1.0000 | LOZENGE | OROMUCOSAL | Status: DC | PRN

## 2022-04-27 MED ORDER — DOCUSATE SODIUM 100 MG PO CAPS
100.0000 mg | ORAL_CAPSULE | Freq: Two times a day (BID) | ORAL | Status: DC
  Administered 2022-04-27 – 2022-05-03 (×9): 100 mg via ORAL
  Filled 2022-04-27 (×10): qty 1

## 2022-04-27 MED ORDER — ENOXAPARIN SODIUM 40 MG/0.4ML IJ SOSY
40.0000 mg | PREFILLED_SYRINGE | INTRAMUSCULAR | Status: DC
  Administered 2022-04-28: 40 mg via SUBCUTANEOUS
  Filled 2022-04-27: qty 0.4

## 2022-04-27 MED ORDER — PHENOL 1.4 % MT LIQD: 1.0000 | OROMUCOSAL | Status: DC | PRN

## 2022-04-27 MED ORDER — FENTANYL CITRATE (PF) 250 MCG/5ML IJ SOLN
INTRAMUSCULAR | Status: AC
  Filled 2022-04-27: qty 5

## 2022-04-27 MED ORDER — TRANEXAMIC ACID-NACL 1000-0.7 MG/100ML-% IV SOLN
1000.0000 mg | Freq: Once | INTRAVENOUS | Status: AC
  Administered 2022-04-27: 1000 mg via INTRAVENOUS
  Filled 2022-04-27: qty 100

## 2022-04-27 MED ORDER — ROCURONIUM BROMIDE 10 MG/ML (PF) SYRINGE
PREFILLED_SYRINGE | INTRAVENOUS | Status: DC | PRN
  Administered 2022-04-27: 50 mg via INTRAVENOUS

## 2022-04-27 MED ORDER — ONDANSETRON HCL 4 MG/2ML IJ SOLN: 4.0000 mg | Freq: Four times a day (QID) | INTRAMUSCULAR | Status: DC | PRN

## 2022-04-27 MED ORDER — LIDOCAINE 2% (20 MG/ML) 5 ML SYRINGE
INTRAMUSCULAR | Status: DC | PRN
  Administered 2022-04-27: 40 mg via INTRAVENOUS

## 2022-04-27 MED ORDER — SUGAMMADEX SODIUM 200 MG/2ML IV SOLN
INTRAVENOUS | Status: DC | PRN
  Administered 2022-04-27: 200 mg via INTRAVENOUS

## 2022-04-27 MED ORDER — OXYCODONE HCL 5 MG PO TABS: 5.0000 mg | ORAL_TABLET | Freq: Once | ORAL | Status: DC | PRN

## 2022-04-27 MED ORDER — METOCLOPRAMIDE HCL 5 MG PO TABS: 5.0000 mg | ORAL_TABLET | Freq: Three times a day (TID) | ORAL | Status: DC | PRN

## 2022-04-27 MED ORDER — VANCOMYCIN HCL 1000 MG IV SOLR
INTRAVENOUS | Status: AC
  Filled 2022-04-27: qty 20

## 2022-04-27 MED ORDER — CHLORHEXIDINE GLUCONATE 0.12 % MT SOLN
OROMUCOSAL | Status: AC
  Administered 2022-04-27: 15 mL
  Filled 2022-04-27: qty 15

## 2022-04-27 MED ORDER — POVIDONE-IODINE 10 % EX SWAB: 2.0000 | Freq: Once | CUTANEOUS | Status: DC

## 2022-04-27 MED ORDER — FENTANYL CITRATE (PF) 100 MCG/2ML IJ SOLN
INTRAMUSCULAR | Status: AC
  Filled 2022-04-27: qty 2

## 2022-04-27 MED ORDER — PROPOFOL 10 MG/ML IV BOLUS
INTRAVENOUS | Status: AC
  Filled 2022-04-27: qty 20

## 2022-04-27 MED ORDER — OXYCODONE HCL 5 MG/5ML PO SOLN: 5.0000 mg | Freq: Once | ORAL | Status: DC | PRN

## 2022-04-27 MED ORDER — PHENYLEPHRINE HCL-NACL 20-0.9 MG/250ML-% IV SOLN
INTRAVENOUS | Status: DC | PRN
  Administered 2022-04-27: 40 ug/min via INTRAVENOUS

## 2022-04-27 MED ORDER — PROPOFOL 10 MG/ML IV BOLUS
INTRAVENOUS | Status: DC | PRN
  Administered 2022-04-27: 90 mg via INTRAVENOUS

## 2022-04-27 MED ORDER — CHLORHEXIDINE GLUCONATE 4 % EX LIQD: 60.0000 mL | Freq: Once | CUTANEOUS | Status: DC

## 2022-04-27 MED ORDER — LACTATED RINGERS IV SOLN: INTRAVENOUS | Status: DC | PRN

## 2022-04-27 MED ORDER — CEFAZOLIN SODIUM-DEXTROSE 2-4 GM/100ML-% IV SOLN
2.0000 g | Freq: Three times a day (TID) | INTRAVENOUS | Status: AC
  Administered 2022-04-27 – 2022-04-28 (×3): 2 g via INTRAVENOUS
  Filled 2022-04-27 (×3): qty 100

## 2022-04-27 MED ORDER — FENTANYL CITRATE (PF) 250 MCG/5ML IJ SOLN
INTRAMUSCULAR | Status: DC | PRN
  Administered 2022-04-27 (×4): 25 ug via INTRAVENOUS

## 2022-04-27 MED ORDER — CEFAZOLIN SODIUM-DEXTROSE 2-4 GM/100ML-% IV SOLN
2.0000 g | INTRAVENOUS | Status: DC
  Filled 2022-04-27: qty 100

## 2022-04-27 MED ORDER — EPHEDRINE SULFATE-NACL 50-0.9 MG/10ML-% IV SOSY
PREFILLED_SYRINGE | INTRAVENOUS | Status: DC | PRN
  Administered 2022-04-27: 5 mg via INTRAVENOUS

## 2022-04-27 MED ORDER — FENTANYL CITRATE (PF) 100 MCG/2ML IJ SOLN
25.0000 ug | INTRAMUSCULAR | Status: DC | PRN
  Administered 2022-04-27 (×2): 50 ug via INTRAVENOUS

## 2022-04-27 MED ORDER — METOCLOPRAMIDE HCL 5 MG/ML IJ SOLN: 5.0000 mg | Freq: Three times a day (TID) | INTRAMUSCULAR | Status: DC | PRN

## 2022-04-27 MED ORDER — PHENYLEPHRINE 80 MCG/ML (10ML) SYRINGE FOR IV PUSH (FOR BLOOD PRESSURE SUPPORT)
PREFILLED_SYRINGE | INTRAVENOUS | Status: DC | PRN
  Administered 2022-04-27: 160 ug via INTRAVENOUS

## 2022-04-27 MED ORDER — ONDANSETRON HCL 4 MG PO TABS
4.0000 mg | ORAL_TABLET | Freq: Four times a day (QID) | ORAL | Status: DC | PRN
  Administered 2022-05-01: 4 mg via ORAL
  Filled 2022-04-27: qty 1

## 2022-04-27 SURGICAL SUPPLY — 48 items
BAG COUNTER SPONGE SURGICOUNT (BAG) IMPLANT
BIT DRILL INTERTAN LAG SCREW (BIT) IMPLANT
BIT DRILL LONG 4.0 (BIT) IMPLANT
BRUSH SCRUB EZ PLAIN DRY (MISCELLANEOUS) ×2 IMPLANT
CHLORAPREP W/TINT 26 (MISCELLANEOUS) ×1 IMPLANT
COVER PERINEAL POST (MISCELLANEOUS) ×1 IMPLANT
COVER SURGICAL LIGHT HANDLE (MISCELLANEOUS) ×1 IMPLANT
DERMABOND ADVANCED .7 DNX12 (GAUZE/BANDAGES/DRESSINGS) ×1 IMPLANT
DRAPE C-ARM 35X43 STRL (DRAPES) ×1 IMPLANT
DRAPE IMP U-DRAPE 54X76 (DRAPES) ×2 IMPLANT
DRAPE INCISE IOBAN 66X45 STRL (DRAPES) ×1 IMPLANT
DRAPE STERI IOBAN 125X83 (DRAPES) ×1 IMPLANT
DRAPE SURG 17X23 STRL (DRAPES) ×2 IMPLANT
DRAPE U-SHAPE 47X51 STRL (DRAPES) ×1 IMPLANT
DRESSING MEPILEX FLEX 4X4 (GAUZE/BANDAGES/DRESSINGS) IMPLANT
DRILL BIT LONG 4.0 (BIT) ×1
DRSG MEPILEX BORDER 4X4 (GAUZE/BANDAGES/DRESSINGS) ×1 IMPLANT
DRSG MEPILEX BORDER 4X8 (GAUZE/BANDAGES/DRESSINGS) ×1 IMPLANT
DRSG MEPILEX FLEX 4X4 (GAUZE/BANDAGES/DRESSINGS) ×1
DRSG MEPILEX POST OP 4X8 (GAUZE/BANDAGES/DRESSINGS) IMPLANT
ELECT REM PT RETURN 9FT ADLT (ELECTROSURGICAL) ×1
ELECTRODE REM PT RTRN 9FT ADLT (ELECTROSURGICAL) ×1 IMPLANT
GLOVE BIO SURGEON STRL SZ 6.5 (GLOVE) ×3 IMPLANT
GLOVE BIO SURGEON STRL SZ7.5 (GLOVE) ×4 IMPLANT
GLOVE BIOGEL PI IND STRL 6.5 (GLOVE) ×1 IMPLANT
GLOVE BIOGEL PI IND STRL 7.5 (GLOVE) ×1 IMPLANT
GOWN STRL REUS W/ TWL LRG LVL3 (GOWN DISPOSABLE) ×1 IMPLANT
GOWN STRL REUS W/TWL LRG LVL3 (GOWN DISPOSABLE) ×1
GUIDE PIN 3.2X343 (PIN) ×2
GUIDE PIN 3.2X343MM (PIN) ×2
KIT BASIN OR (CUSTOM PROCEDURE TRAY) ×1 IMPLANT
KIT TURNOVER KIT B (KITS) ×1 IMPLANT
MANIFOLD NEPTUNE II (INSTRUMENTS) ×1 IMPLANT
NAIL INTERTAN 10X18 130D 10S (Nail) IMPLANT
NS IRRIG 1000ML POUR BTL (IV SOLUTION) ×1 IMPLANT
PACK GENERAL/GYN (CUSTOM PROCEDURE TRAY) ×1 IMPLANT
PAD ARMBOARD 7.5X6 YLW CONV (MISCELLANEOUS) ×2 IMPLANT
PIN GUIDE 3.2X343MM (PIN) IMPLANT
SCREW LAG COMPR KIT 110/105 (Screw) IMPLANT
SCREW TRIGEN LOW PROF 5.0X35 (Screw) IMPLANT
SUT MNCRL AB 3-0 PS2 18 (SUTURE) ×1 IMPLANT
SUT VIC AB 0 CT1 27 (SUTURE)
SUT VIC AB 0 CT1 27XBRD ANBCTR (SUTURE) IMPLANT
SUT VIC AB 2-0 CT1 27 (SUTURE)
SUT VIC AB 2-0 CT1 TAPERPNT 27 (SUTURE) ×2 IMPLANT
SUT VIC AB 2-0 CTB1 (SUTURE) IMPLANT
TOWEL GREEN STERILE (TOWEL DISPOSABLE) ×2 IMPLANT
WATER STERILE IRR 1000ML POUR (IV SOLUTION) ×1 IMPLANT

## 2022-04-27 NOTE — Op Note (Signed)
Orthopaedic Surgery Operative Note (CSN: [id number] ) Date of Surgery: 04/27/2022  Admit Date: 04/26/2022   Diagnoses: Pre-Op Diagnoses: Right intertrochanteric femur fracture   Post-Op Diagnosis: Same  Procedures: CPT 27245-Cephalomedullary nailing of right intertrochanteric femur fracture  Surgeons : Primary: Shona Needles, MD  Assistant: Patrecia Pace, PA-C  Location: OR 3   Anesthesia:General   Antibiotics: Ancef 2g preop   Tourniquet time: None    Estimated Blood Loss: 425 mL  Complications:None  Specimens:None   Implants: Implant Name Type Inv. Item Serial No. Manufacturer Lot No. LRB No. Used Action  NAIL INTERTAN 10X18 130D 10S - [id number] Nail NAIL INTERTAN 10X18 130D 10S  [employer] [id number] Right 1 Implanted  SCREW LAG COMPR KIT 110/105 - [id number] Screw SCREW LAG COMPR KIT 110/105  [employer] [id number] Right 1 Implanted  SCREW TRIGEN LOW PROF 5.0X35 - [id number] Screw SCREW TRIGEN LOW PROF 5.0X35  [employer] [id number] Right 1 Implanted     Indications for Surgery: 88 year old female sustained a ground-level fall with a right intertrochanteric femur fracture.  Due to the unstable nature of her injury I recommend proceeding with cephalomedullary nailing.  Risks and benefits were discussed with the patient's legal guardian.  Risks include but not limited to bleeding, infection, malunion, nonunion, hardware failure, hardware irritation, nerve blood vessel injury, DVT, even the possibility anesthetic complications.  They agreed to proceed with surgery and consent was obtained.  Operative Findings: Cephalomedullary nailing of right intertrochanteric femur fracture using [employer] InterTAN 10 x 180 mm nail with a 110 mm lag screw and a 105 mm compression screw.  Procedure: The patient was identified in the preoperative holding area. Consent was confirmed with the patient and their family and all questions  were answered. The operative extremity was marked after confirmation with the patient. she was then brought back to the operating room by our anesthesia colleagues.  She was placed under general anesthetic and carefully transferred over to a Hana table.  All bony prominences were well-padded.  Traction was applied to the right lower extremity through the table.  Fluoroscopic imaging showed adequate reduction.  The right lower extremity was then prepped and draped in usual sterile fashion.  A timeout was performed to verify the patient, the procedure, and the extremity.  Preoperative antibiotics were dosed.  Small incision proximal to the greater trochanter was needed.  Down through skin and subcutaneous tissue.  A threaded guidewire was placed at the tip of the greater trochanter and advanced into the proximal metaphysis.  I confirmed positioning of the guidewire then used the entry reamer to enter the medullary canal.  I then passed a 10 x 180 mm nail down the center of the canal.  I used the targeting arm to direct a threaded guidewire into the head/neck segment.  I confirmed adequate tip apex distance.  I measured this and decided to place a 110 mm screw.  The compression screw path was drilled and the lag screw path was drilled as well.  An antirotation bar was placed in the compression screw path.  The lag screw was placed and I compressed approximately 1 cm.  I got good apposition of the fracture site.  I then statically locked the nail proximally.  I then used the targeting arm to place a distal interlocking screw.  The targeting arm was removed.  Final fluoroscopic imaging was obtained.  The incision was copiously irrigated.  Layered closure of 2-0 Vicryl 3-0 Monocryl  and Dermabond was used to close the skin.  Sterile dressings were applied.  The patient was then awoke from anesthesia and taken to the PACU in stable condition.  Post Op Plan/Instructions: Patient will be weightbearing as tolerated to  the right lower extremity.  She will receive postoperative Ancef.  She will receive Lovenox while inpatient and discharged on aspirin 325 mg daily.  We will have her mobilize with physical and Occupational Therapy.  I was present and performed the entire surgery.  Patrecia Pace, PA-C did assist me throughout the case. An assistant was necessary given the difficulty in approach, maintenance of reduction and ability to instrument the fracture.   Katha Hamming, MD [facility]

## 2022-04-27 NOTE — Anesthesia Preprocedure Evaluation (Signed)
Anesthesia Evaluation  Patient identified by MRN, date of birth, ID band Patient awake    Reviewed: Allergy & Precautions, H&P , NPO status , Patient's Chart, lab work & pertinent test results  Airway Mallampati: II   Neck ROM: full    Dental   Pulmonary former smoker,    breath sounds clear to auscultation       Cardiovascular hypertension,  Rhythm:regular Rate:Normal     Neuro/Psych PSYCHIATRIC DISORDERS Dementia    GI/Hepatic   Endo/Other    Renal/GU      Musculoskeletal  (+) Arthritis ,   Abdominal   Peds  Hematology   Anesthesia Other Findings   Reproductive/Obstetrics                             Anesthesia Physical Anesthesia Plan  ASA: 3  Anesthesia Plan: General   Post-op Pain Management:    Induction: Intravenous  PONV Risk Score and Plan: 3 and Ondansetron, Dexamethasone and Treatment may vary due to age or medical condition  Airway Management Planned: Oral ETT  Additional Equipment:   Intra-op Plan:   Post-operative Plan: Extubation in OR  Informed Consent: I have reviewed the patients History and Physical, chart, labs and discussed the procedure including the risks, benefits and alternatives for the proposed anesthesia with the patient or authorized representative who has indicated his/her understanding and acceptance.     Dental advisory given  Plan Discussed with: CRNA, Anesthesiologist and Surgeon  Anesthesia Plan Comments:         Anesthesia Quick Evaluation

## 2022-04-27 NOTE — ED Notes (Signed)
Patient resting, eyes closed.  Respirations even and unlabored.  Patient appears to be comfortable, where she was extremely restless prior to PRN pain med admin.

## 2022-04-27 NOTE — Progress Notes (Signed)
OT Cancellation Note  Patient Details Name: Cassidy [name] MRN: [id number] DOB: 1933-10-23   Cancelled Treatment:    Reason Eval/Treat Not Completed: Pain limiting ability to participate. Unable to move R leg due to pain, very confused and difficulty following commands.  Held this date to attempt next date as appropriate.    Nicholas Trompeter D Anabela Crayton 04/27/2022, 5:10 PM 04/27/2022  RP, OTR/L  Acute Rehabilitation Services  Office:  [phone]

## 2022-04-27 NOTE — Progress Notes (Signed)
PROGRESS NOTE  Cassidy [name]  [id number] DOB: Jan 29, 1934 DOA: 04/26/2022 PCP: Patient, No Pcp Per   Brief Narrative:  Patient is a 88 year old female with history of hypertension, COPD, osteoarthritis, dementia who presented with right hip pain from [facility] facility after a fall.  Patient was confused ,unable to obtain significant history.  She is under legal guardianship of [facility].  On presentation, she was hemodynamically stable.  Lab work showed WBC of 13.7, creatinine of 1.1.  X-ray showed right femoral intertrochanteric fracture with mild superior displacement of the distal fracture component, right knee arthroplasty with intact hardware.  CT head did not show any acute findings.  Orthopedics consulted.  Plan for ORIF.  Assessment & Plan:  Principal Problem:   Closed intertrochanteric fracture of right hip [facility]) Active Problems:   Fall   Leukocytosis   Right bundle branch block   Dementia with behavioral disturbance (HCC)   Renal insufficiency   Right intertrochanteric fracture secondary to fall: Developed right hip pain after she fell.  X-rays revealed an acute right femoral intertrochanteric fracture with mild superior displacement of the distal fracture component.  Continue pain management, supportive care.  DVT prophylaxis, PT/OT evaluation after surgery. Plan for intramedullary nailing.  Leukocytosis: Likely reactive.  Continue to monitor  AKI: Creatinine of 1.1.  Continue to monitor.  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  Last echo showed EF of 55 to 16%, grade 1 diastolic dysfunction.  Dementia with behavioral disturbance: Has dementia at baseline.  Continue Aricept, Paxil, Depakote          DVT prophylaxis:enoxaparin (LOVENOX) injection 40 mg Start: 04/26/22 2200     Code Status: Full Code  Family Communication: None at bedside  Patient  status:Inpatient  Patient is from :SNF  Anticipated discharge to:SNF  Estimated DC date:2-3 days   Consultants: Ortho  Procedures:Plan for ORIF  Antimicrobials:  Anti-infectives (From admission, onward)    None       Subjective: Patient seen and examined at the bedside today at PACU.  She was being sent to the OR.  She looks comfortable, hemodynamically stable.  Confused, did not participate in any meaningful conversation  Objective: Vitals:   04/27/22 0325 04/27/22 0343 04/27/22 0545 04/27/22 0700  BP: (!) 129/39  (!) 150/44 (!) 120/51  Pulse: 97  73 83  Resp: 20  13 17   Temp:  97.6 F (36.4 C)    TempSrc:  Axillary    SpO2: 100%  97% 97%   No intake or output data in the 24 hours ending 04/27/22 0818 There were no vitals filed for this visit.  Examination:  General exam: Overall comfortable, not in distress,confused HEENT: PERRL Respiratory system:  no wheezes or crackles  Cardiovascular system: S1 & S2 heard, RRR.  Gastrointestinal system: Abdomen is nondistended, soft and nontender. Central nervous system: Alert and oriented Extremities: No edema, no clubbing ,no cyanosis,tenderness on the right hip Skin: No rashes, no ulcers,no icterus     Data Reviewed: I have personally reviewed following labs and imaging studies  CBC: Recent Labs  Lab 04/26/22 1009  WBC 13.7*  NEUTROABS 9.2*  HGB 11.9*  HCT 35.4*  MCV 97.0  PLT 109   Basic Metabolic Panel: Recent Labs  Lab 04/26/22 1009  NA 136  K 4.4  CL 102  CO2 21*  GLUCOSE 122*  BUN 25*  CREATININE 1.12*  CALCIUM 9.5  No results found for this or any previous visit (from the past 240 hour(s)).   Radiology Studies: DG Chest 1 View  Result Date: 04/26/2022 CLINICAL DATA:  Status post fall. EXAM: CHEST  1 VIEW COMPARISON:  Chest radiograph April 01, 2018. FINDINGS: Normal cardiac and mediastinal contours. Aortic atherosclerosis. Low lung volume exam. Basilar heterogeneous opacities. No  pleural effusion or pneumothorax. IMPRESSION: Low lung volumes with basilar atelectasis. Electronically Signed   By: Annia Belt M.D.   On: 04/26/2022 18:50   CT HEAD WO CONTRAST  Result Date: 04/26/2022 CLINICAL DATA:  Memory loss, fall. EXAM: CT HEAD WITHOUT CONTRAST TECHNIQUE: Contiguous axial images were obtained from the base of the skull through the vertex without intravenous contrast. RADIATION DOSE REDUCTION: This exam was performed according to the departmental dose-optimization program which includes automated exposure control, adjustment of the mA and/or kV according to patient size and/or use of iterative reconstruction technique. COMPARISON:  March 28, 2018. FINDINGS: Brain: No evidence of acute infarction, hemorrhage, hydrocephalus, extra-axial collection or mass lesion/mass effect. Vascular: No hyperdense vessel or unexpected calcification. Skull: Normal. Negative for fracture or focal lesion. Sinuses/Orbits: No acute finding. Other: None. IMPRESSION: No acute intracranial abnormality seen. Electronically Signed   By: Lupita Raider M.D.   On: 04/26/2022 11:26   DG Knee Complete 4 Views Right  Result Date: 04/26/2022 CLINICAL DATA:  Fall.  Pain. EXAM: PELVIS - 1-2 VIEW; RIGHT KNEE - COMPLETE 4+ VIEW; RIGHT FEMUR 2 VIEWS COMPARISON:  CT abdomen and pelvis 12/21/2016 FINDINGS: Pelvis: There is diffuse decreased bone mineralization. Mild-to-moderate bilateral femoroacetabular joint space narrowing and superolateral acetabular degenerative osteophytosis. Acute, oblique right intertrochanteric fracture with mild superior displacement of the distal fracture component. There is medial dislocation of the lesser trochanter. Right femur: Redemonstration of right femoral intertrochanteric fracture with approximately 2 cm superior displacement of the distal fracture component. There is medial displacement of the lesser trochanter. No additional acute fracture is seen within the more distal aspect of  the right femur. Right knee: Postsurgical changes of total right knee arthroplasty. No perihardware lucency is seen to indicate hardware failure or loosening. Possible small knee joint effusion. Mild chronic enthesopathic change at the quadriceps insertion on the patella. IMPRESSION: 1. Acute right femoral intertrochanteric fracture with mild superior displacement of the distal fracture component. 2. Status post total right knee arthroplasty without evidence of hardware failure. Electronically Signed   By: Neita Garnet M.D.   On: 04/26/2022 10:57   DG FEMUR, MIN 2 VIEWS RIGHT  Result Date: 04/26/2022 CLINICAL DATA:  Fall.  Pain. EXAM: PELVIS - 1-2 VIEW; RIGHT KNEE - COMPLETE 4+ VIEW; RIGHT FEMUR 2 VIEWS COMPARISON:  CT abdomen and pelvis 12/21/2016 FINDINGS: Pelvis: There is diffuse decreased bone mineralization. Mild-to-moderate bilateral femoroacetabular joint space narrowing and superolateral acetabular degenerative osteophytosis. Acute, oblique right intertrochanteric fracture with mild superior displacement of the distal fracture component. There is medial dislocation of the lesser trochanter. Right femur: Redemonstration of right femoral intertrochanteric fracture with approximately 2 cm superior displacement of the distal fracture component. There is medial displacement of the lesser trochanter. No additional acute fracture is seen within the more distal aspect of the right femur. Right knee: Postsurgical changes of total right knee arthroplasty. No perihardware lucency is seen to indicate hardware failure or loosening. Possible small knee joint effusion. Mild chronic enthesopathic change at the quadriceps insertion on the patella. IMPRESSION: 1. Acute right femoral intertrochanteric fracture with mild superior displacement of the distal fracture component. 2. Status  post total right knee arthroplasty without evidence of hardware failure. Electronically Signed   By: Neita Garnet M.D.   On: 04/26/2022  10:57   DG Pelvis 1-2 Views  Result Date: 04/26/2022 CLINICAL DATA:  Fall.  Pain. EXAM: PELVIS - 1-2 VIEW; RIGHT KNEE - COMPLETE 4+ VIEW; RIGHT FEMUR 2 VIEWS COMPARISON:  CT abdomen and pelvis 12/21/2016 FINDINGS: Pelvis: There is diffuse decreased bone mineralization. Mild-to-moderate bilateral femoroacetabular joint space narrowing and superolateral acetabular degenerative osteophytosis. Acute, oblique right intertrochanteric fracture with mild superior displacement of the distal fracture component. There is medial dislocation of the lesser trochanter. Right femur: Redemonstration of right femoral intertrochanteric fracture with approximately 2 cm superior displacement of the distal fracture component. There is medial displacement of the lesser trochanter. No additional acute fracture is seen within the more distal aspect of the right femur. Right knee: Postsurgical changes of total right knee arthroplasty. No perihardware lucency is seen to indicate hardware failure or loosening. Possible small knee joint effusion. Mild chronic enthesopathic change at the quadriceps insertion on the patella. IMPRESSION: 1. Acute right femoral intertrochanteric fracture with mild superior displacement of the distal fracture component. 2. Status post total right knee arthroplasty without evidence of hardware failure. Electronically Signed   By: Neita Garnet M.D.   On: 04/26/2022 10:57    Scheduled Meds:  celecoxib  200 mg Oral Daily   divalproex  250 mg Oral BID   donepezil  5 mg Oral QHS   enoxaparin (LOVENOX) injection  40 mg Subcutaneous Q24H   latanoprost  1 drop Both Eyes QHS   multivitamin  1 tablet Oral Daily   PARoxetine  10 mg Oral Daily   senna-docusate  1 tablet Oral QHS   Continuous Infusions:  sodium chloride 75 mL/hr at 04/26/22 2156     LOS: 1 day   Burnadette Pop, MD [facility] P10/12/2021, 8:18 AM

## 2022-04-27 NOTE — Anesthesia Postprocedure Evaluation (Signed)
Anesthesia Post Note  Patient: Cassidy [name]  Procedure(s) Performed: INTRAMEDULLARY NAILING OF RIGHT FEMURE (Right: Thigh)     Patient location during evaluation: PACU Anesthesia Type: General Level of consciousness: awake and alert, patient uncooperative and confused Pain management: pain level controlled Vital Signs Assessment: post-procedure vital signs reviewed and stable Respiratory status: spontaneous breathing, nonlabored ventilation and respiratory function stable Cardiovascular status: blood pressure returned to baseline and stable Postop Assessment: no apparent nausea or vomiting Anesthetic complications: no   No notable events documented.  Last Vitals:  Vitals:   04/27/22 1245 04/27/22 1300  BP: (!) 86/73 (!) 131/42  Pulse: 79 79  Resp: 12 12  Temp:    SpO2: 100% 100%    Last Pain:  Vitals:   04/27/22 1230  TempSrc:   PainSc: 0-No pain                 Djuan Talton,E. Lavel Rieman

## 2022-04-27 NOTE — ED Notes (Addendum)
Patient incontinent of bowels.  Patient provided with peri care and clean linens. Patient's HOB placed at 30 degrees.

## 2022-04-27 NOTE — Interval H&P Note (Signed)
History and Physical Interval Note:  04/27/2022 9:32 AM  Cassidy [name]  has presented today for surgery, with the diagnosis of RIGHT INTERTROCHANTERIC FRACTURE.  The various methods of treatment have been discussed with the patient and family. After consideration of risks, benefits and other options for treatment, the patient has consented to  Procedure(s): INTRAMEDULLARY (IM) NAIL RIGHT INTERTROCHANTERIC (Right) as a surgical intervention.  The patient's history has been reviewed, patient examined, no change in status, stable for surgery.  I have reviewed the patient's chart and labs.  Questions were answered to the patient's satisfaction.     Lennette Bihari P Dominique Ressel

## 2022-04-27 NOTE — Progress Notes (Signed)
Telephone consent for surgery with Dr. Doreatha Martin received from the legal guardian Rykeil Turner [phone].

## 2022-04-27 NOTE — Transfer of Care (Signed)
Immediate Anesthesia Transfer of Care Note  Patient: Naida Sleight  Procedure(s) Performed: INTRAMEDULLARY NAILING OF RIGHT FEMURE (Right: Thigh)  Patient Location: PACU  Anesthesia Type:General  Level of Consciousness: drowsy and patient cooperative  Airway & Oxygen Therapy: Patient Spontanous Breathing and Patient connected to nasal cannula oxygen  Post-op Assessment: Report given to RN, Post -op Vital signs reviewed and stable, and Patient moving all extremities X 4  Post vital signs: Reviewed and stable  Last Vitals:  Vitals Value Taken Time  BP 115/95 04/27/22 1153  Temp    Pulse 75 04/27/22 1155  Resp 13 04/27/22 1155  SpO2 100 % 04/27/22 1155  Vitals shown include unvalidated device data.  Last Pain:  Vitals:   04/27/22 0825  TempSrc: Oral         Complications: No notable events documented.

## 2022-04-27 NOTE — Anesthesia Procedure Notes (Signed)
Procedure Name: Intubation Date/Time: 04/27/2022 10:37 AM  Performed by: Gaylene Brooks, CRNAPre-anesthesia Checklist: Patient identified, Emergency Drugs available, Suction available and Patient being monitored Patient Re-evaluated:Patient Re-evaluated prior to induction Oxygen Delivery Method: Circle System Utilized Preoxygenation: Pre-oxygenation with 100% oxygen Induction Type: IV induction Ventilation: Mask ventilation without difficulty Laryngoscope Size: Miller and 2 Grade View: Grade I Tube type: Oral Tube size: 7.0 mm Number of attempts: 1 Airway Equipment and Method: Stylet Placement Confirmation: ETT inserted through vocal cords under direct vision, positive ETCO2 and breath sounds checked- equal and bilateral Secured at: 21 cm Tube secured with: Tape Dental Injury: Teeth and Oropharynx as per pre-operative assessment

## 2022-04-28 ENCOUNTER — Inpatient Hospital Stay (HOSPITAL_COMMUNITY): Payer: Medicare Other

## 2022-04-28 DIAGNOSIS — S72141A Displaced intertrochanteric fracture of right femur, initial encounter for closed fracture: Secondary | ICD-10-CM | POA: Diagnosis not present

## 2022-04-28 LAB — CBC
HCT: 21.4 % — ABNORMAL LOW (ref 36.0–46.0)
Hemoglobin: 7.4 g/dL — ABNORMAL LOW (ref 12.0–15.0)
MCH: 34.9 pg — ABNORMAL HIGH (ref 26.0–34.0)
MCHC: 34.6 g/dL (ref 30.0–36.0)
MCV: 100.9 fL — ABNORMAL HIGH (ref 80.0–100.0)
Platelets: 252 10*3/uL (ref 150–400)
RBC: 2.12 MIL/uL — ABNORMAL LOW (ref 3.87–5.11)
RDW: 13.2 % (ref 11.5–15.5)
WBC: 15.2 10*3/uL — ABNORMAL HIGH (ref 4.0–10.5)
nRBC: 0 % (ref 0.0–0.2)

## 2022-04-28 LAB — BASIC METABOLIC PANEL
Anion gap: 11 (ref 5–15)
BUN: 31 mg/dL — ABNORMAL HIGH (ref 8–23)
CO2: 23 mmol/L (ref 22–32)
Calcium: 8.5 mg/dL — ABNORMAL LOW (ref 8.9–10.3)
Chloride: 103 mmol/L (ref 98–111)
Creatinine, Ser: 1.18 mg/dL — ABNORMAL HIGH (ref 0.44–1.00)
GFR, Estimated: 45 mL/min — ABNORMAL LOW (ref >60)
Glucose, Bld: 131 mg/dL — ABNORMAL HIGH (ref 70–99)
Potassium: 4.4 mmol/L (ref 3.5–5.1)
Sodium: 137 mmol/L (ref 135–145)

## 2022-04-28 LAB — URINALYSIS, ROUTINE W REFLEX MICROSCOPIC
Bilirubin Urine: NEGATIVE
Glucose, UA: NEGATIVE mg/dL
Hgb urine dipstick: NEGATIVE
Ketones, ur: 5 mg/dL — AB
Nitrite: NEGATIVE
Protein, ur: NEGATIVE mg/dL
Specific Gravity, Urine: 1.021 (ref 1.005–1.030)
WBC, UA: >50 WBC/hpf — ABNORMAL HIGH (ref 0–5)
pH: 5 (ref 5.0–8.0)

## 2022-04-28 LAB — IRON AND TIBC
Iron: 29 ug/dL (ref 28–170)
Saturation Ratios: 11 % (ref 10.4–31.8)
TIBC: 255 ug/dL (ref 250–450)
UIBC: 226 ug/dL

## 2022-04-28 MED ORDER — SODIUM CHLORIDE 0.9 % IV BOLUS
250.0000 mL | Freq: Once | INTRAVENOUS | Status: AC
  Administered 2022-04-28: 250 mL via INTRAVENOUS

## 2022-04-28 MED ORDER — FENTANYL CITRATE PF 50 MCG/ML IJ SOSY: 12.5000 ug | PREFILLED_SYRINGE | Freq: Once | INTRAMUSCULAR | Status: DC

## 2022-04-28 MED ORDER — SODIUM CHLORIDE 0.9 % IV SOLN: 1.0000 g | INTRAVENOUS | Status: DC

## 2022-04-28 NOTE — Evaluation (Signed)
Occupational Therapy Evaluation Patient Details Name: Cassidy [name] MRN: [id number] DOB: 1933-11-25 Today's Date: 04/28/2022   History of Present Illness 88 y/o female presented to ED on 04/26/22 following fall. Sustained R femoral intertrochanteric fx. S/p cephalomedullary nail of R femur fx on 10/6. PMH: HTN, COPD, dementia   Clinical Impression   Patient admitted with the diagnosis above.  Limited session due to patient pain and actively resisting.  Not safe to try much, will need SNF level rehab trial to see if she can regain mobility.  Cognition is also a deficit.  OT can trial in the acute setting.        Recommendations for follow up therapy are one component of a multi-disciplinary discharge planning process, led by the attending physician.  Recommendations may be updated based on patient status, additional functional criteria and insurance authorization.   Follow Up Recommendations  Skilled nursing-short term rehab (<3 hours/day)    Assistance Recommended at Discharge Frequent or constant Supervision/Assistance  Patient can return home with the following A lot of help with bathing/dressing/bathroom;A lot of help with walking and/or transfers    Functional Status Assessment  Patient has had a recent decline in their functional status and demonstrates the ability to make significant improvements in function in a reasonable and predictable amount of time.  Equipment Recommendations  Wheelchair (measurements OT);Wheelchair cushion (measurements OT)    Recommendations for Other Services       Precautions / Restrictions Precautions Precautions: Fall Restrictions Weight Bearing Restrictions: Yes RLE Weight Bearing: Weight bearing as tolerated      Mobility Bed Mobility Overal bed mobility: Needs Assistance Bed Mobility: Rolling Rolling: Total assist, +2 for physical assistance, +2 for safety/equipment              Transfers                   General  transfer comment: deferred      Balance                                           ADL either performed or assessed with clinical judgement   ADL Overall ADL's : Needs assistance/impaired     Grooming: Wash/dry hands;Wash/dry face;Set up;Bed level   Upper Body Bathing: Moderate assistance;Bed level   Lower Body Bathing: Total assistance;Bed level   Upper Body Dressing : Moderate assistance;Bed level   Lower Body Dressing: Total assistance;Bed level       Toileting- Clothing Manipulation and Hygiene: Total assistance;Bed level               Vision   Vision Assessment?: No apparent visual deficits     Perception Perception Perception: Not tested   Praxis Praxis Praxis: Not tested    Pertinent Vitals/Pain Pain Assessment Pain Assessment: Faces Faces Pain Scale: Hurts whole lot Pain Location: R LE with touching or movement Pain Descriptors / Indicators: Grimacing, Guarding, Crying, Moaning Pain Intervention(s): Monitored during session     Hand Dominance Right   Extremity/Trunk Assessment Upper Extremity Assessment Upper Extremity Assessment: Overall WFL for tasks assessed   Lower Extremity Assessment Lower Extremity Assessment: Defer to PT evaluation RLE Deficits / Details: difficult to assess due to cognition and pain RLE: Unable to fully assess due to pain   Cervical / Trunk Assessment Cervical / Trunk Assessment: Normal   Communication Communication Communication: No difficulties  Cognition Arousal/Alertness: Awake/alert Behavior During Therapy: Restless, Agitated, Anxious Overall Cognitive Status: History of cognitive impairments - at baseline                                       General Comments   VSS on RA    Exercises     Shoulder Instructions      Home Living Family/patient expects to be discharged to:: Skilled nursing facility                                        Prior  Functioning/Environment Prior Level of Function : Patient poor historian/Family not available;Needs assist             Mobility Comments: seems mobile based on suspected cause of fall. Unable to determine PLOF as patient is poor historian ADLs Comments: would guess cues for thoroughness and sequencing from facility staff.        OT Problem List: Decreased range of motion;Impaired balance (sitting and/or standing);Decreased safety awareness;Pain      OT Treatment/Interventions: Self-care/ADL training;Balance training;Therapeutic activities;DME and/or AE instruction    OT Goals(Current goals can be found in the care plan section) Acute Rehab OT Goals Patient Stated Goal: none stated OT Goal Formulation: With patient Time For Goal Achievement: 05/11/22 Potential to Achieve Goals: Good ADL Goals Pt Will Perform Grooming: sitting;with min guard assist Pt Will Perform Upper Body Bathing: with min guard assist;sitting Pt Will Perform Upper Body Dressing: with min guard assist;sitting  OT Frequency: Min 2X/week    Co-evaluation PT/OT/SLP Co-Evaluation/Treatment: Yes Reason for Co-Treatment: Complexity of the patient's impairments (multi-system involvement)   OT goals addressed during session: ADL's and self-care      AM-PAC OT "6 Clicks" Daily Activity     Outcome Measure Help from another person eating meals?: A Little Help from another person taking care of personal grooming?: A Little Help from another person toileting, which includes using toliet, bedpan, or urinal?: Total Help from another person bathing (including washing, rinsing, drying)?: A Lot Help from another person to put on and taking off regular upper body clothing?: A Lot Help from another person to put on and taking off regular lower body clothing?: Total 6 Click Score: 12   End of Session Nurse Communication: Mobility status  Activity Tolerance: Patient limited by pain Patient left: in bed;with call  bell/phone within reach;with bed alarm set  OT Visit Diagnosis: Pain Pain - Right/Left: Right Pain - part of body: Leg                Time: 0950-1008 OT Time Calculation (min): 18 min Charges:  OT General Charges $OT Visit: 1 Visit OT Evaluation $OT Eval Moderate Complexity: 1 Mod  04/28/2022  RP, OTR/L  Acute Rehabilitation Services  Office:  [phone]   Cassidy [name] 04/28/2022, 10:53 AM

## 2022-04-28 NOTE — Progress Notes (Addendum)
Patients diastolic BP and MAP low, pt alert to self and able to follow some commands. No urine output on shift. Bladder scan showing 160mL. No bleeding from surgical site. NP notified. Bolus ordered.   Patient able to urinate.   4360: MD messaged to hold antihypertensives if any.

## 2022-04-28 NOTE — Evaluation (Signed)
Physical Therapy Evaluation Patient Details Name: Cassidy [name] MRN: [id number] DOB: 13-Dec-1933 Today's Date: 04/28/2022  History of Present Illness  88 y/o female presented to ED on 04/26/22 following fall. Sustained R femoral intertrochanteric fx. S/p cephalomedullary nail of R femur fx on 10/6. PMH: HTN, COPD, dementia  Clinical Impression  Patient admitted with the above. Per chart, patient from facility but unsure of PLOF as patient is poor historian due to hx of dementia and oriented to self only. Patient with increased pain and resistive to all touch or movement. Required totalA+2 to attempt sitting EOB but patient screaming out in pain at any attempt to touch R LE. Patient will benefit from skilled PT services during acute stay to address listed deficits. Recommend SNF at discharge to maximize functional mobility and safety.         Recommendations for follow up therapy are one component of a multi-disciplinary discharge planning process, led by the attending physician.  Recommendations may be updated based on patient status, additional functional criteria and insurance authorization.  Follow Up Recommendations Skilled nursing-short term rehab (<3 hours/day) Can patient physically be transported by private vehicle: No    Assistance Recommended at Discharge Frequent or constant Supervision/Assistance  Patient can return home with the following  Two people to help with walking and/or transfers;Two people to help with bathing/dressing/bathroom    Equipment Recommendations Other (comment) (defer to next venue of care)  Recommendations for Other Services       Functional Status Assessment Patient has had a recent decline in their functional status and/or demonstrates limited ability to make significant improvements in function in a reasonable and predictable amount of time     Precautions / Restrictions Precautions Precautions: Fall Restrictions Weight Bearing Restrictions: Yes RLE  Weight Bearing: Weight bearing as tolerated      Mobility  Bed Mobility Overal bed mobility: Needs Assistance Bed Mobility: Rolling Rolling: Total assist, +2 for physical assistance, +2 for safety/equipment         General bed mobility comments: patient required totalA+2 for any attempts at bed mobility and refusing any mobility due to severe pain with touching and movement of R LE.    Transfers                   General transfer comment: deferred    Ambulation/Gait                  Stairs            Wheelchair Mobility    Modified Rankin (Stroke Patients Only)       Balance                                             Pertinent Vitals/Pain Pain Assessment Pain Assessment: Faces Faces Pain Scale: Hurts whole lot Pain Location: R LE with touching or movement Pain Descriptors / Indicators: Grimacing, Guarding, Crying, Moaning Pain Intervention(s): Monitored during session    Home Living Family/patient expects to be discharged to:: Skilled nursing facility                        Prior Function Prior Level of Function : Patient poor historian/Family not available;Needs assist             Mobility Comments: seems mobile based on suspected cause of fall. Unable to  determine PLOF as patient is poor historian       Hand Dominance        Extremity/Trunk Assessment   Upper Extremity Assessment Upper Extremity Assessment: Defer to OT evaluation    Lower Extremity Assessment Lower Extremity Assessment: RLE deficits/detail;Generalized weakness RLE Deficits / Details: difficult to assess due to cognition and pain RLE: Unable to fully assess due to pain       Communication   Communication: No difficulties  Cognition Arousal/Alertness: Awake/alert Behavior During Therapy: WFL for tasks assessed/performed Overall Cognitive Status: History of cognitive impairments - at baseline                                  General Comments: hx of dementia        General Comments      Exercises     Assessment/Plan    PT Assessment Patient needs continued PT services  PT Problem List Decreased strength;Decreased activity tolerance;Decreased balance;Decreased range of motion;Decreased mobility;Decreased cognition;Decreased knowledge of use of DME;Decreased safety awareness;Decreased knowledge of precautions;Pain       PT Treatment Interventions DME instruction;Gait training;Functional mobility training;Therapeutic activities;Therapeutic exercise;Balance training;Patient/family education    PT Goals (Current goals can be found in the Care Plan section)  Acute Rehab PT Goals Patient Stated Goal: did not state PT Goal Formulation: Patient unable to participate in goal setting Time For Goal Achievement: 05/12/22 Potential to Achieve Goals: Poor    Frequency Min 2X/week     Co-evaluation               AM-PAC PT "6 Clicks" Mobility  Outcome Measure Help needed turning from your back to your side while in a flat bed without using bedrails?: Total Help needed moving from lying on your back to sitting on the side of a flat bed without using bedrails?: Total Help needed moving to and from a bed to a chair (including a wheelchair)?: Total Help needed standing up from a chair using your arms (e.g., wheelchair or bedside chair)?: Total Help needed to walk in hospital room?: Total Help needed climbing 3-5 steps with a railing? : Total 6 Click Score: 6    End of Session   Activity Tolerance: Patient limited by pain Patient left: in bed;with call bell/phone within reach;with bed alarm set Nurse Communication: Mobility status PT Visit Diagnosis: Muscle weakness (generalized) (M62.81);History of falling (Z91.81);Difficulty in walking, not elsewhere classified (R26.2)    Time: 0354-6568 PT Time Calculation (min) (ACUTE ONLY): 16 min   Charges:   PT Evaluation $PT Eval Moderate  Complexity: 1 Mod          Ivory Bail A. Dan Humphreys PT, DPT Acute Rehabilitation Services Office [phone]   Viviann Spare 04/28/2022, 10:42 AM

## 2022-04-28 NOTE — Progress Notes (Signed)
PROGRESS NOTE  Cassidy [name]  [id number] DOB: [birth year] DOA: 04/26/2022 PCP: Patient, No Pcp Per   Brief Narrative:  Patient is a 88 year old female with history of hypertension, COPD, osteoarthritis, dementia who presented with right hip pain from [place] facility after a fall.  Patient was confused ,unable to obtain significant history.  She is under legal guardianship of [facility].  On presentation, she was hemodynamically stable.  Lab work showed WBC of 13.7, creatinine of 1.1.  X-ray showed right femoral intertrochanteric fracture with mild superior displacement of the distal fracture component, right knee arthroplasty with intact hardware.  CT head did not show any acute findings.  Orthopedics consulted.  Underwent nailing of right intertrochanteric femur fracture on 10/6.  PT/OT recommending SNF on discharge  Assessment & Plan:  Principal Problem:   Closed intertrochanteric fracture of right hip [facility]) Active Problems:   Fall   Leukocytosis   Right bundle branch block   Dementia with behavioral disturbance (HCC)   Renal insufficiency   Closed right hip fracture (HCC)   Right intertrochanteric fracture secondary to fall: Developed right hip pain after she fell.  X-rays revealed an acute right femoral intertrochanteric fracture with mild superior displacement of the distal fracture component.  Underwent nailing of right intertrochanteric femur fracture on 10/6.  PT/OT recommending SNF on discharge  Continue pain management, supportive care.  DVT prophylaxis with lovenox, PT/OT evaluation ,recommending SNF on discharge Continue bowel regimen  Leukocytosis: Likely reactive.  Continue to monitor.  No clear signs of infection.  But will check chest x-ray and UA.  AKI versus CKD stage II: Creatinine of 1.1.  Continue to monitor.  Gentle IV fluids   Acute blood loss normocytic anemia: Hemoglobin dropped to the range of 7 from 11.  Likely associated with  surgery.  Checking iron panel.  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  Last echo showed EF of 55 to [id number], grade 1 diastolic dysfunction.  Dementia with behavioral disturbance: Has dementia at baseline.  Continue Aricept, Paxil, Depakote          DVT prophylaxis:enoxaparin (LOVENOX) injection 40 mg Start: 04/28/22 1000 SCDs Start: 04/27/22 1610     Code Status: Full Code  Family Communication: None at bedside  Patient status:Inpatient  Patient is from :SNF  Anticipated discharge to:SNF  Estimated DC date: 1 to 2 days   Consultants: Ortho  Procedures:ORIF  Antimicrobials:  Anti-infectives (From admission, onward)    Start     Dose/Rate Route Frequency Ordered Stop   04/27/22 1700  ceFAZolin (ANCEF) IVPB 2g/100 mL premix        2 g 200 mL/hr over 30 Minutes Intravenous Every 8 hours 04/27/22 1609 04/28/22 1054   04/27/22 0900  ceFAZolin (ANCEF) IVPB 2g/100 mL premix  Status:  Discontinued        2 g 200 mL/hr over 30 Minutes Intravenous On call to O.R. 04/27/22 [id number] 04/27/22 1558       Subjective: Patient seen and examined at bedside today.  Hemodynamically stable.  Appears comfortable, alert and awake, lying in bed but confused.  Not on significant pain  Objective: Vitals:   04/28/22 0237 04/28/22 0304 04/28/22 0605 04/28/22 0817  BP: (!) 112/44 100/74 (!) 115/52 (!) 102/49  Pulse:    84  Resp:    13  Temp:      TempSrc:  SpO2:      Weight:      Height:        Intake/Output Summary (Last 24 hours) at 04/28/2022 1119 Last data filed at 04/28/2022 0815 Gross per 24 hour  Intake 1200 ml  Output 700 ml  Net 500 ml   Filed Weights   04/27/22 0825  Weight: 60 kg    Examination:   General exam: Overall comfortable, not in distress, confused HEENT: PERRL Respiratory system:  no wheezes or crackles  Cardiovascular system: S1 & S2  heard, RRR.  Gastrointestinal system: Abdomen is nondistended, soft and nontender. Central nervous system: Alert and weak but confused Extremities: No edema, no clubbing ,no cyanosis,surgical wound  on the right hip Skin: No rashes, no ulcers,no icterus     Data Reviewed: I have personally reviewed following labs and imaging studies  CBC: Recent Labs  Lab 04/26/22 1009 04/27/22 1647 04/28/22 0221  WBC 13.7* 19.1* 15.2*  NEUTROABS 9.2*  --   --   HGB 11.9* 8.2* 7.4*  HCT 35.4* 24.2* 21.4*  MCV 97.0 98.4 100.9*  PLT 378 299 751   Basic Metabolic Panel: Recent Labs  Lab 04/26/22 1009 04/28/22 0221  NA 136 137  K 4.4 4.4  CL 102 103  CO2 21* 23  GLUCOSE 122* 131*  BUN 25* 31*  CREATININE 1.12* 1.18*  CALCIUM 9.5 8.5*     Recent Results (from the past 240 hour(s))  Surgical pcr screen     Status: None   Collection Time: 04/27/22  8:55 AM   Specimen: Nasal Mucosa; Nasal Swab  Result Value Ref Range Status   MRSA, PCR NEGATIVE NEGATIVE Final   Staphylococcus aureus NEGATIVE NEGATIVE Final    Comment: (NOTE) The Xpert SA Assay (FDA approved for NASAL specimens in patients 35 years of age and older), is one component of a comprehensive surveillance program. It is not intended to diagnose infection nor to guide or monitor treatment. Performed at [facility], [place] [address], [place], [place] [address]      Radiology Studies: DG HIP PORT [place] OR W/O PELVIS 1V RIGHT  Result Date: 04/28/2022 CLINICAL DATA:  ORIF RIGHT femur fracture. EXAM: DG HIP (WITH OR WITHOUT PELVIS) 3V PORT RIGHT COMPARISON:  Prior studies FINDINGS: Intramedullary rod and screws traversing an intertrochanteric RIGHT femur fracture are noted. 5 mm offset at the primary fracture line noted. Displaced lesser trochanter fragment again noted. IMPRESSION: ORIF intertrochanteric RIGHT femur fracture. Electronically Signed   By: Margarette Canada M.D.   On: 04/28/2022 09:28   DG HIP UNILAT WITH PELVIS  1V RIGHT  Result Date: 04/27/2022 CLINICAL DATA:  Imaging for right femur fracture ORIF. EXAM: OPERATIVE RIGHT HIP (WITH PELVIS IF PERFORMED) 7 VIEWS TECHNIQUE: Fluoroscopic spot image(s) were submitted for interpretation post-operatively. Fluoro time: 1 minute and 48 seconds. Dose: 16.56 mGy. COMPARISON:  04/26/2022 FINDINGS: Submitted images show placement of a short intramedullary rod supporting 2 screws, reducing the primary intertrochanteric fracture components. Fracture components are well aligned. Orthopedic hardware is well seated. IMPRESSION: Fluoroscopy provided for right proximal femur fracture ORIF. Electronically Signed   By: Lajean Manes M.D.   On: 04/27/2022 11:37   DG C-Arm 1-60 Min-No Report  Result Date: 04/27/2022 Fluoroscopy was utilized by the requesting physician.  No radiographic interpretation.   DG Chest 1 View  Result Date: 04/26/2022 CLINICAL DATA:  Status post fall. EXAM: CHEST  1 VIEW COMPARISON:  Chest radiograph April 01, 2018. FINDINGS: Normal cardiac and mediastinal  contours. Aortic atherosclerosis. Low lung volume exam. Basilar heterogeneous opacities. No pleural effusion or pneumothorax. IMPRESSION: Low lung volumes with basilar atelectasis. Electronically Signed   By: Lovey Newcomer M.D.   On: 04/26/2022 18:50   CT HEAD WO CONTRAST  Result Date: 04/26/2022 CLINICAL DATA:  Memory loss, fall. EXAM: CT HEAD WITHOUT CONTRAST TECHNIQUE: Contiguous axial images were obtained from the base of the skull through the vertex without intravenous contrast. RADIATION DOSE REDUCTION: This exam was performed according to the departmental dose-optimization program which includes automated exposure control, adjustment of the mA and/or kV according to patient size and/or use of iterative reconstruction technique. COMPARISON:  March 28, 2018. FINDINGS: Brain: No evidence of acute infarction, hemorrhage, hydrocephalus, extra-axial collection or mass lesion/mass effect. Vascular: No  hyperdense vessel or unexpected calcification. Skull: Normal. Negative for fracture or focal lesion. Sinuses/Orbits: No acute finding. Other: None. IMPRESSION: No acute intracranial abnormality seen. Electronically Signed   By: Marijo Conception M.D.   On: 04/26/2022 11:26    Scheduled Meds:  celecoxib  200 mg Oral Daily   divalproex  250 mg Oral BID   docusate sodium  100 mg Oral BID   donepezil  5 mg Oral QHS   enoxaparin (LOVENOX) injection  40 mg Subcutaneous Q24H   fentaNYL (SUBLIMAZE) injection  12.5 mcg Intravenous Once   latanoprost  1 drop Both Eyes QHS   multivitamin  1 tablet Oral Daily   PARoxetine  10 mg Oral Daily   senna-docusate  1 tablet Oral QHS   Continuous Infusions:  sodium chloride 75 mL/hr at 04/28/22 0206     LOS: 2 days   Shelly Coss, MD [facility] P10/01/2022, 11:19 AM

## 2022-04-29 DIAGNOSIS — S72141A Displaced intertrochanteric fracture of right femur, initial encounter for closed fracture: Secondary | ICD-10-CM | POA: Diagnosis not present

## 2022-04-29 LAB — BASIC METABOLIC PANEL
Anion gap: 5 (ref 5–15)
BUN: 21 mg/dL (ref 8–23)
CO2: 27 mmol/L (ref 22–32)
Calcium: 8.3 mg/dL — ABNORMAL LOW (ref 8.9–10.3)
Chloride: 108 mmol/L (ref 98–111)
Creatinine, Ser: 0.73 mg/dL (ref 0.44–1.00)
GFR, Estimated: >60 mL/min (ref >60)
Glucose, Bld: 108 mg/dL — ABNORMAL HIGH (ref 70–99)
Potassium: 4 mmol/L (ref 3.5–5.1)
Sodium: 140 mmol/L (ref 135–145)

## 2022-04-29 LAB — CBC
HCT: 19.8 % — ABNORMAL LOW (ref 36.0–46.0)
Hemoglobin: 6.7 g/dL — CL (ref 12.0–15.0)
MCH: 33.2 pg (ref 26.0–34.0)
MCHC: 33.8 g/dL (ref 30.0–36.0)
MCV: 98 fL (ref 80.0–100.0)
Platelets: 210 10*3/uL (ref 150–400)
RBC: 2.02 MIL/uL — ABNORMAL LOW (ref 3.87–5.11)
RDW: 13.1 % (ref 11.5–15.5)
WBC: 11.2 10*3/uL — ABNORMAL HIGH (ref 4.0–10.5)
nRBC: 0 % (ref 0.0–0.2)

## 2022-04-29 LAB — PREPARE RBC (CROSSMATCH)

## 2022-04-29 LAB — URINE CULTURE: Culture: NO GROWTH

## 2022-04-29 MED ORDER — ENOXAPARIN SODIUM 40 MG/0.4ML IJ SOSY
40.0000 mg | PREFILLED_SYRINGE | INTRAMUSCULAR | Status: DC
  Administered 2022-04-29: 40 mg via SUBCUTANEOUS
  Filled 2022-04-29 (×2): qty 0.4

## 2022-04-29 MED ORDER — POLYETHYLENE GLYCOL 3350 17 G PO PACK
17.0000 g | PACK | Freq: Every day | ORAL | Status: DC
  Administered 2022-04-29 – 2022-05-03 (×5): 17 g via ORAL
  Filled 2022-04-29 (×4): qty 1

## 2022-04-29 MED ORDER — SENNOSIDES-DOCUSATE SODIUM 8.6-50 MG PO TABS
1.0000 | ORAL_TABLET | Freq: Two times a day (BID) | ORAL | Status: DC
  Administered 2022-04-29 – 2022-05-03 (×6): 1 via ORAL
  Filled 2022-04-29 (×6): qty 1

## 2022-04-29 MED ORDER — ENOXAPARIN SODIUM 30 MG/0.3ML IJ SOSY: 30.0000 mg | PREFILLED_SYRINGE | INTRAMUSCULAR | Status: DC

## 2022-04-29 MED ORDER — SODIUM CHLORIDE 0.9% IV SOLUTION: Freq: Once | INTRAVENOUS | Status: DC

## 2022-04-29 NOTE — Social Work (Signed)
Pt is from [facility] facility and was admitted after a fall. Pt has dementia and a legal guardian, CSW attempted to contact legal guardian but she will not be available until Monday. CSW left LG a VM to follow up about DP.

## 2022-04-29 NOTE — Progress Notes (Cosign Needed Addendum)
Orthopaedic Trauma Service Progress Note  Patient ID: Cassidy [name] MRN: [id number] DOB/AGE: February 24, 1934 88 y.o.  Subjective:  No acute issues Not talking to me today  Nursing trying to get in contact with guardian to get consent for PRBCs   Review of Systems  Unable to perform ROS: Dementia    Objective:   VITALS:   Vitals:   04/29/22 0500 04/29/22 0657 04/29/22 0815 04/29/22 0831  BP: 127/71 (!) 115/49 (!) 122/41   Pulse: 69  76   Resp: 16  15 16   Temp:   97.8 F (36.6 C)   TempSrc:   Axillary   SpO2: 98%     Weight:      Height:        Estimated body mass index is 22.71 kg/m as calculated from the following:   Height as of this encounter: 5\' 4"  (1.626 m).   Weight as of this encounter: 60 kg.   Intake/Output      10/07 0701 10/08 0700 10/08 0701 10/09 0700   I.V. (mL/kg) 2000 (33.3)    IV Piggyback 0    Total Intake(mL/kg) 2000 (33.3)    Urine (mL/kg/hr) 800 (0.6) 600 (1.4)   Stool 0    Blood     Total Output 800 600   Net +1200 -600        Stool Occurrence 1 x      LABS  Results for orders placed or performed during the hospital encounter of 04/26/22 (from the past 24 hour(s))  Urinalysis, Routine w reflex microscopic     Status: Abnormal   Collection Time: 04/28/22  2:34 PM  Result Value Ref Range   Color, Urine YELLOW YELLOW   APPearance HAZY (A) CLEAR   Specific Gravity, Urine 1.021 1.005 - 1.030   pH 5.0 5.0 - 8.0   Glucose, UA NEGATIVE NEGATIVE mg/dL   Hgb urine dipstick NEGATIVE NEGATIVE   Bilirubin Urine NEGATIVE NEGATIVE   Ketones, ur 5 (A) NEGATIVE mg/dL   Protein, ur NEGATIVE NEGATIVE mg/dL   Nitrite NEGATIVE NEGATIVE   Leukocytes,Ua MODERATE (A) NEGATIVE   RBC / HPF 0-5 0 - 5 RBC/hpf   WBC, UA >50 (H) 0 - 5 WBC/hpf   Bacteria, UA RARE (A) NONE SEEN   WBC Clumps PRESENT    Mucus PRESENT   Urine Culture     Status: None   Collection Time: 04/28/22  3:22  PM   Specimen: Urine, Clean Catch  Result Value Ref Range   Specimen Description URINE, CLEAN CATCH    Special Requests NONE    Culture      NO GROWTH Performed at [facility], [address], [place], [address] [place]    Report Status 04/29/2022 FINAL   CBC     Status: Abnormal   Collection Time: 04/29/22  3:22 AM  Result Value Ref Range   WBC 11.2 (H) 4.0 - 10.5 K/uL   RBC 2.02 (L) 3.87 - 5.11 MIL/uL   Hemoglobin 6.7 (LL) 12.0 - 15.0 g/dL   HCT 06/29/2022 (L) 06/29/22 - 71.2 %   MCV 98.0 80.0 - 100.0 fL   MCH 33.2 26.0 - 34.0 pg   MCHC 33.8 30.0 - 36.0 g/dL   RDW 19.7 58.8 - 32.5 %   Platelets 210 150 - 400  K/uL   nRBC 0.0 0.0 - 0.2 %  Basic metabolic panel     Status: Abnormal   Collection Time: 04/29/22  3:22 AM  Result Value Ref Range   Sodium 140 135 - 145 mmol/L   Potassium 4.0 3.5 - 5.1 mmol/L   Chloride 108 98 - 111 mmol/L   CO2 27 22 - 32 mmol/L   Glucose, Bld 108 (H) 70 - 99 mg/dL   BUN 21 8 - 23 mg/dL   Creatinine, Ser 0.73 0.44 - 1.00 mg/dL   Calcium 8.3 (L) 8.9 - 10.3 mg/dL   GFR, Estimated >60 >60 mL/min   Anion gap 5 5 - 15  Prepare RBC (crossmatch)     Status: None   Collection Time: 04/29/22  4:04 AM  Result Value Ref Range   Order Confirmation      ORDER PROCESSED BY BLOOD BANK Performed at [facility], [place] [address], [place], [place] [address]      PHYSICAL EXAM:   Gen: sitting up in bed, NAD, breakfast tray in front of pt and is untouched. She is awake but not talking. No significant pallor to skin  Lungs: unlabored Ext:       Right Lower Extremity   Dressing with scant strikethrough but overall intact and stable   Ext warm   + DP pulse  Minimal swelling   Not following commands to assess motor or sensory functions      Assessment/Plan: 2 Days Post-Op   Principal Problem:   Closed intertrochanteric fracture of right hip (HCC) Active Problems:   Leukocytosis   Dementia with behavioral disturbance (HCC)   Fall   Renal  insufficiency   Right bundle branch block   Closed right hip fracture (HCC)   Anti-infectives (From admission, onward)    Start     Dose/Rate Route Frequency Ordered Stop   04/28/22 1615  cefTRIAXone (ROCEPHIN) 1 g in sodium chloride 0.9 % 100 mL IVPB        1 g 200 mL/hr over 30 Minutes Intravenous Every 24 hours 04/28/22 1523     04/27/22 1700  ceFAZolin (ANCEF) IVPB 2g/100 mL premix        2 g 200 mL/hr over 30 Minutes Intravenous Every 8 hours 04/27/22 1609 04/28/22 1054   04/27/22 0900  ceFAZolin (ANCEF) IVPB 2g/100 mL premix  Status:  Discontinued        2 g 200 mL/hr over 30 Minutes Intravenous On call to O.R. 04/27/22 1914 04/27/22 1558     .  POD/HD#: 2  88 y/o female s/p fall with R intertrochanteric femur fracture    Weightbearing: WBAT RLE ROM: unrestricted ROM R hip and knee Insicional and dressing care: dressing changes as needed with mepilex Pain management: multimodal: tylenol, norco for severe pain  NW:GNFAOZ abx completed  Impediments to Fracture Healing: fragility fracture   FEN: order placed to assist patient with feeding    VTE prophylaxis: Lovenox as inpatient and asa 325 mg daily at dc   Dispo: Ortho issues stable  Ok to dc to snf once bed available  Follow up with Ortho in 10-14 days     Jari Pigg, PA-C [phone] (C) 04/29/2022, 2:13 PM  [facility] [place] [address] [phone] Jenetta [phone] (F)    After 5pm and on the weekends please log on to Amion, go to orthopaedics and the look under the [facility] Call for the provider(s) on call. You  can also call our office at [phone] and then follow the prompts to be connected to the call team.   Patient ID: Cassidy [name], female   DOB: [birth year], 88 y.o.   MRN: [id number]

## 2022-04-29 NOTE — Progress Notes (Signed)
MD questioned if blood transfusion has been completed.  This nurse explained to MD transfusion has not been completed.  Informed consent has not been given.  Unable to contact Horine legal guardian.  Have left two voicemails.  Per charge nurse and [facility] we cannot transfuse blood without consent.  Per charge nurse, Rodena Piety, two medical physicians will need to make a determination of consent for patient.  This information was given to MD.  He is aware.  MD states patient is not "life threatening" and will wait until tomorrow.

## 2022-04-29 NOTE — Progress Notes (Signed)
Attempted to call Legal Guardian, Hulen Shouts at [phone] to given patient status update and attempt to get authorization for blood transfusion.  Unable to speak with guardian.  Left message on voicemail.  Charge nurse and MD notified.

## 2022-04-29 NOTE — Progress Notes (Signed)
At bedside for PIV insertion. Stuck x 2. Pt with extreme anxiety and unable to keep arm still during cannulation. Stick unsuccessful.

## 2022-04-29 NOTE — NC FL2 (Signed)
[place] MEDICAID FL2 LEVEL OF CARE SCREENING TOOL     IDENTIFICATION  Patient Name: Cassidy [name] Birthdate: 10-Dec-1933 Sex: female Admission Date (Current Location): 04/26/2022  [facility] and [place] Number:  Producer, television/film/video and Address:  [facility], [address], [place], [place] [address]      Provider Number: [id number]  Attending Physician Name and Address:  Burnadette Pop, MD  Relative Name and Phone Number:  Barron Schmid (Legal Guardian)   [phone]    Current Level of Care: Hospital Recommended Level of Care: Skilled Nursing Facility Prior Approval Number:    Date Approved/Denied:   PASRR Number: [id number]  Discharge Plan: SNF    Current Diagnoses: Patient Active Problem List   Diagnosis Date Noted   Closed right hip fracture (HCC) 04/27/2022   Closed intertrochanteric fracture of right hip (HCC) 04/26/2022   Dementia with behavioral disturbance (HCC) 04/26/2022   Fall 04/26/2022   Renal insufficiency 04/26/2022   Right bundle branch block 04/26/2022   Orthostatic hypotension 06/11/2016   Intractable abdominal pain 06/06/2016   Diarrhea 06/06/2016   Leukocytosis 06/06/2016   Essential hypertension    Chest pain, rule out acute myocardial infarction 05/10/2016   Dizziness 05/10/2016   Bradycardia 05/10/2016   Subacute confusional state 05/10/2016   Adjustment disorder with other symptoms 05/10/2016    Orientation RESPIRATION BLADDER Height & Weight     Self  Normal Incontinent, External catheter Weight: 132 lb 4.4 oz (60 kg) Height:  5\' 4"  (162.6 cm)  BEHAVIORAL SYMPTOMS/MOOD NEUROLOGICAL BOWEL NUTRITION STATUS      Incontinent Diet (See DC Summary)  AMBULATORY STATUS COMMUNICATION OF NEEDS Skin   Extensive Assist Verbally Surgical wounds (Incision R Hip)                       Personal Care Assistance Level of Assistance  Bathing, Dressing, Feeding Bathing Assistance: Maximum assistance Feeding  assistance: Independent Dressing Assistance: Maximum assistance     Functional Limitations Info  Sight, Hearing, Speech Sight Info: Adequate Hearing Info: Adequate Speech Info: Adequate    SPECIAL CARE FACTORS FREQUENCY  PT (By licensed PT), OT (By licensed OT)     PT Frequency: 5x a week OT Frequency: 5x a week            Contractures Contractures Info: Not present    Additional Factors Info  Code Status, Allergies Code Status Info: Full Allergies Info: Ciprofloxacin Hcl   Prednisone   Zantac (Ranitidine)           Current Medications (04/29/2022):  This is the current hospital active medication list Current Facility-Administered Medications  Medication Dose Route Frequency Provider Last Rate Last Admin   0.9 %  sodium chloride infusion (Manually program via Guardrails IV Fluids)   Intravenous Once 06/29/2022, NP       0.9 %  sodium chloride infusion   Intravenous Continuous Anthoney Harada, PA-C 75 mL/hr at 04/28/22 0206 New Bag at 04/28/22 0206   cefTRIAXone (ROCEPHIN) 1 g in sodium chloride 0.9 % 100 mL IVPB  1 g Intravenous Q24H Adhikari, Amrit, MD       celecoxib (CELEBREX) capsule 200 mg  200 mg Oral Daily 06/28/22 A, PA-C   200 mg at 04/29/22 1008   divalproex (DEPAKOTE) DR tablet 250 mg  250 mg Oral BID 06/29/22 A, PA-C   250 mg at 04/29/22 1008   docusate sodium (COLACE) capsule 100 mg  100  mg Oral BID Corinne Ports, PA-C   100 mg at 04/29/22 1008   donepezil (ARICEPT) tablet 5 mg  5 mg Oral QHS Corinne Ports, PA-C   5 mg at 04/28/22 2207   enoxaparin (LOVENOX) injection 40 mg  40 mg Subcutaneous Q24H Shelly Coss, MD   40 mg at 04/29/22 1008   fentaNYL (SUBLIMAZE) injection 12.5 mcg  12.5 mcg Intravenous Once Raenette Rover, NP       fentaNYL (SUBLIMAZE) injection 25 mcg  25 mcg Intravenous Q2H PRN Corinne Ports, PA-C   25 mcg at 04/27/22 0318   HYDROcodone-acetaminophen (NORCO/VICODIN) 5-325 MG per tablet 1 tablet  1 tablet Oral  Q6H PRN Corinne Ports, PA-C   1 tablet at 04/28/22 0707   latanoprost (XALATAN) 0.005 % ophthalmic solution 1 drop  1 drop Both Eyes QHS Corinne Ports, PA-C   1 drop at 04/28/22 2207   loperamide (IMODIUM) capsule 2 mg  2 mg Oral PRN Corinne Ports, PA-C       menthol-cetylpyridinium (CEPACOL) lozenge 3 mg  1 lozenge Oral PRN Corinne Ports, PA-C       Or   phenol (CHLORASEPTIC) mouth spray 1 spray  1 spray Mouth/Throat PRN Corinne Ports, PA-C       metoCLOPramide (REGLAN) tablet 5-10 mg  5-10 mg Oral Q8H PRN Thereasa Solo, Sarah A, PA-C       Or   metoCLOPramide (REGLAN) injection 5-10 mg  5-10 mg Intravenous Q8H PRN McClung, Sarah A, PA-C       multivitamin (PROSIGHT) tablet 1 tablet  1 tablet Oral Daily Corinne Ports, PA-C   1 tablet at 04/29/22 1008   ondansetron (ZOFRAN) tablet 4 mg  4 mg Oral Q6H PRN Corinne Ports, PA-C       Or   ondansetron (ZOFRAN) injection 4 mg  4 mg Intravenous Q6H PRN McClung, Sarah A, PA-C       PARoxetine (PAXIL) tablet 10 mg  10 mg Oral Daily Rushie Nyhan A, PA-C   10 mg at 04/29/22 1008   senna-docusate (Senokot-S) tablet 1 tablet  1 tablet Oral QHS Corinne Ports, PA-C   1 tablet at 04/28/22 2207     Discharge Medications: Please see discharge summary for a list of discharge medications.  Relevant Imaging Results:  Relevant Lab Results:   Additional Information SSN [id number]  Reece Agar, [place]

## 2022-04-29 NOTE — Progress Notes (Signed)
Pt hemoglobin 6.7, MD notified and ordered 1 unit PRBC. Pt does not currently have a signed consent for blood products/transfusion. Pt is confused and under guardianship of [place] ([place] Pax [phone]). We are unable to reach them at this time. MD, CN and AC notified. Will continue to monitor for bleeding and monitor VS closely for now, and will continue attempts to contact guardian for consent.   Pt resting w/ eyes closed, call bell in reach, no signs of distress at this time, will monitor closely

## 2022-04-29 NOTE — Progress Notes (Signed)
Date and time results received: 04/29/22 0355  Reported by: Jenny Reichmann, LAB Reported to: Grayce Sessions, RN  Test: hemoglobin  Critical Value: 6.7  Name of Provider Notified: A. Zebedee Iba  Orders Received? Or Actions Taken?: MD notified, awaiting orders

## 2022-04-29 NOTE — Progress Notes (Signed)
The Patient was seen earlier this shift by the IV Team, who attempted twice to place an IV;  pt was uncooperative.  Pt was also assessed and attempted x 1 by this writer;  warm packs were used to dilate veins, and ultrasound was also used;  Pt became very agitated, yelling, despite her RN at the bedside helping to hold her arm.  No further attempts made; pt remains without iv access.

## 2022-04-29 NOTE — Progress Notes (Signed)
PROGRESS NOTE  Cassidy [name]  POE:[id number] DOB: 1933-09-30 DOA: 04/26/2022 PCP: Patient, No Pcp Per   Brief Narrative:  Patient is a 88 year old female with history of hypertension, COPD, osteoarthritis, dementia who presented with right hip pain from [facility] nursing facility after a fall.  Patient was confused ,unable to obtain significant history.  She is under legal guardianship of [facility].  On presentation, she was hemodynamically stable.  Lab work showed WBC of 13.7, creatinine of 1.1.  X-ray showed right femoral intertrochanteric fracture with mild superior displacement of the distal fracture component, right knee arthroplasty with intact hardware.  CT head did not show any acute findings.  Orthopedics consulted.  Underwent nailing of right intertrochanteric femur fracture on 10/6.  PT/OT recommending SNF on discharge  Assessment & Plan:  Principal Problem:   Closed intertrochanteric fracture of right hip [facility]) Active Problems:   Fall   Leukocytosis   Right bundle branch block   Dementia with behavioral disturbance (HCC)   Renal insufficiency   Closed right hip fracture (HCC)   Right intertrochanteric fracture secondary to fall: Developed right hip pain after she fell.  X-rays revealed an acute right femoral intertrochanteric fracture with mild superior displacement of the distal fracture component.  Underwent nailing of right intertrochanteric femur fracture on 10/6.  PT/OT recommending SNF on discharge  Continue pain management, supportive care.  DVT prophylaxis with lovenox, PT/OT evaluation ,recommending SNF on discharge Continue bowel regimen  Leukocytosis: Likely reactive.  Continue to monitor.  No clear signs of infection.  UA showed leukocytes, unclear if she had dysuria.  Started on ceftriaxone, urine culture sent.  Chest x-ray did not show pneumonia..  AKI: Resolved with IV fluids  Acute blood loss normocytic anemia: Hemoglobin dropped to the range  of 6.7 from 11.  Likely associated with surgery.  Needs blood transfusion.  Ordered but unable to get consent from legal guardian.  We are trying.  Patient is confused ,cannot give consent.  Right bundle branch block: EKG showed new right . 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  Last echo showed EF of 55 to 60%, grade 1 diastolic dysfunction.  Dementia with behavioral disturbance: Has dementia at baseline.  Continue Aricept, Paxil, Depakote          DVT prophylaxis:enoxaparin (LOVENOX) injection 40 mg Start: 04/29/22 1000 SCDs Start: 04/27/22 1610     Code Status: Full Code  Family Communication: Called legal guardian on 10/8, call not received  Patient status:Inpatient  Patient is from :SNF  Anticipated discharge to:SNF  Estimated DC date: 1 to 2 days   Consultants: Ortho  Procedures:ORIF  Antimicrobials:  Anti-infectives (From admission, onward)    Start     Dose/Rate Route Frequency Ordered Stop   04/28/22 1615  cefTRIAXone (ROCEPHIN) 1 g in sodium chloride 0.9 % 100 mL IVPB        1 g 200 mL/hr over 30 Minutes Intravenous Every 24 hours 04/28/22 1523     04/27/22 1700  ceFAZolin (ANCEF) IVPB 2g/100 mL premix        2 g 200 mL/hr over 30 Minutes Intravenous Every 8 hours 04/27/22 1609 04/28/22 1054   04/27/22 0900  ceFAZolin (ANCEF) IVPB 2g/100 mL premix  Status:  Discontinued        2 g 200 mL/hr over 30 Minutes Intravenous On call to O.R. 04/27/22 3154 04/27/22 1558       Subjective: Patient seen and examined at the bedside today.  Hemodynamically stable .  Comfortable, lying in bed.  Confused.  Complains of pain in the right hip  Objective: Vitals:   04/29/22 0500 04/29/22 0657 04/29/22 0815 04/29/22 0831  BP: 127/71 (!) 115/49 (!) 122/41   Pulse: 69  76   Resp: 16  15 16   Temp:   97.8 F (36.6 C)   TempSrc:   Axillary   SpO2: 98%     Weight:      Height:        Intake/Output Summary (Last 24 hours) at 04/29/2022 1108 Last  data filed at 04/29/2022 1013 Gross per 24 hour  Intake 2000 ml  Output 600 ml  Net 1400 ml   Filed Weights   04/27/22 0825  Weight: 60 kg    Examination:   General exam: Overall comfortable, not in distress, lying in bed HEENT: PERRL Respiratory system:  no wheezes or crackles  Cardiovascular system: S1 & S2 heard, RRR.  Gastrointestinal system: Abdomen is nondistended, soft and nontender. Central nervous system: Alert and awake but not oriented Extremities: No edema, no clubbing ,no cyanosis.  Surgical wound in the right hip Skin: No rashes, no ulcers,no icterus     Data Reviewed: I have personally reviewed following labs and imaging studies  CBC: Recent Labs  Lab 04/26/22 1009 04/27/22 1647 04/28/22 0221 04/29/22 0322  WBC 13.7* 19.1* 15.2* 11.2*  NEUTROABS 9.2*  --   --   --   HGB 11.9* 8.2* 7.4* 6.7*  HCT 35.4* 24.2* 21.4* 19.8*  MCV 97.0 98.4 100.9* 98.0  PLT 378 299 252 210   Basic Metabolic Panel: Recent Labs  Lab 04/26/22 1009 04/28/22 0221 04/29/22 0322  NA 136 137 140  K 4.4 4.4 4.0  CL 102 103 108  CO2 21* 23 27  GLUCOSE 122* 131* 108*  BUN 25* 31* 21  CREATININE 1.12* 1.18* 0.73  CALCIUM 9.5 8.5* 8.3*     Recent Results (from the past 240 hour(s))  Surgical pcr screen     Status: None   Collection Time: 04/27/22  8:55 AM   Specimen: Nasal Mucosa; Nasal Swab  Result Value Ref Range Status   MRSA, PCR NEGATIVE NEGATIVE Final   Staphylococcus aureus NEGATIVE NEGATIVE Final    Comment: (NOTE) The Xpert SA Assay (FDA approved for NASAL specimens in patients 80 years of age and older), is one component of a comprehensive surveillance program. It is not intended to diagnose infection nor to guide or monitor treatment. Performed at [facility], [address], [place], [place]      Radiology Studies: DG CHEST PORT 1 VIEW  Result Date: 04/28/2022 CLINICAL DATA:  Leukocytosis EXAM: PORTABLE CHEST 1 VIEW COMPARISON:   04/26/2022 FINDINGS: Minimal left base atelectasis. Right lung clear. Heart is normal size. Aortic calcifications. Mediastinal contours within normal limits. No effusions or acute bony abnormality. IMPRESSION: Minimal left base atelectasis.  No active disease. Electronically Signed   By: 06/26/2022 M.D.   On: 04/28/2022 13:22   DG HIP PORT UNILAT W OR W/O PELVIS 1V RIGHT  Result Date: 04/28/2022 CLINICAL DATA:  ORIF RIGHT femur fracture. EXAM: DG HIP (WITH OR WITHOUT PELVIS) 3V PORT RIGHT COMPARISON:  Prior studies FINDINGS: Intramedullary rod and screws traversing an intertrochanteric RIGHT femur fracture are noted. 5 mm offset at the primary fracture line noted. Displaced lesser trochanter fragment again noted. IMPRESSION: ORIF intertrochanteric RIGHT femur fracture. Electronically Signed   By: 06/28/2022 M.D.   On: 04/28/2022 09:28   DG  HIP UNILAT WITH PELVIS 1V RIGHT  Result Date: 04/27/2022 CLINICAL DATA:  Imaging for right femur fracture ORIF. EXAM: OPERATIVE RIGHT HIP (WITH PELVIS IF PERFORMED) 7 VIEWS TECHNIQUE: Fluoroscopic spot image(s) were submitted for interpretation post-operatively. Fluoro time: 1 minute and 48 seconds. Dose: 16.56 mGy. COMPARISON:  04/26/2022 FINDINGS: Submitted images show placement of a short intramedullary rod supporting 2 screws, reducing the primary intertrochanteric fracture components. Fracture components are well aligned. Orthopedic hardware is well seated. IMPRESSION: Fluoroscopy provided for right proximal femur fracture ORIF. Electronically Signed   By: Lajean Manes M.D.   On: 04/27/2022 11:37   DG C-Arm 1-60 Min-No Report  Result Date: 04/27/2022 Fluoroscopy was utilized by the requesting physician.  No radiographic interpretation.    Scheduled Meds:  sodium chloride   Intravenous Once   celecoxib  200 mg Oral Daily   divalproex  250 mg Oral BID   docusate sodium  100 mg Oral BID   donepezil  5 mg Oral QHS   enoxaparin (LOVENOX) injection  40 mg  Subcutaneous Q24H   fentaNYL (SUBLIMAZE) injection  12.5 mcg Intravenous Once   latanoprost  1 drop Both Eyes QHS   multivitamin  1 tablet Oral Daily   PARoxetine  10 mg Oral Daily   senna-docusate  1 tablet Oral QHS   Continuous Infusions:  sodium chloride 75 mL/hr at 04/28/22 0206   cefTRIAXone (ROCEPHIN)  IV       LOS: 3 days   Shelly Coss, MD [facility] P10/02/2022, 11:08 AM

## 2022-04-30 DIAGNOSIS — S72141A Displaced intertrochanteric fracture of right femur, initial encounter for closed fracture: Secondary | ICD-10-CM | POA: Diagnosis not present

## 2022-04-30 LAB — TYPE AND SCREEN
ABO/RH(D): A POS
Antibody Screen: NEGATIVE
Unit division: 0

## 2022-04-30 LAB — CBC
HCT: 18.4 % — ABNORMAL LOW (ref 36.0–46.0)
Hemoglobin: 6.7 g/dL — CL (ref 12.0–15.0)
MCH: 36.6 pg — ABNORMAL HIGH (ref 26.0–34.0)
MCHC: 36.4 g/dL — ABNORMAL HIGH (ref 30.0–36.0)
MCV: 100.5 fL — ABNORMAL HIGH (ref 80.0–100.0)
Platelets: 280 10*3/uL (ref 150–400)
RBC: 1.83 MIL/uL — ABNORMAL LOW (ref 3.87–5.11)
RDW: 13.1 % (ref 11.5–15.5)
WBC: 11.9 10*3/uL — ABNORMAL HIGH (ref 4.0–10.5)
nRBC: 0.2 % (ref 0.0–0.2)

## 2022-04-30 LAB — BPAM RBC
Blood Product Expiration Date: 202310262359
ISSUE DATE / TIME: 202310051039
Unit Type and Rh: 6200

## 2022-04-30 LAB — VITAMIN D 25 HYDROXY (VIT D DEFICIENCY, FRACTURES): Vit D, 25-Hydroxy: 18.07 ng/mL — ABNORMAL LOW (ref 30–100)

## 2022-04-30 MED ORDER — VITAMIN D 125 MCG (5000 UT) PO CAPS: 1.0000 | ORAL_CAPSULE | Freq: Every day | ORAL | 2 refills | Status: AC

## 2022-04-30 MED ORDER — VITAMIN D (ERGOCALCIFEROL) 1.25 MG (50000 UNIT) PO CAPS
50000.0000 [IU] | ORAL_CAPSULE | ORAL | Status: DC
  Administered 2022-04-30: 50000 [IU] via ORAL
  Filled 2022-04-30: qty 1

## 2022-04-30 MED ORDER — FERROUS SULFATE 325 (65 FE) MG PO TABS
325.0000 mg | ORAL_TABLET | Freq: Two times a day (BID) | ORAL | Status: DC
  Administered 2022-04-30 – 2022-05-03 (×6): 325 mg via ORAL
  Filled 2022-04-30 (×6): qty 1

## 2022-04-30 MED ORDER — ASPIRIN 325 MG PO TBEC: 325.0000 mg | DELAYED_RELEASE_TABLET | Freq: Every day | ORAL | 0 refills | Status: AC

## 2022-04-30 MED ORDER — VITAMIN D 25 MCG (1000 UNIT) PO TABS: 2000.0000 [IU] | ORAL_TABLET | Freq: Two times a day (BID) | ORAL | Status: DC

## 2022-04-30 MED ORDER — SODIUM CHLORIDE 0.9% FLUSH
10.0000 mL | Freq: Two times a day (BID) | INTRAVENOUS | Status: DC
  Administered 2022-04-30 – 2022-05-03 (×3): 10 mL

## 2022-04-30 MED ORDER — HYDROCODONE-ACETAMINOPHEN 5-325 MG PO TABS: 1.0000 | ORAL_TABLET | Freq: Four times a day (QID) | ORAL | 0 refills | Status: AC | PRN

## 2022-04-30 MED ORDER — VITAMIN D 125 MCG (5000 UT) PO CAPS: 1.0000 | ORAL_CAPSULE | Freq: Every day | ORAL | 2 refills | Status: DC

## 2022-04-30 MED ORDER — CHLORHEXIDINE GLUCONATE CLOTH 2 % EX PADS
6.0000 | MEDICATED_PAD | Freq: Every day | CUTANEOUS | Status: DC
  Administered 2022-04-30 – 2022-05-03 (×4): 6 via TOPICAL

## 2022-04-30 MED ORDER — SODIUM CHLORIDE 0.9% FLUSH
10.0000 mL | INTRAVENOUS | Status: DC | PRN
  Administered 2022-05-02: 10 mL

## 2022-04-30 NOTE — Progress Notes (Signed)

## 2022-04-30 NOTE — Progress Notes (Addendum)
PROGRESS NOTE  Cassidy [name]  [id number] DOB: 1933-09-23 DOA: 04/26/2022 PCP: Patient, No Pcp Per   Brief Narrative:  Patient is a 88 year old female with history of hypertension, COPD, osteoarthritis, dementia who presented with right hip pain from [facility] facility after a fall.  Patient was confused ,unable to obtain significant history.  She is under legal guardianship of [facility].  On presentation, she was hemodynamically stable.  Lab work showed WBC of 13.7, creatinine of 1.1.  X-ray showed right femoral intertrochanteric fracture with mild superior displacement of the distal fracture component, right knee arthroplasty with intact hardware.  CT head did not show any acute findings.  Orthopedics consulted.  Underwent nailing of right intertrochanteric femur fracture on 10/6.  Postoperatively, she developed acute blood loss anemia requiring blood transfusion .PT/OT recommending SNF on discharge.  She would be medically stable for discharge tomorrow.  Assessment & Plan:  Principal Problem:   Closed intertrochanteric fracture of right hip [facility]) Active Problems:   Fall   Leukocytosis   Right bundle branch block   Dementia with behavioral disturbance (HCC)   Renal insufficiency   Closed right hip fracture (HCC)   Right intertrochanteric fracture secondary to fall: Developed right hip pain after she fell.  X-rays revealed an acute right femoral intertrochanteric fracture with mild superior displacement of the distal fracture component.  Underwent nailing of right intertrochanteric femur fracture on 10/6.  PT/OT recommending SNF on discharge  Continue pain management, supportive care.  DVT prophylaxis with aspirin, PT/OT evaluation ,recommending SNF on discharge Continue bowel regimen  Leukocytosis: Likely reactive.  Continue to monitor.  No clear signs of infection.  UA showed leukocytes, urine cultures showed no growth.  Ceftriaxone discontinued .chest x-ray  did not show pneumonia..  AKI: Resolved with IV fluids  Acute blood loss normocytic anemia: Hemoglobin dropped to the range of 6.7 from 11.  Likely associated with surgery.  Being transfused with a unit of PRBC.  Check CBC tomorrow  Right bundle branch block: EKG showed new right . 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  Last echo showed EF of 55 to 123456, grade 1 diastolic dysfunction.  Dementia with behavioral disturbance: Has dementia at baseline.  Continue Aricept, Paxil, Depakote  Vitamin D deficiency: Started on supplementation          DVT prophylaxis:enoxaparin (LOVENOX) injection 40 mg Start: 04/29/22 1000 SCDs Start: 04/27/22 1610     Code Status: Full Code  Family Communication: Called legal guardian on 10/8, call not received  Patient status:Inpatient  Patient is from :SNF  Anticipated discharge to:SNF  Estimated DC date: Tomorrow  Consultants: Ortho  Procedures:ORIF  Antimicrobials:  Anti-infectives (From admission, onward)    Start     Dose/Rate Route Frequency Ordered Stop   04/28/22 1615  cefTRIAXone (ROCEPHIN) 1 g in sodium chloride 0.9 % 100 mL IVPB  Status:  Discontinued        1 g 200 mL/hr over 30 Minutes Intravenous Every 24 hours 04/28/22 1523 04/30/22 0814   04/27/22 1700  ceFAZolin (ANCEF) IVPB 2g/100 mL premix        2 g 200 mL/hr over 30 Minutes Intravenous Every 8 hours 04/27/22 1609 04/28/22 1054   04/27/22 0900  ceFAZolin (ANCEF) IVPB 2g/100 mL premix  Status:  Discontinued        2 g 200 mL/hr over 30 Minutes Intravenous On call to O.R. 04/27/22 0834 04/27/22 1558       Subjective: Patient seen and  examined at the bedside today.  Hemodynamically stable.  Lying in bed.  Confused but not agitated.  Not in any kind of distress Objective: Vitals:   04/29/22 0831 04/29/22 2059 04/30/22 0334 04/30/22 0936  BP:  (!) 112/38 (!) 128/42 121/64  Pulse:  91 74 81  Resp: 16 16 16 17   Temp:  98.7 F (37.1 C) 98.1 F  (36.7 C) 98.7 F (37.1 C)  TempSrc:   Axillary Oral  SpO2:   98% 98%  Weight:      Height:        Intake/Output Summary (Last 24 hours) at 04/30/2022 1256 Last data filed at 04/30/2022 1248 Gross per 24 hour  Intake 70 ml  Output --  Net 70 ml   Filed Weights   04/27/22 0825  Weight: 60 kg    Examination:   General exam: Overall comfortable, not in distress,pleasantly confused HEENT: PERRL Respiratory system:  no wheezes or crackles  Cardiovascular system: S1 & S2 heard, RRR.  Gastrointestinal system: Abdomen is nondistended, soft and nontender. Central nervous system: Alert and awake but not oriented Extremities: No edema, no clubbing ,no cyanosis, surgical wound on the right hip Skin: No rashes, no ulcers,no icterus       Data Reviewed: I have personally reviewed following labs and imaging studies  CBC: Recent Labs  Lab 04/26/22 1009 04/27/22 1647 04/28/22 0221 04/29/22 0322 04/30/22 0240  WBC 13.7* 19.1* 15.2* 11.2* 11.9*  NEUTROABS 9.2*  --   --   --   --   HGB 11.9* 8.2* 7.4* 6.7* 6.7*  HCT 35.4* 24.2* 21.4* 19.8* 18.4*  MCV 97.0 98.4 100.9* 98.0 100.5*  PLT 378 299 252 210 123456   Basic Metabolic Panel: Recent Labs  Lab 04/26/22 1009 04/28/22 0221 04/29/22 0322  NA 136 137 140  K 4.4 4.4 4.0  CL 102 103 108  CO2 21* 23 27  GLUCOSE 122* 131* 108*  BUN 25* 31* 21  CREATININE 1.12* 1.18* 0.73  CALCIUM 9.5 8.5* 8.3*     Recent Results (from the past 240 hour(s))  Surgical pcr screen     Status: None   Collection Time: 04/27/22  8:55 AM   Specimen: Nasal Mucosa; Nasal Swab  Result Value Ref Range Status   MRSA, PCR NEGATIVE NEGATIVE Final   Staphylococcus aureus NEGATIVE NEGATIVE Final    Comment: (NOTE) The Xpert SA Assay (FDA approved for NASAL specimens in patients 63 years of age and older), is one component of a comprehensive surveillance program. It is not intended to diagnose infection nor to guide or monitor treatment. Performed at  [facility], [place] [address], [place], [place] [address]   Urine Culture     Status: None   Collection Time: 04/28/22  3:22 PM   Specimen: Urine, Clean Catch  Result Value Ref Range Status   Specimen Description URINE, CLEAN CATCH  Final   Special Requests NONE  Final   Culture   Final    NO GROWTH Performed at [facility], [place] [address] [place], [place] [address]    Report Status 04/29/2022 FINAL  Final     Radiology Studies: DG CHEST PORT 1 VIEW  Result Date: 04/28/2022 CLINICAL DATA:  Leukocytosis EXAM: PORTABLE CHEST 1 VIEW COMPARISON:  04/26/2022 FINDINGS: Minimal left base atelectasis. Right lung clear. Heart is normal size. Aortic calcifications. Mediastinal contours within normal limits. No effusions or acute bony abnormality. IMPRESSION: Minimal left base atelectasis.  No active disease. Electronically  Signed   By: Rolm Baptise M.D.   On: 04/28/2022 13:22    Scheduled Meds:  sodium chloride   Intravenous Once   celecoxib  200 mg Oral Daily   Chlorhexidine Gluconate Cloth  6 each Topical Daily   divalproex  250 mg Oral BID   docusate sodium  100 mg Oral BID   donepezil  5 mg Oral QHS   enoxaparin (LOVENOX) injection  40 mg Subcutaneous Q24H   fentaNYL (SUBLIMAZE) injection  12.5 mcg Intravenous Once   ferrous sulfate  325 mg Oral BID WC   latanoprost  1 drop Both Eyes QHS   multivitamin  1 tablet Oral Daily   PARoxetine  10 mg Oral Daily   polyethylene glycol  17 g Oral Daily   senna-docusate  1 tablet Oral BID   sodium chloride flush  10-40 mL Intracatheter Q12H   Vitamin D (Ergocalciferol)  50,000 Units Oral Q7 days   Continuous Infusions:     LOS: 4 days   Shelly Coss, MD [facility] P10/03/2022, 12:56 PM

## 2022-04-30 NOTE — Progress Notes (Signed)
OT Cancellation Note  Patient Details Name: LAWRIE TUNKS MRN: [id number] DOB: 09/02/1933   Cancelled Treatment:    Reason Eval/Treat Not Completed: Medical issues which prohibited therapy Patient Hg at 6.7, awaiting blood. Patient is a ward of the [facility] therefore awaiting approval for transfusion. OT will complete treatment when more medically stable.   Corinne Ports E. Ameira Alessandrini, OTR/L Acute Rehabilitation Services [phone]   Ascencion Dike 04/30/2022, 8:07 AM

## 2022-04-30 NOTE — Progress Notes (Signed)
Orthopaedic Trauma Progress Note  SUBJECTIVE: Doing okay today.  Pain controlled.  No chest pain. No SOB. No nausea/vomiting. No other complaints.  Exam slightly limited due to baseline dementia  OBJECTIVE:  Vitals:   04/30/22 0334 04/30/22 0936  BP: (!) 128/42 121/64  Pulse: 74 81  Resp: 16 17  Temp: 98.1 F (36.7 C) 98.7 F (37.1 C)  SpO2: 98% 98%    General: Sitting up in bed, no acute distress Respiratory: No increased work of breathing.  Right lower extremity: Dressings clean, dry, intact.  Mildly tender over the hip as expected.  Tolerates gentle knee range of motion.  Ankle DF/PF intact.  Able to wiggle toes.  Endorses sensation to lower extremity.  Neurovascularly intact.  IMAGING: Stable post op imaging.   LABS:  Results for orders placed or performed during the hospital encounter of 04/26/22 (from the past 24 hour(s))  CBC     Status: Abnormal   Collection Time: 04/30/22  2:40 AM  Result Value Ref Range   WBC 11.9 (H) 4.0 - 10.5 K/uL   RBC 1.83 (L) 3.87 - 5.11 MIL/uL   Hemoglobin 6.7 (LL) 12.0 - 15.0 g/dL   HCT 18.4 (L) 36.0 - 46.0 %   MCV 100.5 (H) 80.0 - 100.0 fL   MCH 36.6 (H) 26.0 - 34.0 pg   MCHC 36.4 (H) 30.0 - 36.0 g/dL   RDW 13.1 11.5 - 15.5 %   Platelets 280 150 - 400 K/uL   nRBC 0.2 0.0 - 0.2 %  VITAMIN D 25 Hydroxy (Vit-D Deficiency, Fractures)     Status: Abnormal   Collection Time: 04/30/22  2:40 AM  Result Value Ref Range   Vit D, 25-Hydroxy 18.07 (L) 30 - 100 ng/mL    ASSESSMENT: Cassidy [name] is a 88 y.o. female, 3 Days Post-Op s/p INTRAMEDULLARY NAILING RIGHT INTERTROCHANTERIC FEMUR FRACTURE  CV/Blood loss: Acute blood loss anemia, Hgb 6.7 this morning. Hemodynamically stable  PLAN: Weightbearing: WBAT RLE ROM: Unrestricted ROM Incisional and dressing care: Okay to leave incisions open to air Showering: Okay to shower and get incisions wet Orthopedic device(s): None  Pain management:  1. Tylenol 650 mg q 6 hours scheduled 2. Norco  5-325 mg q 4 hours PRN 3. Fentanyl 25 mcg q 2 hours PRN 4. Celebrex 200 mg daily VTE prophylaxis: Lovenox,  SCDs ID:  Ancef 2gm post op completed Foley/Lines:  No foley, KVO IVFs Impediments to Fracture Healing: Vitamin D level 18, started on supplementation Dispo: Therapies as tolerated, PT/OT recommended okay for discharge from ortho standpoint once cleared by medicine team and therapies  D/C recommendations: - Norco for pain control - Aspirin 325 mg daily for DVT prophylaxis - Continue Vit D supplementation x 90 days  Follow - up plan: 2 weeks   Contact information:  Katha Hamming MD, Rushie Nyhan PA-C. After hours and holidays please check [email or web address] for group call information for [facility]   Gwinda Passe, PA-C [phone] (office) [email or web address]

## 2022-04-30 NOTE — Plan of Care (Signed)
  Problem: Education: Goal: Verbalization of understanding the information provided (i.e., activity precautions, restrictions, etc) will improve Outcome: Progressing Goal: Individualized Educational Video(s) Outcome: Progressing   Problem: Activity: Goal: Ability to ambulate and perform ADLs will improve Outcome: Not Progressing   Problem: Clinical Measurements: Goal: Postoperative complications will be avoided or minimized Outcome: Progressing   Problem: Self-Concept: Goal: Ability to maintain and perform role responsibilities to the fullest extent possible will improve Outcome: Not Progressing   Problem: Pain Management: Goal: Pain level will decrease Outcome: Progressing   Problem: Education: Goal: Knowledge of General Education information will improve Description: Including pain rating scale, medication(s)/side effects and non-pharmacologic comfort measures Outcome: Progressing   Problem: Health Behavior/Discharge Planning: Goal: Ability to manage health-related needs will improve Outcome: Progressing   Problem: Clinical Measurements: Goal: Ability to maintain clinical measurements within normal limits will improve Outcome: Progressing Goal: Will remain free from infection Outcome: Progressing Goal: Diagnostic test results will improve Outcome: Progressing Goal: Respiratory complications will improve Outcome: Progressing Goal: Cardiovascular complication will be avoided Outcome: Progressing   Problem: Activity: Goal: Risk for activity intolerance will decrease Outcome: Not Progressing   Problem: Nutrition: Goal: Adequate nutrition will be maintained Outcome: Progressing   Problem: Coping: Goal: Level of anxiety will decrease Outcome: Progressing   Problem: Elimination: Goal: Will not experience complications related to bowel motility Outcome: Progressing Goal: Will not experience complications related to urinary retention Outcome: Progressing    Problem: Pain Managment: Goal: General experience of comfort will improve Outcome: Progressing   Problem: Safety: Goal: Ability to remain free from injury will improve Outcome: Progressing   Problem: Skin Integrity: Goal: Risk for impaired skin integrity will decrease Outcome: Progressing

## 2022-04-30 NOTE — TOC Initial Note (Signed)
Transition of Care [facility]) - Initial/Assessment Note    Patient Details  Name: Cassidy [name] MRN: [id number] Date of Birth: 1934/03/13  [facility] CM/SW Contact:    Joanne Chars, LCSW Phone Number: 04/30/2022, 3:00 PM  Clinical Narrative:      Pt oriented x1, has legal guardian:   Hulen Shouts, [place] [employer].  CSW spoke with Ms. Turner, who is in agreement with DC plan for SNF.  She confirmed that pt lives at [facility].  Permission given to send out referral in the hub.  Unsure of covid vaccine status.  Referral sent out in hub.          Expected Discharge Plan: [place] Barriers to Discharge: Continued Medical Work up, SNF Pending bed offer   Patient Goals and CMS Choice     Choice offered to / list presented to : [place] / Guardian ([place], [facility])  Expected Discharge Plan and Services Expected Discharge Plan: [place] In-house Referral: Clinical Social Work   Post Acute Care Choice: [place] Living arrangements for the past 2 months: [place] ([place])                                      Prior Living Arrangements/Services Living arrangements for the past 2 months: [place] ([place]) Lives with:: Facility Resident Patient language and need for interpreter reviewed:: No        Need for Family Participation in Patient Care: Yes (Comment) Care giver support system in place?: Yes (comment) Current home services: Other (comment) (none) Criminal Activity/Legal Involvement Pertinent to Current Situation/Hospitalization: No - Comment as needed  Activities of Daily Living Home Assistive Devices/Equipment: None ADL Screening (condition at time of admission) Patient's cognitive ability adequate to safely complete daily activities?: No Is the patient deaf or have difficulty hearing?: No Does the patient have  difficulty seeing, even when wearing glasses/contacts?: No Does the patient have difficulty concentrating, remembering, or making decisions?: Yes Patient able to express need for assistance with ADLs?: Yes Does the patient have difficulty dressing or bathing?: No Independently performs ADLs?: Yes (appropriate for developmental age) Does the patient have difficulty walking or climbing stairs?: Yes Weakness of Legs: Both Weakness of Arms/Hands: None  Permission Sought/Granted                  Emotional Assessment Appearance:: Appears stated age Attitude/Demeanor/Rapport: Unable to Assess Affect (typically observed): Unable to Assess Orientation: : Oriented to Self      Admission diagnosis:  Closed intertrochanteric fracture of right hip ([place]) [S72.141A] Closed right hip fracture, initial encounter ([place]) [S72.001A] Closed right hip fracture ([place]) [S72.001A] Patient Active Problem List   Diagnosis Date Noted   Closed right hip fracture ([place]) 04/27/2022   Closed intertrochanteric fracture of right hip ([place]) 04/26/2022   Dementia with behavioral disturbance ([place]) 04/26/2022   Fall 04/26/2022   Renal insufficiency 04/26/2022   Right bundle branch block 04/26/2022   Orthostatic hypotension 06/11/2016   Intractable abdominal pain 06/06/2016   Diarrhea 06/06/2016   Leukocytosis 06/06/2016   Essential hypertension    Chest pain, rule out acute myocardial infarction 05/10/2016   Dizziness 05/10/2016   Bradycardia 05/10/2016   Subacute confusional state 05/10/2016   Adjustment disorder with other symptoms 05/10/2016   PCP:  Patient, No Pcp Per Pharmacy:   [facility]  [place], [place] - [address] [place] [address] Phone: [phone] Fax: [phone]  [facility] [place], [place] - [address] [place] [address] Phone: [phone] Fax: [phone]     Social Determinants of Health  (SDOH) Interventions    Readmission Risk Interventions     No data to display

## 2022-05-01 DIAGNOSIS — S72141A Displaced intertrochanteric fracture of right femur, initial encounter for closed fracture: Secondary | ICD-10-CM | POA: Diagnosis not present

## 2022-05-01 LAB — CBC
HCT: 17.5 % — ABNORMAL LOW (ref 36.0–46.0)
Hemoglobin: 6.2 g/dL — CL (ref 12.0–15.0)
MCH: 35 pg — ABNORMAL HIGH (ref 26.0–34.0)
MCHC: 35.4 g/dL (ref 30.0–36.0)
MCV: 98.9 fL (ref 80.0–100.0)
Platelets: 311 10*3/uL (ref 150–400)
RBC: 1.77 MIL/uL — ABNORMAL LOW (ref 3.87–5.11)
RDW: 13 % (ref 11.5–15.5)
WBC: 9.6 10*3/uL (ref 4.0–10.5)
nRBC: 0.3 % — ABNORMAL HIGH (ref 0.0–0.2)

## 2022-05-01 LAB — PREPARE RBC (CROSSMATCH)

## 2022-05-01 LAB — HEMOGLOBIN AND HEMATOCRIT, BLOOD
HCT: 24.3 % — ABNORMAL LOW (ref 36.0–46.0)
Hemoglobin: 8.5 g/dL — ABNORMAL LOW (ref 12.0–15.0)

## 2022-05-01 MED ORDER — SODIUM CHLORIDE 0.9% IV SOLUTION: Freq: Once | INTRAVENOUS | Status: AC

## 2022-05-01 MED ORDER — ASPIRIN 325 MG PO TABS
325.0000 mg | ORAL_TABLET | Freq: Every day | ORAL | Status: DC
  Administered 2022-05-01 – 2022-05-03 (×3): 325 mg via ORAL
  Filled 2022-05-01 (×3): qty 1

## 2022-05-01 NOTE — Progress Notes (Signed)
Received critical result hemoglobin 6.2,  have notified the primary team.  Also, called blood bank a second time.  Seeking new order for transfusion per blood bank request

## 2022-05-01 NOTE — Progress Notes (Signed)
Called blood bank to see if a unit of prbc's is available for transfusion.  It was noted that the results of the recently collected type and screen are still pending but that as soon as they resulted, one can be made available.

## 2022-05-01 NOTE — Progress Notes (Addendum)
Cassidy [name]  [id number] DOB: 02-Apr-1934 DOA: 04/26/2022 PCP: Patient, No Pcp Per   Brief Narrative:  Patient is a 88 year old female with history of hypertension, COPD, osteoarthritis, dementia who presented with right hip pain from [facility] after a fall.  Patient was confused ,unable to obtain significant history.  She is under legal guardianship of [facility].  On presentation, she was hemodynamically stable.  Lab work showed WBC of 13.7, creatinine of 1.1.  X-ray showed right femoral intertrochanteric fracture with mild superior displacement of the distal fracture component, right knee arthroplasty with intact hardware.  CT head did not show any acute findings.  Orthopedics consulted.  Underwent nailing of right intertrochanteric femur fracture on 10/6.  Postoperatively, she developed acute blood loss anemia requiring blood transfusion .PT/OT recommending SNF on discharge.  She is be medically stable for discharge as soon as placement is found.  Assessment & Plan:  Principal Problem:   Closed intertrochanteric fracture of right hip [facility] Active Problems:   Fall   Leukocytosis   Right bundle branch block   Dementia with behavioral disturbance (HCC)   Renal insufficiency   Closed right hip fracture (HCC)   Right intertrochanteric fracture secondary to fall: Developed right hip pain after she fell.  X-rays revealed an acute right femoral intertrochanteric fracture with mild superior displacement of the distal fracture component.  Underwent nailing of right intertrochanteric femur fracture on 10/6.  PT/OT recommending SNF on discharge  Continue pain management, supportive care.  DVT prophylaxis with aspirin, PT/OT evaluation ,recommending SNF on discharge Continue bowel regimen. She needs to follow-up with orthopedics in 2 weeks for wound check and x-rays.  Leukocytosis: Likely reactive. Now resolved.   No clear signs of infection.  UA  showed leukocytes, urine cultures showed no growth.  Ceftriaxone discontinued .chest x-ray did not show pneumonia..  AKI: Resolved with IV fluids  Acute blood loss normocytic anemia: Hemoglobin dropped to the range of 6 from 11.  Likely associated with surgery.  Being transfused with a unit of PRBC.  Check CBC tomorrow  Right bundle branch block: EKG showed new right . 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  Last echo showed EF of 55 to 40%, grade 1 diastolic dysfunction.  Dementia with behavioral disturbance: Has dementia at baseline.  Continue Aricept, Paxil, Depakote  Vitamin D deficiency: Started on supplementation          DVT prophylaxis:aspirin     Code Status: Full Code  Family Communication: Called legal guardian on 10/8, call not received  Patient status:Inpatient  Patient is from :SNF  Anticipated discharge to:SNF  Estimated DC date: as soon as possible  Consultants: Ortho  Procedures:ORIF  Antimicrobials:  Anti-infectives (From admission, onward)    Start     Dose/Rate Route Frequency Ordered Stop   04/28/22 1615  cefTRIAXone (ROCEPHIN) 1 g in sodium chloride 0.9 % 100 mL IVPB  Status:  Discontinued        1 g 200 mL/hr over 30 Minutes Intravenous Every 24 hours 04/28/22 1523 04/30/22 0814   04/27/22 1700  ceFAZolin (ANCEF) IVPB 2g/100 mL premix        2 g 200 mL/hr over 30 Minutes Intravenous Every 8 hours 04/27/22 1609 04/28/22 1054   04/27/22 0900  ceFAZolin (ANCEF) IVPB 2g/100 mL premix  Status:  Discontinued        2 g 200 mL/hr over 30 Minutes Intravenous On call to O.R. 04/27/22 9811 04/27/22 1558  Subjective:  Patient seen and examined at bedside today.  Hemodynamically stable, lying in bed.  Getting blood transfusion.  Patient is demented, remains confused and does not participate with meaningful conversation  Objective: Vitals:   05/01/22 0513 05/01/22 0636 05/01/22 0705 05/01/22 1045  BP: 124/65 (!) 119/55 (!)  107/39 (!) 128/48  Pulse:   65 66  Resp: 18 17 18 15   Temp:  98.4 F (36.9 C) 98.6 F (37 C)   TempSrc:  Oral Oral   SpO2: 98% 100% 99%   Weight:      Height:        Intake/Output Summary (Last 24 hours) at 05/01/2022 1247 Last data filed at 05/01/2022 1045 Gross per 24 hour  Intake 296.67 ml  Output 450 ml  Net -153.33 ml   Filed Weights   04/27/22 0825  Weight: 60 kg    Examination:   General exam: Overall comfortable, not in distress,confused HEENT: PERRL Respiratory system:  no wheezes or crackles  Cardiovascular system: S1 & S2 heard, RRR.  Gastrointestinal system: Abdomen is nondistended, soft and nontender. Central nervous system: Alert and oriented Extremities: No edema, no clubbing ,no cyanosis,surgical wound on right hip Skin: No rashes, no ulcers,no icterus     Data Reviewed: I have personally reviewed following labs and imaging studies  CBC: Recent Labs  Lab 04/26/22 1009 04/27/22 1647 04/28/22 0221 04/29/22 0322 04/30/22 0240 05/01/22 0238  WBC 13.7* 19.1* 15.2* 11.2* 11.9* 9.6  NEUTROABS 9.2*  --   --   --   --   --   HGB 11.9* 8.2* 7.4* 6.7* 6.7* 6.2*  HCT 35.4* 24.2* 21.4* 19.8* 18.4* 17.5*  MCV 97.0 98.4 100.9* 98.0 100.5* 98.9  PLT 378 299 252 210 280 [id number]   Basic Metabolic Panel: Recent Labs  Lab 04/26/22 1009 04/28/22 0221 04/29/22 0322  NA 136 137 140  K 4.4 4.4 4.0  CL 102 103 108  CO2 21* 23 27  GLUCOSE 122* 131* 108*  BUN 25* 31* 21  CREATININE 1.12* 1.18* 0.73  CALCIUM 9.5 8.5* 8.3*     Recent Results (from the past 240 hour(s))  Surgical pcr screen     Status: None   Collection Time: 04/27/22  8:55 AM   Specimen: Nasal Mucosa; Nasal Swab  Result Value Ref Range Status   MRSA, PCR NEGATIVE NEGATIVE Final   Staphylococcus aureus NEGATIVE NEGATIVE Final    Comment: (NOTE) The Xpert SA Assay (FDA approved for NASAL specimens in patients 56 years of age and older), is one component of a comprehensive surveillance  program. It is not intended to diagnose infection nor to guide or monitor treatment. Performed at [facility], [place] [address], [place], [place] [address]   Urine Culture     Status: None   Collection Time: 04/28/22  3:22 PM   Specimen: Urine, Clean Catch  Result Value Ref Range Status   Specimen Description URINE, CLEAN CATCH  Final   Special Requests NONE  Final   Culture   Final    NO GROWTH Performed at [facility], [place] [address], [place], [place] [address]    Report Status 04/29/2022 FINAL  Final     Radiology Studies: No results found.  Scheduled Meds:  sodium chloride   Intravenous Once   aspirin  325 mg Oral Daily   celecoxib  200 mg Oral Daily   Chlorhexidine Gluconate Cloth  6 each Topical Daily   divalproex  250 mg Oral  BID   docusate sodium  100 mg Oral BID   donepezil  5 mg Oral QHS   fentaNYL (SUBLIMAZE) injection  12.5 mcg Intravenous Once   ferrous sulfate  325 mg Oral BID WC   latanoprost  1 drop Both Eyes QHS   multivitamin  1 tablet Oral Daily   PARoxetine  10 mg Oral Daily   polyethylene glycol  17 g Oral Daily   senna-docusate  1 tablet Oral BID   sodium chloride flush  10-40 mL Intracatheter Q12H   Vitamin D (Ergocalciferol)  50,000 Units Oral Q7 days   Continuous Infusions:     LOS: 5 days   Shelly Coss, MD [facility] P10/04/2022, 12:47 PM

## 2022-05-01 NOTE — Plan of Care (Signed)
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
## 2022-05-01 NOTE — Progress Notes (Signed)
Physical Therapy Treatment Patient Details Name: Cassidy [name] MRN: [id number] DOB: September 26, 1933 Today's Date: 05/01/2022   History of Present Illness 88 y/o female presented to ED on 04/26/22 following fall. Sustained R femoral intertrochanteric fx. S/p cephalomedullary nail of R femur fx on 10/6. PMH: HTN, COPD, dementia    PT Comments    Pt seen to promote mobility, sitting balance, and activity tolerance. Pt requiring two person total assist for bed mobility. Initially mildly combative, however, calmed down after sitting edge of bed for period of time. Offered water, back rub and calming music and pt verbalized appreciation. Continue to recommend SNF for ongoing Physical Therapy.      Recommendations for follow up therapy are one component of a multi-disciplinary discharge planning process, led by the attending physician.  Recommendations may be updated based on patient status, additional functional criteria and insurance authorization.  Follow Up Recommendations  Skilled nursing-short term rehab (<3 hours/day) Can patient physically be transported by private vehicle: No   Assistance Recommended at Discharge Frequent or constant Supervision/Assistance  Patient can return home with the following Two people to help with walking and/or transfers;Two people to help with bathing/dressing/bathroom   Equipment Recommendations  Other (comment) (defer)    Recommendations for Other Services       Precautions / Restrictions Precautions Precautions: Fall Restrictions Weight Bearing Restrictions: Yes RLE Weight Bearing: Weight bearing as tolerated     Mobility  Bed Mobility Overal bed mobility: Needs Assistance Bed Mobility: Supine to Sit, Sit to Supine     Supine to sit: Total assist, +2 for physical assistance Sit to supine: Total assist, +2 for physical assistance   General bed mobility comments: TotalA + 2 for supine <> sit, with use of bed pad and helicopter method to progress  to edge of bed    Transfers                        Ambulation/Gait                   Stairs             Wheelchair Mobility    Modified Rankin (Stroke Patients Only)       Balance Overall balance assessment: Needs assistance Sitting-balance support: Feet unsupported, Bilateral upper extremity supported Sitting balance-Leahy Scale: Poor Sitting balance - Comments: reliant on BUE support, supervision for safety                                    Cognition Arousal/Alertness: Awake/alert Behavior During Therapy: Restless, Agitated, Anxious Overall Cognitive Status: History of cognitive impairments - at baseline                                 General Comments: hx of dementia        Exercises      General Comments        Pertinent Vitals/Pain Pain Assessment Pain Assessment: PAINAD Breathing: normal Negative Vocalization: repeated troubled calling out, loud moaning/groaning, crying Facial Expression: facial grimacing Body Language: rigid, fists clenched, knees up, pushing/pulling away, strikes out Consolability: unable to console, distract or reassure PAINAD Score: 8 Pain Location: R LE with touching or movement Pain Descriptors / Indicators: Grimacing, Guarding, Crying, Moaning Pain Intervention(s): Limited activity within patient's tolerance, Monitored during session, Repositioned  Home Living                          Prior Function            PT Goals (current goals can now be found in the care plan section) Acute Rehab PT Goals Patient Stated Goal: did not state Potential to Achieve Goals: Poor    Frequency    Min 2X/week      PT Plan Current plan remains appropriate    Co-evaluation              AM-PAC PT "6 Clicks" Mobility   Outcome Measure  Help needed turning from your back to your side while in a flat bed without using bedrails?: Total Help needed moving from  lying on your back to sitting on the side of a flat bed without using bedrails?: Total Help needed moving to and from a bed to a chair (including a wheelchair)?: Total Help needed standing up from a chair using your arms (e.g., wheelchair or bedside chair)?: Total Help needed to walk in hospital room?: Total Help needed climbing 3-5 steps with a railing? : Total 6 Click Score: 6    End of Session   Activity Tolerance: Patient limited by pain Patient left: in bed;with call bell/phone within reach;with bed alarm set Nurse Communication: Mobility status PT Visit Diagnosis: Muscle weakness (generalized) (M62.81);History of falling (Z91.81);Difficulty in walking, not elsewhere classified (R26.2)     Time: 6063-0160 PT Time Calculation (min) (ACUTE ONLY): 17 min  Charges:  $Therapeutic Activity: 8-22 mins                     Wyona Almas, PT, DPT Acute Rehabilitation Services Office [phone]    Deno Etienne 05/01/2022, 2:28 PM

## 2022-05-01 NOTE — Care Management Important Message (Signed)
Important Message  Patient Details  Name: Cassidy [name] MRN: [id number] Date of Birth: Oct 19, 1933   Medicare Important Message Given:  Yes     Hannah Beat 05/01/2022, 10:55 AM

## 2022-05-02 ENCOUNTER — Encounter (HOSPITAL_COMMUNITY): Payer: Self-pay | Admitting: Student

## 2022-05-02 DIAGNOSIS — S72141A Displaced intertrochanteric fracture of right femur, initial encounter for closed fracture: Secondary | ICD-10-CM | POA: Diagnosis not present

## 2022-05-02 LAB — CBC
HCT: 28.3 % — ABNORMAL LOW (ref 36.0–46.0)
Hemoglobin: 9.7 g/dL — ABNORMAL LOW (ref 12.0–15.0)
MCH: 32.3 pg (ref 26.0–34.0)
MCHC: 34.3 g/dL (ref 30.0–36.0)
MCV: 94.3 fL (ref 80.0–100.0)
Platelets: 426 10*3/uL — ABNORMAL HIGH (ref 150–400)
RBC: 3 MIL/uL — ABNORMAL LOW (ref 3.87–5.11)
RDW: 15 % (ref 11.5–15.5)
WBC: 13.4 10*3/uL — ABNORMAL HIGH (ref 4.0–10.5)
nRBC: 0.5 % — ABNORMAL HIGH (ref 0.0–0.2)

## 2022-05-02 LAB — BASIC METABOLIC PANEL
Anion gap: 7 (ref 5–15)
BUN: 22 mg/dL (ref 8–23)
CO2: 27 mmol/L (ref 22–32)
Calcium: 8.9 mg/dL (ref 8.9–10.3)
Chloride: 109 mmol/L (ref 98–111)
Creatinine, Ser: 0.79 mg/dL (ref 0.44–1.00)
GFR, Estimated: >60 mL/min (ref >60)
Glucose, Bld: 148 mg/dL — ABNORMAL HIGH (ref 70–99)
Potassium: 4.6 mmol/L (ref 3.5–5.1)
Sodium: 143 mmol/L (ref 135–145)

## 2022-05-02 LAB — TYPE AND SCREEN
ABO/RH(D): A POS
Antibody Screen: NEGATIVE
Unit division: 0

## 2022-05-02 LAB — BPAM RBC
Blood Product Expiration Date: 202311082359
ISSUE DATE / TIME: 202310100644
Unit Type and Rh: 6200

## 2022-05-02 NOTE — TOC Progression Note (Addendum)
Transition of Care [facility]) - Progression Note    Patient Details  Name: Cassidy [name] MRN: [id number] Date of Birth: May 17, 1934  Transition of [facility]) CM/SW Contact  Joanne Chars, LCSW Phone Number: 05/02/2022, 2:09 PM  Clinical Narrative:   No bed offers.  Search expanded.    1600: TC [name]/legal guardian DSS.  Discussed one bed offer.  She requested response from [facility], Discussed DC tomorrow.   CSW spoke with Gina/[facility], cannot offer bed.  Expected Discharge Plan: [place] Barriers to Discharge: Continued Medical Work up, SNF Pending bed offer  Expected Discharge Plan and Services Expected Discharge Plan: [place] In-house Referral: Clinical Social Work   Post Acute Care Choice: [place] Living arrangements for the past 2 months: [place] ([place])                                       Social Determinants of Health (SDOH) Interventions    Readmission Risk Interventions     No data to display

## 2022-05-02 NOTE — Progress Notes (Signed)
PROGRESS NOTE    Cassidy [name]  WUJ:[id number] DOB: 10-06-1933 DOA: 04/26/2022 PCP: Patient, No Pcp Per   Brief Narrative:  Patient is a 88 year old female with history of hypertension, COPD, osteoarthritis, dementia who presented with right hip pain from [facility] nursing facility after a fall.  Patient was confused ,unable to obtain significant history.  She is under legal guardianship of [facility].  On presentation, she was hemodynamically stable.  Lab work showed WBC of 13.7, creatinine of 1.1.  X-ray showed right femoral intertrochanteric fracture with mild superior displacement of the distal fracture component, right knee arthroplasty with intact hardware.  CT head did not show any acute findings.  Orthopedics consulted.  Underwent nailing of right intertrochanteric femur fracture on 10/6.  Postoperatively, she developed acute blood loss anemia requiring blood transfusion .PT/OT recommending SNF on discharge.  She is be medically stable for discharge as soon as placement is found.  Assessment & Plan:   Principal Problem:   Closed intertrochanteric fracture of right hip [facility]) Active Problems:   Fall   Leukocytosis   Right bundle branch block   Dementia with behavioral disturbance (HCC)   Renal insufficiency   Closed right hip fracture (HCC)  Right intertrochanteric fracture secondary to fall: Developed right hip pain after she fell.  X-rays revealed an acute right femoral intertrochanteric fracture with mild superior displacement of the distal fracture component.  Underwent nailing of right intertrochanteric femur fracture on 10/6.  PT/OT recommending SNF on discharge  Continue pain management, supportive care.  DVT prophylaxis with aspirin, PT/OT evaluation ,recommending SNF on discharge Continue bowel regimen. She needs to follow-up with orthopedics in 2 weeks for wound check and x-rays. Patient is from memory care unit.  Per TOC, she is going to be difficult to  place    Leukocytosis: Likely reactive. Now resolved.   No clear signs of infection.  UA showed leukocytes, urine cultures showed no growth.  Ceftriaxone discontinued .chest x-ray did not show pneumonia..   AKI: Resolved with IV fluids   Acute blood loss normocytic anemia: Hemoglobin dropped to the range of 6 from 11 on 05/01/2022 likely associated with surgery, she received 100 PRBC transfusion, hemoglobin improved over 8 today.   Right bundle branch block: EKG showed new right . 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  Last echo showed EF of 55 to 60%, grade 1 diastolic dysfunction.   Dementia with behavioral disturbance: Has dementia at baseline.  Continue Aricept, Paxil, Depakote   Vitamin D deficiency: Started on supplementation   DVT prophylaxis: SCDs Start: 04/27/22 1610 due to drop in hemoglobin other day on 05/01/2022 receiving blood transfusion   Code Status: Full Code  Family Communication:  None present at bedside.  Plan of care discussed with patient in length and he/she verbalized understanding and agreed with it.  Status is: Inpatient Remains inpatient appropriate because: Medically stable, pending placement.   Estimated body mass index is 22.71 kg/m as calculated from the following:   Height as of this encounter: 5\' 4"  (1.626 m).   Weight as of this encounter: 60 kg.    Nutritional Assessment: Body mass index is 22.71 kg/m. Seen by dietician.  I agree with the assessment and plan as outlined below: Nutrition Status:        . Skin Assessment: I have examined the patient's skin and I agree with the wound assessment as performed by the wound care RN as outlined below:    Consultants:  Ortho  Procedures:  As above  Antimicrobials:  Anti-infectives (From admission, onward)    Start     Dose/Rate Route Frequency Ordered Stop   04/28/22 1615  cefTRIAXone (ROCEPHIN) 1 g in sodium chloride 0.9 % 100 mL IVPB  Status:  Discontinued        1  g 200 mL/hr over 30 Minutes Intravenous Every 24 hours 04/28/22 1523 04/30/22 0814   04/27/22 1700  ceFAZolin (ANCEF) IVPB 2g/100 mL premix        2 g 200 mL/hr over 30 Minutes Intravenous Every 8 hours 04/27/22 1609 04/28/22 1054   04/27/22 0900  ceFAZolin (ANCEF) IVPB 2g/100 mL premix  Status:  Discontinued        2 g 200 mL/hr over 30 Minutes Intravenous On call to O.R. 04/27/22 0834 04/27/22 1558         Subjective: Patient seen and examined.  She complains of feeling cold but no other complaint.  She was alert and oriented to self and place only.  Objective: Vitals:   05/01/22 1509 05/01/22 2000 05/02/22 0436 05/02/22 0945  BP: (!) 106/57 (!) 120/58 125/82 (!) 129/49  Pulse: 79 67 94 61  Resp: 18 18 18 16   Temp: 98.4 F (36.9 C) 98.1 F (36.7 C)  98.9 F (37.2 C)  TempSrc: Oral Oral    SpO2: 99% 99% 92% 99%  Weight:      Height:        Intake/Output Summary (Last 24 hours) at 05/02/2022 1006 Last data filed at 05/01/2022 1400 Gross per 24 hour  Intake 406.67 ml  Output --  Net 406.67 ml   Filed Weights   04/27/22 0825  Weight: 60 kg    Examination:  General exam: Appears calm and comfortable  Respiratory system: Clear to auscultation. Respiratory effort normal. Cardiovascular system: S1 & S2 heard, RRR. No JVD, murmurs, rubs, gallops or clicks. No pedal edema. Gastrointestinal system: Abdomen is nondistended, soft and nontender. No organomegaly or masses felt. Normal bowel sounds heard. Central nervous system: Alert and oriented x1-2. No focal neurological deficits. Skin: No rashes, lesions or ulcers  Data Reviewed: I have personally reviewed following labs and imaging studies  CBC: Recent Labs  Lab 04/26/22 1009 04/27/22 1647 04/28/22 0221 04/29/22 0322 04/30/22 0240 05/01/22 0238 05/01/22 1315  WBC 13.7* 19.1* 15.2* 11.2* 11.9* 9.6  --   NEUTROABS 9.2*  --   --   --   --   --   --   HGB 11.9* 8.2* 7.4* 6.7* 6.7* 6.2* 8.5*  HCT 35.4* 24.2*  21.4* 19.8* 18.4* 17.5* 24.3*  MCV 97.0 98.4 100.9* 98.0 100.5* 98.9  --   PLT 378 299 252 210 280 311  --    Basic Metabolic Panel: Recent Labs  Lab 04/26/22 1009 04/28/22 0221 04/29/22 0322  NA 136 137 140  K 4.4 4.4 4.0  CL 102 103 108  CO2 21* 23 27  GLUCOSE 122* 131* 108*  BUN 25* 31* 21  CREATININE 1.12* 1.18* 0.73  CALCIUM 9.5 8.5* 8.3*   GFR: Estimated Creatinine Clearance: 43.6 mL/min (by C-G formula based on SCr of 0.73 mg/dL). Liver Function Tests: Recent Labs  Lab 04/26/22 1009  AST 31  ALT 21  ALKPHOS 45  BILITOT 0.5  PROT 6.6  ALBUMIN 3.3*   No results for input(s): "LIPASE", "AMYLASE" in the last 168 hours. No results for input(s): "AMMONIA" in the last 168 hours. Coagulation Profile: No results for input(s): "INR", "PROTIME" in the last 168  hours. Cardiac Enzymes: No results for input(s): "CKTOTAL", "CKMB", "CKMBINDEX", "TROPONINI" in the last 168 hours. BNP (last 3 results) No results for input(s): "PROBNP" in the last 8760 hours. HbA1C: No results for input(s): "HGBA1C" in the last 72 hours. CBG: Recent Labs  Lab 04/26/22 1419  GLUCAP 140*   Lipid Profile: No results for input(s): "CHOL", "HDL", "LDLCALC", "TRIG", "CHOLHDL", "LDLDIRECT" in the last 72 hours. Thyroid Function Tests: No results for input(s): "TSH", "T4TOTAL", "FREET4", "T3FREE", "THYROIDAB" in the last 72 hours. Anemia Panel: No results for input(s): "VITAMINB12", "FOLATE", "FERRITIN", "TIBC", "IRON", "RETICCTPCT" in the last 72 hours. Sepsis Labs: No results for input(s): "PROCALCITON", "LATICACIDVEN" in the last 168 hours.  Recent Results (from the past 240 hour(s))  Surgical pcr screen     Status: None   Collection Time: 04/27/22  8:55 AM   Specimen: Nasal Mucosa; Nasal Swab  Result Value Ref Range Status   MRSA, PCR NEGATIVE NEGATIVE Final   Staphylococcus aureus NEGATIVE NEGATIVE Final    Comment: (NOTE) The Xpert SA Assay (FDA approved for NASAL specimens in  patients 24 years of age and older), is one component of a comprehensive surveillance program. It is not intended to diagnose infection nor to guide or monitor treatment. Performed at [facility], [place] [address], [place], [place] [address]   Urine Culture     Status: None   Collection Time: 04/28/22  3:22 PM   Specimen: Urine, Clean Catch  Result Value Ref Range Status   Specimen Description URINE, CLEAN CATCH  Final   Special Requests NONE  Final   Culture   Final    NO GROWTH Performed at [facility], [place] [address] [place], [place] [address]    Report Status 04/29/2022 FINAL  Final     Radiology Studies: No results found.  Scheduled Meds:  sodium chloride   Intravenous Once   aspirin  325 mg Oral Daily   celecoxib  200 mg Oral Daily   Chlorhexidine Gluconate Cloth  6 each Topical Daily   divalproex  250 mg Oral BID   docusate sodium  100 mg Oral BID   donepezil  5 mg Oral QHS   fentaNYL (SUBLIMAZE) injection  12.5 mcg Intravenous Once   ferrous sulfate  325 mg Oral BID WC   latanoprost  1 drop Both Eyes QHS   multivitamin  1 tablet Oral Daily   PARoxetine  10 mg Oral Daily   polyethylene glycol  17 g Oral Daily   senna-docusate  1 tablet Oral BID   sodium chloride flush  10-40 mL Intracatheter Q12H   Vitamin D (Ergocalciferol)  50,000 Units Oral Q7 days   Continuous Infusions:   LOS: 6 days   Darliss Cheney, MD [facility]  05/02/2022, 10:06 AM   *Please note that this is a verbal dictation therefore any spelling or grammatical errors are due to the "[place] One" system interpretation.  Please page via [place] and do not message via secure chat for urgent patient care matters. Secure chat can be used for non urgent patient care matters.  How to contact the [facility] Attending or Consulting provider [place] or covering provider during after hours [place], for this patient?  Check the care team in [facility] and look for a) attending/consulting TRH  provider listed and b) the [facility] team listed. Page or secure chat [phone]. Log into [email or web address] and use [place]'s universal password to access. If you do not have the  password, please contact the hospital operator. Locate the [facility] provider you are looking for under [facility] and page to a number that you can be directly reached. If you still have difficulty reaching the provider, please page the [facility] (Director on Call) for the Hospitalists listed on amion for assistance.

## 2022-05-03 DIAGNOSIS — S72141A Displaced intertrochanteric fracture of right femur, initial encounter for closed fracture: Secondary | ICD-10-CM | POA: Diagnosis not present

## 2022-05-03 MED ORDER — FERROUS SULFATE 325 (65 FE) MG PO TABS: 325.0000 mg | ORAL_TABLET | Freq: Two times a day (BID) | ORAL | 0 refills | Status: AC

## 2022-05-03 NOTE — Progress Notes (Signed)
Occupational Therapy Treatment Patient Details Name: Cassidy [name] MRN: [id number] DOB: Apr 15, 1934 Today's Date: 05/03/2022   History of present illness 88 y/o female presented to ED on 04/26/22 following fall. Sustained R femoral intertrochanteric fx. S/p cephalomedullary nail of R femur fx on 10/6. PMH: HTN, COPD, dementia   OT comments  Patient initially resting and calm, attempted gentle ROM prior to sitting EOB, unable to range or move patient due to patient behaviors.  Was able to calm patient and adjust in bed for lunch.  Setup assist for eating.  SNF level rehab trial for increased mobility, but may need to transition to LTC.     Recommendations for follow up therapy are one component of a multi-disciplinary discharge planning process, led by the attending physician.  Recommendations may be updated based on patient status, additional functional criteria and insurance authorization.    Follow Up Recommendations  Skilled nursing-short term rehab (<3 hours/day)    Assistance Recommended at Discharge Frequent or constant Supervision/Assistance  Patient can return home with the following  A lot of help with bathing/dressing/bathroom;A lot of help with walking and/or transfers   Equipment Recommendations  Wheelchair (measurements OT);Wheelchair cushion (measurements OT)    Recommendations for Other Services      Precautions / Restrictions Precautions Precautions: Fall Restrictions Weight Bearing Restrictions: Yes RLE Weight Bearing: Weight bearing as tolerated       Mobility Bed Mobility   Bed Mobility: Supine to Sit     Supine to sit: Total assist, +2 for physical assistance     General bed mobility comments: abandoned due to patient behavior    Transfers                         Balance                                                                             Cognition Arousal/Alertness: Lethargic Behavior During  Therapy: Agitated Overall Cognitive Status: History of cognitive impairments - at baseline                                                             Pertinent Vitals/ Pain       Pain Assessment Pain Assessment: Faces Faces Pain Scale: Hurts whole lot Pain Location: R LE with touching or movement Pain Descriptors / Indicators: Grimacing, Guarding, Moaning, Sharp Pain Intervention(s): Monitored during session                                                          Frequency  Min 2X/week        Progress Toward Goals  OT Goals(current goals can now be found in the care plan section)  Progress towards OT goals: Not progressing toward goals - comment  Acute Rehab OT Goals  OT Goal Formulation: Patient unable to participate in goal setting Time For Goal Achievement: 05/11/22 Potential to Achieve Goals: Good  Plan Discharge plan remains appropriate    Co-evaluation                 AM-PAC OT "6 Clicks" Daily Activity     Outcome Measure   Help from another person eating meals?: A Little Help from another person taking care of personal grooming?: A Little Help from another person toileting, which includes using toliet, bedpan, or urinal?: Total Help from another person bathing (including washing, rinsing, drying)?: A Lot Help from another person to put on and taking off regular upper body clothing?: A Lot Help from another person to put on and taking off regular lower body clothing?: Total 6 Click Score: 12    End of Session    OT Visit Diagnosis: Pain Pain - Right/Left: Right Pain - part of body: Leg   Activity Tolerance Patient limited by pain   Patient Left in bed;with call bell/phone within reach;with bed alarm set   Nurse Communication Other (comment) ([place] to reposition patient)        Time: 7510-2585 OT Time Calculation (min): 12 min  Charges: OT General Charges $OT Visit: 1 Visit OT  Treatments $Therapeutic Activity: 8-22 mins  05/03/2022  RP, OTR/L  Acute Rehabilitation Services  Office:  [phone]   Metta Clines 05/03/2022, 11:58 AM

## 2022-05-03 NOTE — Progress Notes (Signed)
Orthopaedic Trauma Progress Note  SUBJECTIVE: Doing okay today.  Pain controlled.  No chest pain. No SOB. No nausea/vomiting. No other complaints.  Exam slightly limited due to baseline dementia  OBJECTIVE:  Vitals:   05/02/22 1457 05/03/22 0547  BP: (!) 126/47 (!) 138/54  Pulse: 63 88  Resp: 18 15  Temp: 98.5 F (36.9 C) 98 F (36.7 C)  SpO2: 100%     General: Resting comfortably in bed, no acute distress Respiratory: No increased work of breathing.  Right lower extremity: Dressings clean, dry, intact.  Mildly tender over the hip as expected.  Tolerates gentle knee range of motion.  Ankle DF/PF intact.  Tolerates dorsiflexion to neutral.  Notes some stretching to the calf with this.  Able to wiggle toes.  Endorses sensation to lower extremity.  Neurovascularly intact.  IMAGING: Stable post op imaging.   LABS:  Results for orders placed or performed during the hospital encounter of 04/26/22 (from the past 24 hour(s))  CBC     Status: Abnormal   Collection Time: 05/02/22 11:21 AM  Result Value Ref Range   WBC 13.4 (H) 4.0 - 10.5 K/uL   RBC 3.00 (L) 3.87 - 5.11 MIL/uL   Hemoglobin 9.7 (L) 12.0 - 15.0 g/dL   HCT 28.3 (L) 36.0 - 46.0 %   MCV 94.3 80.0 - 100.0 fL   MCH 32.3 26.0 - 34.0 pg   MCHC 34.3 30.0 - 36.0 g/dL   RDW 15.0 11.5 - 15.5 %   Platelets 426 (H) 150 - 400 K/uL   nRBC 0.5 (H) 0.0 - 0.2 %  Basic metabolic panel     Status: Abnormal   Collection Time: 05/02/22 11:21 AM  Result Value Ref Range   Sodium 143 135 - 145 mmol/L   Potassium 4.6 3.5 - 5.1 mmol/L   Chloride 109 98 - 111 mmol/L   CO2 27 22 - 32 mmol/L   Glucose, Bld 148 (H) 70 - 99 mg/dL   BUN 22 8 - 23 mg/dL   Creatinine, Ser 0.79 0.44 - 1.00 mg/dL   Calcium 8.9 8.9 - 10.3 mg/dL   GFR, Estimated >60 >60 mL/min   Anion gap 7 5 - 15    ASSESSMENT: YONA KOSEK is a 88 y.o. female, 6 Days Post-Op s/p INTRAMEDULLARY NAILING RIGHT INTERTROCHANTERIC FEMUR FRACTURE  CV/Blood loss: Acute blood loss  anemia, 9.7 on 05/02/2022.  Received 1 unit PRBCs 05/01/2022  PLAN: Weightbearing: WBAT RLE ROM: Unrestricted ROM Incisional and dressing care: Okay to leave incisions open to air Showering: Okay to shower and get incisions wet Orthopedic device(s): None  Pain management:  1. Tylenol 650 mg q 6 hours scheduled 2. Norco 5-325 mg q 4 hours PRN 3. Fentanyl 25 mcg q 2 hours PRN 4. Celebrex 200 mg daily VTE prophylaxis: Lovenox,  SCDs ID:  Ancef 2gm post op completed Foley/Lines:  No foley, KVO IVFs Impediments to Fracture Healing: Vitamin D level 18, started on supplementation Dispo: Therapies as tolerated, PT/OT recommended SNF.  Okay for discharge from ortho standpoint once cleared by medicine team and therapies  D/C recommendations: - Norco for pain control - Aspirin 325 mg daily for DVT prophylaxis - Continue Vit D supplementation x 90 days  Follow - up plan: 2 weeks after discharge for wound check and repeat x-rays   Contact information:  Katha Hamming MD, Rushie Nyhan PA-C. After hours and holidays please check [email or web address] for group call information for [facility]   Gwinda Passe,  PA-C [phone] (office) [email or web address]

## 2022-05-03 NOTE — Progress Notes (Signed)
Report given to Crystal RN 

## 2022-05-03 NOTE — Discharge Summary (Signed)
Physician Discharge Summary  Cassidy [name] [id number] DOB: 21-Oct-1933 DOA: 04/26/2022  PCP: Patient, No Pcp Per  Admit date: 04/26/2022 Discharge date: 05/03/2022 30 Day Unplanned Readmission Risk Score    Flowsheet Row ED to Hosp-Admission (Current) from 04/26/2022 in [place]  30 Day Unplanned Readmission Risk Score (%) 9.04 Filed at 05/03/2022 0801       This score is the patient's risk of an unplanned readmission within 30 days of being discharged (0 -100%). The score is based on dignosis, age, lab data, medications, orders, and past utilization.   Low:  0-14.9   Medium: 15-21.9   High: 22-29.9   Extreme: 30 and above          Admitted From: [facility] facility via EMS Disposition: SNF  Recommendations for Outpatient Follow-up:  Follow up with PCP in 1-2 weeks Please obtain BMP/CBC in one week Follow-up with orthopedics in 2 weeks Please follow up with your PCP on the following pending results: Unresulted Labs (From admission, onward)    None         Home Health: None Equipment/Devices: None  Discharge Condition: Stable CODE STATUS: Full code Diet recommendation: Cardiac  Subjective: Seen and examined.  Patient appears to be alert but not oriented, likely her baseline since she has memory issues.  She appears comfortable.  Brief/Interim Summary: Patient is a 88 year old female with history of hypertension, COPD, osteoarthritis, dementia who presented with right hip pain from [facility] facility after a fall.  Patient was confused ,unable to obtain significant history.  She is under legal guardianship of [facility].  On presentation, she was hemodynamically stable.  Lab work showed WBC of 13.7, creatinine of 1.1.  X-ray showed right femoral intertrochanteric fracture with mild superior displacement of the distal fracture component, right knee arthroplasty with intact hardware.  CT head  did not show any acute findings.  Orthopedics consulted.  Underwent nailing of right intertrochanteric femur fracture on 10/6.  Postoperatively, she developed acute blood loss anemia requiring blood transfusion .PT/OT recommending SNF on discharge.  All arrangements have been made, she is going to be discharged to SNF in stable condition.   Leukocytosis: Likely reactive. Now resolved.   No clear signs of infection.  UA showed leukocytes, urine cultures showed no growth.  Ceftriaxone discontinued .chest x-ray did not show pneumonia..   AKI: Resolved with IV fluids   Acute blood loss normocytic anemia: Hemoglobin dropped to the range of 6 from 11 on 05/01/2022 likely associated with surgery, she received 1 unit of PRBC transfusion, hemoglobin improved over 8 and further to over 9 yesterday and has remained stable.   Right bundle branch block: EKG showed new right . 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  Last echo showed EF of 55 to [id number], grade 1 diastolic dysfunction.   Dementia with behavioral disturbance: Has dementia at baseline.  Continue Aricept, Paxil, Depakote   Vitamin D deficiency: Started on supplementation  Discharge plan was discussed with patient and/or family member and they verbalized understanding and agreed with it.  Discharge Diagnoses:  Principal Problem:   Closed intertrochanteric fracture of right hip [facility]) Active Problems:   Fall   Leukocytosis   Right bundle branch block   Dementia with behavioral disturbance (HCC)   Renal insufficiency   Closed right hip fracture [facility])    Discharge Instructions   Allergies as of 05/03/2022       Reactions  Ciprofloxacin Hcl    Present on the patient's MAR as an allergy, but she disputes it   Prednisone    Present on the patient's MAR as an allergy, but she disputes it   Zantac [ranitidine] Other (See Comments)   "Too strong," per the patient        Medication List     TAKE these medications     acetaminophen 500 MG tablet Commonly known as: TYLENOL Take 500 mg by mouth every 6 (six) hours as needed for fever, mild pain or headache.   alum & mag hydroxide-simeth 200-200-20 MG/5ML suspension Commonly known as: MAALOX/MYLANTA Take 30 mLs by mouth every 6 (six) hours as needed for indigestion or heartburn.   aspirin EC 325 MG tablet Take 1 tablet (325 mg total) by mouth daily.   celecoxib 200 MG capsule Commonly known as: CELEBREX Take 200 mg by mouth daily.   divalproex 125 MG DR tablet Commonly known as: DEPAKOTE Take 250 mg by mouth 2 (two) times daily.   donepezil 5 MG tablet Commonly known as: ARICEPT Take 5 mg by mouth at bedtime.   ferrous sulfate 325 (65 FE) MG tablet Take 1 tablet (325 mg total) by mouth 2 (two) times daily with a meal.   guaiFENesin 100 MG/5ML liquid Commonly known as: ROBITUSSIN Take 10 mLs by mouth every 6 (six) hours as needed for cough or to loosen phlegm.   HYDROcodone-acetaminophen 5-325 MG tablet Commonly known as: NORCO/VICODIN Take 1 tablet by mouth every 6 (six) hours as needed for moderate pain.   latanoprost 0.005 % ophthalmic solution Commonly known as: XALATAN Place 1 drop into both eyes at bedtime.   loperamide 2 MG capsule Commonly known as: IMODIUM Take 2 mg by mouth as needed for diarrhea or loose stools.   magnesium hydroxide 400 MG/5ML suspension Commonly known as: MILK OF MAGNESIA Take 30 mLs by mouth at bedtime as needed for mild constipation.   neomycin-bacitracin-polymyxin [phone] Oint Apply 1 Application topically as needed (minor skin tears or abrasions).   PARoxetine 10 MG tablet Commonly known as: PAXIL Take 10 mg by mouth daily.   PreserVision AREDS 2 Caps Take 1 tablet by mouth 2 (two) times daily with a meal.   Vitamin D 125 MCG (5000 UT) Caps Take 1 capsule by mouth daily.        Follow-up Information     pcp Follow up in 1 week(s).                 Allergies  Allergen  Reactions   Ciprofloxacin Hcl     Present on the patient's MAR as an allergy, but she disputes it   Prednisone     Present on the patient's MAR as an allergy, but she disputes it   Zantac [Ranitidine] Other (See Comments)    "Too strong," per the patient    Consultations: Ortho    Procedures/Studies: DG CHEST PORT 1 VIEW  Result Date: 04/28/2022 CLINICAL DATA:  Leukocytosis EXAM: PORTABLE CHEST 1 VIEW COMPARISON:  04/26/2022 FINDINGS: Minimal left base atelectasis. Right lung clear. Heart is normal size. Aortic calcifications. Mediastinal contours within normal limits. No effusions or acute bony abnormality. IMPRESSION: Minimal left base atelectasis.  No active disease. Electronically Signed   By: Rolm Baptise M.D.   On: 04/28/2022 13:22   DG HIP PORT UNILAT W OR W/O PELVIS 1V RIGHT  Result Date: 04/28/2022 CLINICAL DATA:  ORIF RIGHT femur fracture. EXAM: DG HIP (WITH OR WITHOUT PELVIS) 3V  PORT RIGHT COMPARISON:  Prior studies FINDINGS: Intramedullary rod and screws traversing an intertrochanteric RIGHT femur fracture are noted. 5 mm offset at the primary fracture line noted. Displaced lesser trochanter fragment again noted. IMPRESSION: ORIF intertrochanteric RIGHT femur fracture. Electronically Signed   By: Margarette Canada M.D.   On: 04/28/2022 09:28   DG HIP UNILAT WITH PELVIS 1V RIGHT  Result Date: 04/27/2022 CLINICAL DATA:  Imaging for right femur fracture ORIF. EXAM: OPERATIVE RIGHT HIP (WITH PELVIS IF PERFORMED) 7 VIEWS TECHNIQUE: Fluoroscopic spot image(s) were submitted for interpretation post-operatively. Fluoro time: 1 minute and 48 seconds. Dose: 16.56 mGy. COMPARISON:  04/26/2022 FINDINGS: Submitted images show placement of a short intramedullary rod supporting 2 screws, reducing the primary intertrochanteric fracture components. Fracture components are well aligned. Orthopedic hardware is well seated. IMPRESSION: Fluoroscopy provided for right proximal femur fracture ORIF.  Electronically Signed   By: Lajean Manes M.D.   On: 04/27/2022 11:37   DG C-Arm 1-60 Min-No Report  Result Date: 04/27/2022 Fluoroscopy was utilized by the requesting physician.  No radiographic interpretation.   DG Chest 1 View  Result Date: 04/26/2022 CLINICAL DATA:  Status post fall. EXAM: CHEST  1 VIEW COMPARISON:  Chest radiograph April 01, 2018. FINDINGS: Normal cardiac and mediastinal contours. Aortic atherosclerosis. Low lung volume exam. Basilar heterogeneous opacities. No pleural effusion or pneumothorax. IMPRESSION: Low lung volumes with basilar atelectasis. Electronically Signed   By: Lovey Newcomer M.D.   On: 04/26/2022 18:50   CT HEAD WO CONTRAST  Result Date: 04/26/2022 CLINICAL DATA:  Memory loss, fall. EXAM: CT HEAD WITHOUT CONTRAST TECHNIQUE: Contiguous axial images were obtained from the base of the skull through the vertex without intravenous contrast. RADIATION DOSE REDUCTION: This exam was performed according to the departmental dose-optimization program which includes automated exposure control, adjustment of the mA and/or kV according to patient size and/or use of iterative reconstruction technique. COMPARISON:  March 28, 2018. FINDINGS: Brain: No evidence of acute infarction, hemorrhage, hydrocephalus, extra-axial collection or mass lesion/mass effect. Vascular: No hyperdense vessel or unexpected calcification. Skull: Normal. Negative for fracture or focal lesion. Sinuses/Orbits: No acute finding. Other: None. IMPRESSION: No acute intracranial abnormality seen. Electronically Signed   By: Marijo Conception M.D.   On: 04/26/2022 11:26   DG Knee Complete 4 Views Right  Result Date: 04/26/2022 CLINICAL DATA:  Fall.  Pain. EXAM: PELVIS - 1-2 VIEW; RIGHT KNEE - COMPLETE 4+ VIEW; RIGHT FEMUR 2 VIEWS COMPARISON:  CT abdomen and pelvis 12/21/2016 FINDINGS: Pelvis: There is diffuse decreased bone mineralization. Mild-to-moderate bilateral femoroacetabular joint space narrowing and  superolateral acetabular degenerative osteophytosis. Acute, oblique right intertrochanteric fracture with mild superior displacement of the distal fracture component. There is medial dislocation of the lesser trochanter. Right femur: Redemonstration of right femoral intertrochanteric fracture with approximately 2 cm superior displacement of the distal fracture component. There is medial displacement of the lesser trochanter. No additional acute fracture is seen within the more distal aspect of the right femur. Right knee: Postsurgical changes of total right knee arthroplasty. No perihardware lucency is seen to indicate hardware failure or loosening. Possible small knee joint effusion. Mild chronic enthesopathic change at the quadriceps insertion on the patella. IMPRESSION: 1. Acute right femoral intertrochanteric fracture with mild superior displacement of the distal fracture component. 2. Status post total right knee arthroplasty without evidence of hardware failure. Electronically Signed   By: Yvonne Kendall M.D.   On: 04/26/2022 10:57   DG FEMUR, MIN 2 VIEWS RIGHT  Result Date:  04/26/2022 CLINICAL DATA:  Fall.  Pain. EXAM: PELVIS - 1-2 VIEW; RIGHT KNEE - COMPLETE 4+ VIEW; RIGHT FEMUR 2 VIEWS COMPARISON:  CT abdomen and pelvis 12/21/2016 FINDINGS: Pelvis: There is diffuse decreased bone mineralization. Mild-to-moderate bilateral femoroacetabular joint space narrowing and superolateral acetabular degenerative osteophytosis. Acute, oblique right intertrochanteric fracture with mild superior displacement of the distal fracture component. There is medial dislocation of the lesser trochanter. Right femur: Redemonstration of right femoral intertrochanteric fracture with approximately 2 cm superior displacement of the distal fracture component. There is medial displacement of the lesser trochanter. No additional acute fracture is seen within the more distal aspect of the right femur. Right knee: Postsurgical changes  of total right knee arthroplasty. No perihardware lucency is seen to indicate hardware failure or loosening. Possible small knee joint effusion. Mild chronic enthesopathic change at the quadriceps insertion on the patella. IMPRESSION: 1. Acute right femoral intertrochanteric fracture with mild superior displacement of the distal fracture component. 2. Status post total right knee arthroplasty without evidence of hardware failure. Electronically Signed   By: Yvonne Kendall M.D.   On: 04/26/2022 10:57   DG Pelvis 1-2 Views  Result Date: 04/26/2022 CLINICAL DATA:  Fall.  Pain. EXAM: PELVIS - 1-2 VIEW; RIGHT KNEE - COMPLETE 4+ VIEW; RIGHT FEMUR 2 VIEWS COMPARISON:  CT abdomen and pelvis 12/21/2016 FINDINGS: Pelvis: There is diffuse decreased bone mineralization. Mild-to-moderate bilateral femoroacetabular joint space narrowing and superolateral acetabular degenerative osteophytosis. Acute, oblique right intertrochanteric fracture with mild superior displacement of the distal fracture component. There is medial dislocation of the lesser trochanter. Right femur: Redemonstration of right femoral intertrochanteric fracture with approximately 2 cm superior displacement of the distal fracture component. There is medial displacement of the lesser trochanter. No additional acute fracture is seen within the more distal aspect of the right femur. Right knee: Postsurgical changes of total right knee arthroplasty. No perihardware lucency is seen to indicate hardware failure or loosening. Possible small knee joint effusion. Mild chronic enthesopathic change at the quadriceps insertion on the patella. IMPRESSION: 1. Acute right femoral intertrochanteric fracture with mild superior displacement of the distal fracture component. 2. Status post total right knee arthroplasty without evidence of hardware failure. Electronically Signed   By: Yvonne Kendall M.D.   On: 04/26/2022 10:57     Discharge Exam: Vitals:   05/02/22 1457  05/03/22 0547  BP: (!) 126/47 (!) 138/54  Pulse: 63 88  Resp: 18 15  Temp: 98.5 F (36.9 C) 98 F (36.7 C)  SpO2: 100%    Vitals:   05/02/22 0436 05/02/22 0945 05/02/22 1457 05/03/22 0547  BP: 125/82 (!) 129/49 (!) 126/47 (!) 138/54  Pulse: 94 61 63 88  Resp: 18 16 18 15   Temp:  98.9 F (37.2 C) 98.5 F (36.9 C) 98 F (36.7 C)  TempSrc:   Oral   SpO2: 92% 99% 100%   Weight:      Height:        General: Pt is alert, awake, not in acute distress Cardiovascular: RRR, S1/S2 +, no rubs, no gallops Respiratory: CTA bilaterally, no wheezing, no rhonchi Abdominal: Soft, NT, ND, bowel sounds + Extremities: no edema, no cyanosis    The results of significant diagnostics from this hospitalization (including imaging, microbiology, ancillary and laboratory) are listed below for reference.     Microbiology: Recent Results (from the past 240 hour(s))  Surgical pcr screen     Status: None   Collection Time: 04/27/22  8:55 AM   Specimen: Nasal  Mucosa; Nasal Swab  Result Value Ref Range Status   MRSA, PCR NEGATIVE NEGATIVE Final   Staphylococcus aureus NEGATIVE NEGATIVE Final    Comment: (NOTE) The Xpert SA Assay (FDA approved for NASAL specimens in patients 27 years of age and older), is one component of a comprehensive surveillance program. It is not intended to diagnose infection nor to guide or monitor treatment. Performed at [facility], [place] [address], [place], [place] [address]   Urine Culture     Status: None   Collection Time: 04/28/22  3:22 PM   Specimen: Urine, Clean Catch  Result Value Ref Range Status   Specimen Description URINE, CLEAN CATCH  Final   Special Requests NONE  Final   Culture   Final    NO GROWTH Performed at [facility], [place] [address], [place], [place] [address]    Report Status 04/29/2022 FINAL  Final     Labs: BNP (last 3 results) No results for input(s): "BNP" in the last 8760 hours. Basic Metabolic Panel: Recent Labs   Lab 04/26/22 1009 04/28/22 0221 04/29/22 0322 05/02/22 1121  NA 136 137 140 143  K 4.4 4.4 4.0 4.6  CL 102 103 108 109  CO2 21* 23 27 27   GLUCOSE 122* 131* 108* 148*  BUN 25* 31* 21 22  CREATININE 1.12* 1.18* 0.73 0.79  CALCIUM 9.5 8.5* 8.3* 8.9   Liver Function Tests: Recent Labs  Lab 04/26/22 1009  AST 31  ALT 21  ALKPHOS 45  BILITOT 0.5  PROT 6.6  ALBUMIN 3.3*   No results for input(s): "LIPASE", "AMYLASE" in the last 168 hours. No results for input(s): "AMMONIA" in the last 168 hours. CBC: Recent Labs  Lab 04/26/22 1009 04/27/22 1647 04/28/22 0221 04/29/22 0322 04/30/22 0240 05/01/22 0238 05/01/22 1315 05/02/22 1121  WBC 13.7*   < > 15.2* 11.2* 11.9* 9.6  --  13.4*  NEUTROABS 9.2*  --   --   --   --   --   --   --   HGB 11.9*   < > 7.4* 6.7* 6.7* 6.2* 8.5* 9.7*  HCT 35.4*   < > 21.4* 19.8* 18.4* 17.5* 24.3* 28.3*  MCV 97.0   < > 100.9* 98.0 100.5* 98.9  --  94.3  PLT 378   < > 252 210 280 311  --  426*   < > = values in this interval not displayed.   Cardiac Enzymes: No results for input(s): "CKTOTAL", "CKMB", "CKMBINDEX", "TROPONINI" in the last 168 hours. BNP: Invalid input(s): "POCBNP" CBG: Recent Labs  Lab 04/26/22 1419  GLUCAP 140*   D-Dimer No results for input(s): "DDIMER" in the last 72 hours. Hgb A1c No results for input(s): "HGBA1C" in the last 72 hours. Lipid Profile No results for input(s): "CHOL", "HDL", "LDLCALC", "TRIG", "CHOLHDL", "LDLDIRECT" in the last 72 hours. Thyroid function studies No results for input(s): "TSH", "T4TOTAL", "T3FREE", "THYROIDAB" in the last 72 hours.  Invalid input(s): "FREET3" Anemia work up No results for input(s): "VITAMINB12", "FOLATE", "FERRITIN", "TIBC", "IRON", "RETICCTPCT" in the last 72 hours. Urinalysis    Component Value Date/Time   COLORURINE YELLOW 04/28/2022 1434   APPEARANCEUR HAZY (A) 04/28/2022 1434   LABSPEC 1.021 04/28/2022 1434   PHURINE 5.0 04/28/2022 1434   GLUCOSEU NEGATIVE  04/28/2022 1434   HGBUR NEGATIVE 04/28/2022 1434   BILIRUBINUR NEGATIVE 04/28/2022 1434   KETONESUR 5 (A) 04/28/2022 1434   PROTEINUR NEGATIVE 04/28/2022 1434   NITRITE NEGATIVE 04/28/2022  [place] (A) 04/28/2022 1434   Sepsis Labs Recent Labs  Lab 04/29/22 0322 04/30/22 0240 05/01/22 0238 05/02/22 1121  WBC 11.2* 11.9* 9.6 13.4*   Microbiology Recent Results (from the past 240 hour(s))  Surgical pcr screen     Status: None   Collection Time: 04/27/22  8:55 AM   Specimen: Nasal Mucosa; Nasal Swab  Result Value Ref Range Status   MRSA, PCR NEGATIVE NEGATIVE Final   Staphylococcus aureus NEGATIVE NEGATIVE Final    Comment: (NOTE) The Xpert SA Assay (FDA approved for NASAL specimens in patients 79 years of age and older), is one component of a comprehensive surveillance program. It is not intended to diagnose infection nor to guide or monitor treatment. Performed at [facility], [place] [address] [place], [place] [address]   Urine Culture     Status: None   Collection Time: 04/28/22  3:22 PM   Specimen: Urine, Clean Catch  Result Value Ref Range Status   Specimen Description URINE, CLEAN CATCH  Final   Special Requests NONE  Final   Culture   Final    NO GROWTH Performed at [facility], [place] [address] [place], [place] [address]    Report Status 04/29/2022 FINAL  Final     Time coordinating discharge: Over 30 minutes  SIGNED:   Darliss Cheney, MD  [facility] 05/03/2022, 9:35 AM *Please note that this is a verbal dictation therefore any spelling or grammatical errors are due to the "[employer]" system interpretation. If 7PM-7AM, please contact night-coverage [email or web address]

## 2022-05-03 NOTE — TOC CAGE-AID Note (Signed)
Transition of Care [facility]) - CAGE-AID Screening   Patient Details  Name: Cassidy [name] MRN: [id number] Date of Birth: 1933/08/15  Transition of Care [facility]) CM/SW Contact:    Army Melia, RN Phone Number:[phone] 05/03/2022, 6:16 AM   Clinical Narrative:  Hx dementia. No hx of drug or alcohol use documented.   CAGE-AID Screening: Substance Abuse Screening unable to be completed due to: : Patient unable to participate (hx dementia)

## 2022-05-03 NOTE — TOC Progression Note (Addendum)
Transition of Care [facility]) - Progression Note    Patient Details  Name: Cassidy [name] MRN: [id number] Date of Birth: [birth year]  Transition [facility] Care [facility]) CM/SW Contact  Joanne Chars, LCSW Phone Number: 05/03/2022, 10:48 AM  Clinical Narrative:   CSW LM with [name]/legal guardian.  Update that no bed offer from [facility], but one new bed offer from [facility].    1115: Legal guardian accepts offer from [facility].  CSW spoke with Tonya/[facility]--they can accept pt today.  MD informed.   Expected Discharge Plan: [place] Barriers to Discharge: Continued Medical Work up, SNF Pending bed offer  Expected Discharge Plan and Services Expected Discharge Plan: [place] In-house Referral: Clinical Social Work   Post Acute Care Choice: [place] Living arrangements for the past 2 months: [place] ([place])                                       Social Determinants of Health (SDOH) Interventions    Readmission Risk Interventions     No data to display

## 2022-05-03 NOTE — Plan of Care (Signed)

## 2022-05-03 NOTE — TOC Transition Note (Addendum)
Transition of Care [facility]) - CM/SW Discharge Note   Patient Details  Name: ETHNE JEON MRN: [id number] Date of Birth: [birth year]  Transition of Care [facility]) CM/SW Contact:  Joanne Chars, LCSW Phone Number: 05/03/2022, 11:51 AM   Clinical Narrative:   Pt discharging to [facility], room 60A.  RN call report to [phone].    Final next level of care: [place] Barriers to Discharge: Barriers Resolved   Patient Goals and CMS Choice     Choice offered to / list presented to : [facility] POA / Guardian ([place], [facility])  Discharge Placement              Patient chooses bed at:  [facility]) Patient to be transferred to facility by: [place] Name of family member notified: legal guardian Rykiel Turner/[place] [facility] Patient and family notified of of transfer: 05/03/22  Discharge Plan and Services In-house Referral: Clinical Social Work   Post Acute Care Choice: [place]                               Social Determinants of Health (SDOH) Interventions     Readmission Risk Interventions     No data to display

## 2022-06-04 ENCOUNTER — Emergency Department: Payer: Medicare Other

## 2022-06-04 ENCOUNTER — Encounter: Payer: Self-pay | Admitting: Emergency Medicine

## 2022-06-04 ENCOUNTER — Emergency Department
Admission: EM | Admit: 2022-06-04 | Discharge: 2022-06-05 | Disposition: A | Discharge status: Skilled Nursing Facility | Payer: Medicare Other | Attending: Emergency Medicine | Admitting: Emergency Medicine

## 2022-06-04 ENCOUNTER — Other Ambulatory Visit: Payer: Self-pay

## 2022-06-04 DIAGNOSIS — R0789 Other chest pain: Secondary | ICD-10-CM | POA: Diagnosis present

## 2022-06-04 DIAGNOSIS — R410 Disorientation, unspecified: Secondary | ICD-10-CM | POA: Insufficient documentation

## 2022-06-04 DIAGNOSIS — R197 Diarrhea, unspecified: Secondary | ICD-10-CM | POA: Diagnosis present

## 2022-06-04 DIAGNOSIS — R109 Unspecified abdominal pain: Secondary | ICD-10-CM | POA: Diagnosis present

## 2022-06-04 DIAGNOSIS — N289 Disorder of kidney and ureter, unspecified: Secondary | ICD-10-CM

## 2022-06-04 DIAGNOSIS — R42 Dizziness and giddiness: Secondary | ICD-10-CM

## 2022-06-04 DIAGNOSIS — I951 Orthostatic hypotension: Secondary | ICD-10-CM | POA: Diagnosis present

## 2022-06-04 DIAGNOSIS — W19XXXA Unspecified fall, initial encounter: Secondary | ICD-10-CM | POA: Diagnosis present

## 2022-06-04 DIAGNOSIS — I10 Essential (primary) hypertension: Secondary | ICD-10-CM | POA: Diagnosis present

## 2022-06-04 DIAGNOSIS — F03918 Unspecified dementia, unspecified severity, with other behavioral disturbance: Secondary | ICD-10-CM | POA: Diagnosis present

## 2022-06-04 DIAGNOSIS — R079 Chest pain, unspecified: Secondary | ICD-10-CM

## 2022-06-04 DIAGNOSIS — J9601 Acute respiratory failure with hypoxia: Secondary | ICD-10-CM | POA: Diagnosis present

## 2022-06-04 LAB — CBC
HCT: 35.5 % — ABNORMAL LOW (ref 36.0–46.0)
Hemoglobin: 12 g/dL (ref 12.0–15.0)
MCH: 32.4 pg (ref 26.0–34.0)
MCHC: 33.8 g/dL (ref 30.0–36.0)
MCV: 95.9 fL (ref 80.0–100.0)
Platelets: 301 10*3/uL (ref 150–400)
RBC: 3.7 MIL/uL — ABNORMAL LOW (ref 3.87–5.11)
RDW: 14.3 % (ref 11.5–15.5)
WBC: 11.9 10*3/uL — ABNORMAL HIGH (ref 4.0–10.5)
nRBC: 0 % (ref 0.0–0.2)

## 2022-06-04 LAB — COMPREHENSIVE METABOLIC PANEL WITH GFR
ALT: 14 U/L (ref 0–44)
AST: 22 U/L (ref 15–41)
Albumin: 3.2 g/dL — ABNORMAL LOW (ref 3.5–5.0)
Alkaline Phosphatase: 96 U/L (ref 38–126)
Anion gap: 7 (ref 5–15)
BUN: 26 mg/dL — ABNORMAL HIGH (ref 8–23)
CO2: 25 mmol/L (ref 22–32)
Calcium: 9.3 mg/dL (ref 8.9–10.3)
Chloride: 106 mmol/L (ref 98–111)
Creatinine, Ser: 0.94 mg/dL (ref 0.44–1.00)
GFR, Estimated: 59 mL/min — ABNORMAL LOW
Glucose, Bld: 96 mg/dL (ref 70–99)
Potassium: 3.7 mmol/L (ref 3.5–5.1)
Sodium: 138 mmol/L (ref 135–145)
Total Bilirubin: 0.6 mg/dL (ref 0.3–1.2)
Total Protein: 6.3 g/dL — ABNORMAL LOW (ref 6.5–8.1)

## 2022-06-04 LAB — TROPONIN I (HIGH SENSITIVITY)
Troponin I (High Sensitivity): 7 ng/L
Troponin I (High Sensitivity): 8 ng/L

## 2022-06-04 MED ORDER — SUCRALFATE 1 GM/10ML PO SUSP
1.0000 g | Freq: Four times a day (QID) | ORAL | Status: DC
  Filled 2022-06-04 (×2): qty 10

## 2022-06-04 MED ORDER — PANTOPRAZOLE SODIUM 40 MG IV SOLR: 40.0000 mg | Freq: Two times a day (BID) | INTRAVENOUS | Status: DC

## 2022-06-04 NOTE — ED Provider Triage Note (Signed)
Emergency Medicine Provider Triage Evaluation Note  Cassidy [name] , a 88 y.o. female  was evaluated in triage.  Pt complains of reported chest pain. Patient with dementia and can not answer questions appropriately at this time but denies chest pain when asked. Per EMS CP at long term care facility. No other reported complaints.  Review of Systems  Positive: CP Negative:   Physical Exam  BP (!) 123/110 Comment: Pt shaking and moving around. attempted BP x 4  Pulse 69   Temp 98.2 F (36.8 C) (Oral)   Resp 16   SpO2 95%  Gen:   Awake, no distress   Resp:  Normal effort  MSK:   Moves extremities without difficulty  Other:  Patient pleasantly demented, non-contributory to HX  Medical Decision Making  Medically screening exam initiated at 4:30 PM.  Appropriate orders placed.  Cassidy [name] was informed that the remainder of the evaluation will be completed by another provider, this initial triage assessment does not replace that evaluation, and the importance of remaining in the ED until their evaluation is complete.  Reported CP at long term care facility. No c/o currently. Will order labs, EKG, chest xray   Cassidy Patches, PA-C 06/04/22 1630

## 2022-06-04 NOTE — Assessment & Plan Note (Signed)
Orthostatic vitals. Fall precautions.

## 2022-06-04 NOTE — Assessment & Plan Note (Signed)
Vitals:   06/04/22 1616 06/04/22 2108  BP: (!) 123/110 (!) 137/56  Blood pressure meds.  Blood pressure is within normal limits.

## 2022-06-04 NOTE — Assessment & Plan Note (Signed)
Lab Results  Component Value Date   CREATININE 0.94 06/04/2022   CREATININE 0.79 05/02/2022   CREATININE 0.73 04/29/2022  History of renal insufficiency has resolved. We will follow.

## 2022-06-04 NOTE — Hospital Course (Signed)
SOB/ COPD on bipap at 40% fio2.  Hypoxic respiratory failure. Elevated TNI. No chest pain. Shows [facility] for COPD.  In October pt was  not given blood at [facility].  [place]/ BC powder/ NSAIDS'S: NONE.

## 2022-06-04 NOTE — ED Triage Notes (Signed)
First Nurse Note:  Arrives from [facility] by [facility]>  Patient c/o chest pain earlier today, denies current chest pain.  VS wnl.  324 mg ASA given PTA

## 2022-06-04 NOTE — Assessment & Plan Note (Signed)
Attirbute to Chronic blood loss.  We will

## 2022-06-04 NOTE — Assessment & Plan Note (Signed)
Vitals:   06/04/22 1614 06/04/22 1616 06/04/22 2108  BP:  (!) 123/110 Comment: Pt shaking and moving around. attempted BP x 4 (!) 137/56  Pulse: 69  79  Temp:  98.2 F (36.8 C) 98.5 F (36.9 C)  Resp: 16  (!) 0  SpO2: 95%  97%  TempSrc:  Oral Oral  SpO2: 97 % On bipap. ABG pending. Dimer pending. TNI negative. Last echo in 2017 normal EF. BNP pending.  ? If this is gerd/PUD related asthma . IV PPI. Supplemental oxygen.

## 2022-06-04 NOTE — Assessment & Plan Note (Signed)
Vitals:   06/04/22 1616 06/04/22 2108  BP: (!) 123/110 (!) 137/56

## 2022-06-04 NOTE — ED Triage Notes (Signed)
Pt to ED via [facility] from [facility] for chest pain. Pt has a history of dementia and is unable to provide any details of why she is here.

## 2022-06-04 NOTE — ED Provider Notes (Signed)
[facility] Provider Note   Event Date/Time   First MD Initiated Contact with Patient 06/04/22 2020     (approximate) History  Chest Pain  HPI Cassidy [name] is a 88 y.o. female coming from her LTC facility complaining of abdominal pain while eating.  Patient is oriented to place and situation stating that she came in for chest pain but does not remember exactly how it felt.  Patient states that this sort of chest pain has been going on for "a long time" but denies ever having seen a cardiologist in the past.  Further history and review of systems are unable to be obtained at this time secondary to patient confusion which is baseline per EMS staff   Physical Exam  Triage Vital Signs: ED Triage Vitals  Enc Vitals Group     BP 06/04/22 1616 (!) 123/110     Pulse Rate 06/04/22 1614 69     Resp 06/04/22 1614 16     Temp 06/04/22 1616 98.2 F (36.8 C)     Temp Source 06/04/22 1616 Oral     SpO2 06/04/22 1614 95 %     Weight --      Height --      Head Circumference --      Peak Flow --      Pain Score --      Pain Loc --      Pain Edu? --      Excl. in [place]? --    Most recent vital signs: Vitals:   06/04/22 1614 06/04/22 1616  BP:  (!) 123/110  Pulse: 69   Resp: 16   Temp:  98.2 F (36.8 C)  SpO2: 95%    General: Awake, oriented x1 CV:  Good peripheral perfusion.  Resp:  Normal effort.  Abd:  No distention.  Other:  Elderly Caucasian female laying in bed in no acute distress ED Results / Procedures / Treatments  Labs (all labs ordered are listed, but only abnormal results are displayed) Labs Reviewed  CBC - Abnormal; Notable for the following components:      Result Value   WBC 11.9 (*)    RBC 3.70 (*)    HCT 35.5 (*)    All other components within normal limits  COMPREHENSIVE METABOLIC PANEL - Abnormal; Notable for the following components:   BUN 26 (*)    Total Protein 6.3 (*)    Albumin 3.2 (*)    GFR, Estimated 59 (*)    All other  components within normal limits  TROPONIN I (HIGH SENSITIVITY)  TROPONIN I (HIGH SENSITIVITY)   EKG ED ECG REPORT I, Merwyn Katos, the attending physician, personally viewed and interpreted this ECG. Date: 06/04/2022 EKG Time: 1623 Rate: 76 Rhythm: normal sinus rhythm QRS Axis: normal Intervals: Bifascicular block ST/T Wave abnormalities: normal Narrative Interpretation: Normal sinus rhythm with bifascicular block.  No evidence of acute ischemia RADIOLOGY ED MD interpretation: One-view portable chest x-ray interpreted by me shows no evidence of acute abnormalities including no pneumonia, pneumothorax, or widened mediastinum -Agree with radiology assessment Official radiology report(s): DG Chest 1 View  Result Date: 06/04/2022 CLINICAL DATA:  [id number] Chest pain [id number] EXAM: CHEST  1 VIEW COMPARISON:  Chest x-ray 04/28/2022 FINDINGS: The heart and mediastinal contours are unchanged. Aortic calcification. No focal consolidation. Chronic coarsened markings with no overt pulmonary edema. No pleural effusion. No pneumothorax. No acute osseous abnormality. Degenerative changes of the left shoulder. IMPRESSION: 1. No active  disease. 2.  Aortic Atherosclerosis (ICD10-I70.0). Electronically Signed   By: Tish Frederickson M.D.   On: 06/04/2022 17:06   PROCEDURES: Critical Care performed: No .1-3 Lead EKG Interpretation  Performed by: Merwyn Katos, MD Authorized by: Merwyn Katos, MD     Interpretation: normal     ECG rate:  67   ECG rate assessment: normal     Rhythm: sinus rhythm     Ectopy: none     Conduction: normal    MEDICATIONS ORDERED IN ED: Medications - No data to display IMPRESSION / MDM / ASSESSMENT AND PLAN / ED COURSE  I reviewed the triage vital signs and the nursing notes.                             The patient is on the cardiac monitor to evaluate for evidence of arrhythmia and/or significant heart rate changes. Patient's presentation is most consistent with  acute presentation with potential threat to life or bodily function. Workup: ECG, CXR, CBC, BMP, Troponin Findings: ECG: No overt evidence of STEMI. No evidence of Brugadas sign, delta wave, epsilon wave, significantly prolonged QTc, or malignant arrhythmia HS Troponin: Negative x1 Other Labs unremarkable for emergent problems. CXR: Without PTX, PNA, or widened mediastinum Last Stress Test:  2017 Last Heart Catheterization:  never HEART Score: 4  Given History, Exam, and Workup I have low suspicion for ACS, Pneumothorax, Pneumonia, Pulmonary Embolus, Tamponade, Aortic Dissection or other emergent problem as a cause for this presentation.   Reassesment: Prior to discharge patients pain was controlled and they were well appearing.  Disposition:  Discharge. Strict return precautions discussed with patient with full understanding. Advised patient to follow up promptly with primary care provider   FINAL CLINICAL IMPRESSION(S) / ED DIAGNOSES   Final diagnoses:  Chest pain, unspecified type   Rx / DC Orders   ED Discharge Orders          Ordered    Ambulatory referral to Cardiology       Comments: If you have not heard from the Cardiology office within the next 72 hours please call [phone].   06/04/22 2039           Note:  This document was prepared using Dragon voice recognition software and may include unintentional dictation errors.   Merwyn Katos, MD 06/04/22 [phone]

## 2022-06-04 NOTE — Assessment & Plan Note (Signed)
Attribute to esophagitis/gastritis/duodenitis/PUD. IV PPI therapy twice daily in addition to Carafate.

## 2022-06-04 NOTE — Assessment & Plan Note (Signed)
Suspect fall from orthostatic hypotension from blood loss and chronic bleeding from blood loss anemia. Fall precautions and orthostatic vitals.

## 2022-06-04 NOTE — Assessment & Plan Note (Signed)
Continue aricept 

## 2022-10-02 ENCOUNTER — Emergency Department
Admission: EM | Admit: 2022-10-02 | Discharge: 2022-10-02 | Disposition: A | Discharge status: Skilled Nursing Facility | Payer: Medicare Other | Attending: Emergency Medicine | Admitting: Emergency Medicine

## 2022-10-02 ENCOUNTER — Other Ambulatory Visit: Payer: Self-pay

## 2022-10-02 DIAGNOSIS — F03C11 Unspecified dementia, severe, with agitation: Secondary | ICD-10-CM | POA: Diagnosis not present

## 2022-10-02 DIAGNOSIS — R400 Somnolence: Secondary | ICD-10-CM | POA: Diagnosis present

## 2022-10-02 DIAGNOSIS — I10 Essential (primary) hypertension: Secondary | ICD-10-CM | POA: Insufficient documentation

## 2022-10-02 LAB — CBC WITH DIFFERENTIAL/PLATELET
Abs Immature Granulocytes: 0.07 10*3/uL (ref 0.00–0.07)
Basophils Absolute: 0 10*3/uL (ref 0.0–0.1)
Basophils Relative: 0 %
Eosinophils Absolute: 0 10*3/uL (ref 0.0–0.5)
Eosinophils Relative: 0 %
HCT: 35.6 % — ABNORMAL LOW (ref 36.0–46.0)
Hemoglobin: 11.9 g/dL — ABNORMAL LOW (ref 12.0–15.0)
Immature Granulocytes: 1 %
Lymphocytes Relative: 16 %
Lymphs Abs: 1.9 10*3/uL (ref 0.7–4.0)
MCH: 32.9 pg (ref 26.0–34.0)
MCHC: 33.4 g/dL (ref 30.0–36.0)
MCV: 98.3 fL (ref 80.0–100.0)
Monocytes Absolute: 0.9 10*3/uL (ref 0.1–1.0)
Monocytes Relative: 8 %
Neutro Abs: 8.5 10*3/uL — ABNORMAL HIGH (ref 1.7–7.7)
Neutrophils Relative %: 75 %
Platelets: 315 10*3/uL (ref 150–400)
RBC: 3.62 MIL/uL — ABNORMAL LOW (ref 3.87–5.11)
RDW: 13 % (ref 11.5–15.5)
WBC: 11.3 10*3/uL — ABNORMAL HIGH (ref 4.0–10.5)
nRBC: 0 % (ref 0.0–0.2)

## 2022-10-02 LAB — BASIC METABOLIC PANEL
Anion gap: 12 (ref 5–15)
BUN: 28 mg/dL — ABNORMAL HIGH (ref 8–23)
CO2: 25 mmol/L (ref 22–32)
Calcium: 9.4 mg/dL (ref 8.9–10.3)
Chloride: 104 mmol/L (ref 98–111)
Creatinine, Ser: 0.91 mg/dL (ref 0.44–1.00)
GFR, Estimated: >60 mL/min (ref >60)
Glucose, Bld: 122 mg/dL — ABNORMAL HIGH (ref 70–99)
Potassium: 3.7 mmol/L (ref 3.5–5.1)
Sodium: 141 mmol/L (ref 135–145)

## 2022-10-02 MED ORDER — SODIUM CHLORIDE 0.9 % IV BOLUS
500.0000 mL | Freq: Once | INTRAVENOUS | Status: AC
  Administered 2022-10-02: 500 mL via INTRAVENOUS

## 2022-10-02 NOTE — Discharge Instructions (Signed)
Labs today were unremarkable please follow-up with your primary care doctor for further evaluation.  Continue taking all medications as usual.
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
## 2022-10-02 NOTE — ED Triage Notes (Signed)
Pt to ED via [facility] from [facility] for a possible overdose and loss of consciousness. Staff stated pt unconscious, but immediately awoke with sternal rub. Pin point pupils. Pt anxious and combative and would not allow EMS to get vitals. At baseline per staff.  Hx dementia

## 2022-10-02 NOTE — ED Notes (Signed)
Report called to [facility] with [facility]

## 2022-10-06 ENCOUNTER — Emergency Department: Payer: Medicare Other

## 2022-10-06 ENCOUNTER — Other Ambulatory Visit: Payer: Self-pay

## 2022-10-06 ENCOUNTER — Emergency Department
Admission: EM | Admit: 2022-10-06 | Discharge: 2022-10-06 | Disposition: A | Discharge status: Skilled Nursing Facility | Payer: Medicare Other | Attending: Emergency Medicine | Admitting: Emergency Medicine

## 2022-10-06 DIAGNOSIS — R451 Restlessness and agitation: Secondary | ICD-10-CM | POA: Insufficient documentation

## 2022-10-06 DIAGNOSIS — R4182 Altered mental status, unspecified: Secondary | ICD-10-CM | POA: Insufficient documentation

## 2022-10-06 DIAGNOSIS — R404 Transient alteration of awareness: Secondary | ICD-10-CM | POA: Diagnosis not present

## 2022-10-06 DIAGNOSIS — I10 Essential (primary) hypertension: Secondary | ICD-10-CM | POA: Insufficient documentation

## 2022-10-06 DIAGNOSIS — F0390 Unspecified dementia without behavioral disturbance: Secondary | ICD-10-CM | POA: Insufficient documentation

## 2022-10-06 DIAGNOSIS — Z1152 Encounter for screening for COVID-19: Secondary | ICD-10-CM | POA: Insufficient documentation

## 2022-10-06 DIAGNOSIS — R7309 Other abnormal glucose: Secondary | ICD-10-CM | POA: Diagnosis not present

## 2022-10-06 LAB — COMPREHENSIVE METABOLIC PANEL
ALT: 8 U/L (ref 0–44)
AST: 21 U/L (ref 15–41)
Albumin: 2.5 g/dL — ABNORMAL LOW (ref 3.5–5.0)
Alkaline Phosphatase: 34 U/L — ABNORMAL LOW (ref 38–126)
Anion gap: 5 (ref 5–15)
BUN: 25 mg/dL — ABNORMAL HIGH (ref 8–23)
CO2: 20 mmol/L — ABNORMAL LOW (ref 22–32)
Calcium: 6.8 mg/dL — ABNORMAL LOW (ref 8.9–10.3)
Chloride: 112 mmol/L — ABNORMAL HIGH (ref 98–111)
Creatinine, Ser: 0.81 mg/dL (ref 0.44–1.00)
GFR, Estimated: >60 mL/min (ref >60)
Glucose, Bld: 125 mg/dL — ABNORMAL HIGH (ref 70–99)
Potassium: 3.3 mmol/L — ABNORMAL LOW (ref 3.5–5.1)
Sodium: 137 mmol/L (ref 135–145)
Total Bilirubin: 0.9 mg/dL (ref 0.3–1.2)
Total Protein: 5 g/dL — ABNORMAL LOW (ref 6.5–8.1)

## 2022-10-06 LAB — CBC
HCT: 31.2 % — ABNORMAL LOW (ref 36.0–46.0)
Hemoglobin: 9.9 g/dL — ABNORMAL LOW (ref 12.0–15.0)
MCH: 33 pg (ref 26.0–34.0)
MCHC: 31.7 g/dL (ref 30.0–36.0)
MCV: 104 fL — ABNORMAL HIGH (ref 80.0–100.0)
Platelets: 221 10*3/uL (ref 150–400)
RBC: 3 MIL/uL — ABNORMAL LOW (ref 3.87–5.11)
RDW: 13.1 % (ref 11.5–15.5)
WBC: 12.4 10*3/uL — ABNORMAL HIGH (ref 4.0–10.5)
nRBC: 0 % (ref 0.0–0.2)

## 2022-10-06 LAB — VALPROIC ACID LEVEL: Valproic Acid Lvl: 15 ug/mL — ABNORMAL LOW (ref 50.0–100.0)

## 2022-10-06 LAB — RESP PANEL BY RT-PCR (RSV, FLU A&B, COVID)  RVPGX2
Influenza A by PCR: NEGATIVE
Influenza B by PCR: NEGATIVE
Resp Syncytial Virus by PCR: NEGATIVE
SARS Coronavirus 2 by RT PCR: NEGATIVE

## 2022-10-06 LAB — CBG MONITORING, ED: Glucose-Capillary: 139 mg/dL — ABNORMAL HIGH (ref 70–99)

## 2022-10-06 LAB — ETHANOL: Alcohol, Ethyl (B): <10 mg/dL (ref <10)

## 2022-10-06 LAB — ACETAMINOPHEN LEVEL: Acetaminophen (Tylenol), Serum: <10 ug/mL — ABNORMAL LOW (ref 10–30)

## 2022-10-06 LAB — SALICYLATE LEVEL: Salicylate Lvl: <7 mg/dL — ABNORMAL LOW (ref 7.0–30.0)

## 2022-10-06 MED ORDER — HALOPERIDOL LACTATE 5 MG/ML IJ SOLN
2.5000 mg | Freq: Once | INTRAMUSCULAR | Status: AC
  Administered 2022-10-06: 2.5 mg via INTRAVENOUS
  Filled 2022-10-06: qty 1

## 2022-10-06 MED ORDER — SODIUM CHLORIDE 0.9 % IV BOLUS
1000.0000 mL | Freq: Once | INTRAVENOUS | Status: AC
  Administered 2022-10-06: 1000 mL via INTRAVENOUS
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
## 2022-10-06 NOTE — ED Notes (Signed)
Pt is more alert and more at her baseline from reading previous notes.

## 2022-10-06 NOTE — ED Notes (Signed)
Multiple IV attempts no success. Will place IV consult.

## 2022-10-06 NOTE — Discharge Instructions (Signed)
Your symptoms, improvement with Narcan, and otherwise reassuring evaluation suggest that today's episode was due to opioid intoxication.  Please discontinue use of tramadol which may be causing the symptoms.  Please be watchful that the patient is not given any other opioids by staff or visitors.
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
## 2022-10-06 NOTE — ED Notes (Signed)
Pt placed in safety mitts at this time.

## 2022-10-06 NOTE — ED Provider Notes (Signed)
[facility] Provider Note    Event Date/Time   First MD Initiated Contact with Patient 10/06/22 1501     (approximate)   History   Chief Complaint: Altered Mental Status   HPI  Cassidy [name] is a 89 y.o. female with a history of hypertension, advanced dementia, right bundle wrench block who is sent to the ED due to unresponsiveness at [facility].  Reportedly she was noticed to be somnolent there and EMS was called.  They noticed pinpoint pupils and gave 4 mg of Narcan with normalization of her mental status.  Patient is not able to meaningfully provide any history due to her dementia.  She is somewhat agitated, speaking incoherently     Physical Exam   Triage Vital Signs: ED Triage Vitals  Enc Vitals Group     BP 10/06/22 1500 (!) 90/50     Pulse Rate 10/06/22 1456 90     Resp 10/06/22 1456 14     Temp 10/06/22 1500 98 F (36.7 C)     Temp Source 10/06/22 1500 Oral     SpO2 10/06/22 1456 96 %     Weight 10/06/22 1456 132 lb 4.4 oz (60 kg)     Height 10/06/22 1456 5' 4.5" (1.638 m)     Head Circumference --      Peak Flow --      Pain Score --      Pain Loc --      Pain Edu? --      Excl. in [place]? --     Most recent vital signs: Vitals:   10/06/22 1530 10/06/22 1630  BP: (!) 101/57 (!) 142/104  Pulse: 77 77  Resp: 16 (!) 22  Temp:    SpO2: 91% 100%    General: Awake, no distress.  CV:  Good peripheral perfusion.  Regular rate and rhythm Resp:  Normal effort.  Clear to auscultation bilaterally Abd:  No distention.  Soft nontender Other:  No signs of trauma.  Pupils 3 mm bilaterally and reactive.  Dry mucous membranes.  Tenderness at the right hip, and grimaces with passive flexion of the right hip.   ED Results / Procedures / Treatments   Labs (all labs ordered are listed, but only abnormal results are displayed) Labs Reviewed  COMPREHENSIVE METABOLIC PANEL - Abnormal; Notable for the following components:      Result  Value   Potassium 3.3 (*)    Chloride 112 (*)    CO2 20 (*)    Glucose, Bld 125 (*)    BUN 25 (*)    Calcium 6.8 (*)    Total Protein 5.0 (*)    Albumin 2.5 (*)    Alkaline Phosphatase 34 (*)    All other components within normal limits  CBC - Abnormal; Notable for the following components:   WBC 12.4 (*)    RBC 3.00 (*)    Hemoglobin 9.9 (*)    HCT 31.2 (*)    MCV 104.0 (*)    All other components within normal limits  ACETAMINOPHEN LEVEL - Abnormal; Notable for the following components:   Acetaminophen (Tylenol), Serum <10 (*)    All other components within normal limits  SALICYLATE LEVEL - Abnormal; Notable for the following components:   Salicylate Lvl [id number] (*)    All other components within normal limits  VALPROIC ACID LEVEL - Abnormal; Notable for the following components:   Valproic Acid Lvl 15 (*)    All  other components within normal limits  CBG MONITORING, ED - Abnormal; Notable for the following components:   Glucose-Capillary 139 (*)    All other components within normal limits  RESP PANEL BY RT-PCR (RSV, FLU A&B, COVID)  [id number]  ETHANOL  URINE DRUG SCREEN, QUALITATIVE ([facility] ONLY)     EKG Interpreted by me Sinus rhythm rate of 82.  Left axis, right bundle branch block.  No acute ischemic changes   RADIOLOGY Chest x-ray interpreted by me, appears normal.  Radiology report reviewed.  X-ray right hip and pelvis interpreted by me, shows orthopedic hardware from recent right intertrochanteric fracture fixation without dislocation or complication.  No acute findings.  Radiology report reviewed.  CT head unremarkable  PROCEDURES:  Procedures   MEDICATIONS ORDERED IN ED: Medications  sodium chloride 0.9 % bolus 1,000 mL (0 mLs Intravenous Stopped 10/06/22 1742)  haloperidol lactate (HALDOL) injection 2.5 mg (2.5 mg Intravenous Given 10/06/22 1656)     IMPRESSION / MDM / ASSESSMENT AND PLAN / ED COURSE  I reviewed the triage vital signs and the nursing  notes.  DDx: AKI, dehydration, electrolyte abnormality, intoxication, COVID/flu, opioid intoxication, hip fracture, aspiration  Patient's presentation is most consistent with acute presentation with potential threat to life or bodily function.  Patient presents with momentary altered mental status which clinically appears to be due to opioid intoxication.  Medication list includes tramadol, and symptoms were reversed with Narcan.  PDMP and SNF MAR does not list any other opioids.  Will give IV fluids for hydration check labs, x-ray, CT head.   ----------------------------------------- 6:47 PM on 10/06/2022 ----------------------------------------- Workup entirely unremarkable.  Patient appears to be back to her baseline mental status in the ED and has remained such during observation and workup.  Stable for discharge back to her skilled nursing facility.      FINAL CLINICAL IMPRESSION(S) / ED DIAGNOSES   Final diagnoses:  Transient alteration of awareness     Rx / DC Orders   ED Discharge Orders     None        Note:  This document was prepared using Dragon voice recognition software and may include unintentional dictation errors.   Carrie Mew, MD 10/06/22 [phone]

## 2022-10-06 NOTE — ED Notes (Signed)
Report called to [facility] member [place]. They accepted report for transfer back to their facility.
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
## 2022-10-06 NOTE — Progress Notes (Signed)
Airway Standby for possible intubation.

## 2022-10-06 NOTE — ED Triage Notes (Signed)
BIB by [facility] from [facility] for weakness. Unresponsive for EMS pinpoin pupils. 4Mg  of Narcan administered.  20 RFA 72/20 68/30  Pt was just seen here for same on 10/02/22
